# Patient Record
Sex: Female | Born: 1935 | Race: White | Hispanic: No | Smoking: Never smoker
Health system: Southern US, Community
[De-identification: ages and names within clinical notes are randomized; demographics above are authoritative.]

## PROBLEM LIST (undated history)

## (undated) DIAGNOSIS — D509 Iron deficiency anemia, unspecified: Secondary | ICD-10-CM

## (undated) DIAGNOSIS — M5090 Cervical disc disorder, unspecified, unspecified cervical region: Secondary | ICD-10-CM

## (undated) DIAGNOSIS — F411 Generalized anxiety disorder: Secondary | ICD-10-CM

## (undated) DIAGNOSIS — I10 Essential (primary) hypertension: Secondary | ICD-10-CM

## (undated) DIAGNOSIS — M25819 Other specified joint disorders, unspecified shoulder: Secondary | ICD-10-CM

## (undated) DIAGNOSIS — I872 Venous insufficiency (chronic) (peripheral): Secondary | ICD-10-CM

## (undated) DIAGNOSIS — L408 Other psoriasis: Secondary | ICD-10-CM

## (undated) DIAGNOSIS — R609 Edema, unspecified: Secondary | ICD-10-CM

## (undated) DIAGNOSIS — F329 Major depressive disorder, single episode, unspecified: Secondary | ICD-10-CM

## (undated) DIAGNOSIS — N390 Urinary tract infection, site not specified: Secondary | ICD-10-CM

## (undated) DIAGNOSIS — E039 Hypothyroidism, unspecified: Secondary | ICD-10-CM

## (undated) DIAGNOSIS — F3289 Other specified depressive episodes: Secondary | ICD-10-CM

## (undated) DIAGNOSIS — R6881 Early satiety: Secondary | ICD-10-CM

## (undated) DIAGNOSIS — N179 Acute kidney failure, unspecified: Secondary | ICD-10-CM

## (undated) DIAGNOSIS — F0280 Dementia in other diseases classified elsewhere without behavioral disturbance: Secondary | ICD-10-CM

## (undated) DIAGNOSIS — M810 Age-related osteoporosis without current pathological fracture: Secondary | ICD-10-CM

## (undated) DIAGNOSIS — G3109 Other frontotemporal dementia: Secondary | ICD-10-CM

## (undated) DIAGNOSIS — E860 Dehydration: Secondary | ICD-10-CM

## (undated) DIAGNOSIS — R64 Cachexia: Secondary | ICD-10-CM

## (undated) DIAGNOSIS — R9431 Abnormal electrocardiogram [ECG] [EKG]: Secondary | ICD-10-CM

## (undated) DIAGNOSIS — K591 Functional diarrhea: Secondary | ICD-10-CM

## (undated) DIAGNOSIS — L299 Pruritus, unspecified: Secondary | ICD-10-CM

## (undated) HISTORY — DX: Pruritus, unspecified: L29.9

## (undated) HISTORY — DX: Major depressive disorder, single episode, unspecified: F32.9

## (undated) HISTORY — DX: Functional diarrhea: K59.1

## (undated) HISTORY — DX: Iron deficiency anemia, unspecified: D50.9

## (undated) HISTORY — DX: Edema, unspecified: R60.9

## (undated) HISTORY — DX: Essential (primary) hypertension: I10

## (undated) HISTORY — DX: Urinary tract infection, site not specified: N39.0

## (undated) HISTORY — DX: Hypothyroidism, unspecified: E03.9

## (undated) HISTORY — DX: Acute kidney failure, unspecified: N17.9

## (undated) HISTORY — DX: Early satiety: R68.81

## (undated) HISTORY — DX: Cachexia: R64

## (undated) HISTORY — DX: Generalized anxiety disorder: F41.1

## (undated) HISTORY — DX: Dehydration: E86.0

## (undated) HISTORY — DX: Venous insufficiency (chronic) (peripheral): I87.2

## (undated) HISTORY — DX: Age-related osteoporosis without current pathological fracture: M81.0

## (undated) HISTORY — DX: Dementia in other diseases classified elsewhere, unspecified severity, without behavioral disturbance, psychotic disturbance, mood disturbance, and anxiety: F02.80

## (undated) HISTORY — PX: CATARACT EXTRACTION, BILATERAL: SHX1313

## (undated) HISTORY — DX: Cervical disc disorder, unspecified, unspecified cervical region: M50.90

## (undated) HISTORY — DX: Other frontotemporal dementia: G31.09

## (undated) HISTORY — DX: Other specified joint disorders, unspecified shoulder: M25.819

## (undated) HISTORY — DX: Other psoriasis: L40.8

## (undated) HISTORY — DX: Other specified depressive episodes: F32.89

## (undated) HISTORY — DX: Abnormal electrocardiogram (ECG) (EKG): R94.31

---

## 2006-07-11 DIAGNOSIS — M810 Age-related osteoporosis without current pathological fracture: Secondary | ICD-10-CM

## 2006-07-11 DIAGNOSIS — I10 Essential (primary) hypertension: Secondary | ICD-10-CM

## 2007-02-06 DIAGNOSIS — I872 Venous insufficiency (chronic) (peripheral): Secondary | ICD-10-CM

## 2008-07-03 ENCOUNTER — Emergency Department: Payer: Self-pay | Admitting: Emergency Medicine

## 2009-05-10 DIAGNOSIS — E039 Hypothyroidism, unspecified: Secondary | ICD-10-CM

## 2009-05-10 DIAGNOSIS — I8390 Asymptomatic varicose veins of unspecified lower extremity: Secondary | ICD-10-CM

## 2009-10-11 ENCOUNTER — Emergency Department: Payer: Self-pay | Admitting: Emergency Medicine

## 2009-10-19 ENCOUNTER — Emergency Department: Payer: Self-pay | Admitting: Unknown Physician Specialty

## 2010-12-02 ENCOUNTER — Inpatient Hospital Stay: Payer: Self-pay | Admitting: Specialist

## 2011-07-17 ENCOUNTER — Ambulatory Visit: Payer: Self-pay | Admitting: Family Medicine

## 2011-07-17 DIAGNOSIS — M25819 Other specified joint disorders, unspecified shoulder: Secondary | ICD-10-CM | POA: Diagnosis not present

## 2011-07-17 DIAGNOSIS — M810 Age-related osteoporosis without current pathological fracture: Secondary | ICD-10-CM | POA: Diagnosis not present

## 2011-07-17 DIAGNOSIS — M25519 Pain in unspecified shoulder: Secondary | ICD-10-CM | POA: Diagnosis not present

## 2011-07-17 DIAGNOSIS — N390 Urinary tract infection, site not specified: Secondary | ICD-10-CM | POA: Diagnosis not present

## 2011-07-17 DIAGNOSIS — E039 Hypothyroidism, unspecified: Secondary | ICD-10-CM | POA: Diagnosis not present

## 2011-08-09 ENCOUNTER — Ambulatory Visit: Payer: Self-pay | Admitting: Family Medicine

## 2011-08-09 DIAGNOSIS — M810 Age-related osteoporosis without current pathological fracture: Secondary | ICD-10-CM | POA: Diagnosis not present

## 2011-11-16 ENCOUNTER — Inpatient Hospital Stay: Payer: Self-pay | Admitting: Internal Medicine

## 2011-11-16 DIAGNOSIS — E860 Dehydration: Secondary | ICD-10-CM | POA: Diagnosis present

## 2011-11-16 DIAGNOSIS — E41 Nutritional marasmus: Secondary | ICD-10-CM | POA: Diagnosis present

## 2011-11-16 DIAGNOSIS — E876 Hypokalemia: Secondary | ICD-10-CM | POA: Diagnosis present

## 2011-11-16 DIAGNOSIS — R0602 Shortness of breath: Secondary | ICD-10-CM | POA: Diagnosis not present

## 2011-11-16 DIAGNOSIS — Z7983 Long term (current) use of bisphosphonates: Secondary | ICD-10-CM | POA: Diagnosis not present

## 2011-11-16 DIAGNOSIS — D509 Iron deficiency anemia, unspecified: Secondary | ICD-10-CM | POA: Diagnosis not present

## 2011-11-16 DIAGNOSIS — R0902 Hypoxemia: Secondary | ICD-10-CM | POA: Diagnosis not present

## 2011-11-16 DIAGNOSIS — I10 Essential (primary) hypertension: Secondary | ICD-10-CM | POA: Diagnosis present

## 2011-11-16 DIAGNOSIS — M810 Age-related osteoporosis without current pathological fracture: Secondary | ICD-10-CM | POA: Diagnosis present

## 2011-11-16 DIAGNOSIS — J189 Pneumonia, unspecified organism: Secondary | ICD-10-CM | POA: Diagnosis not present

## 2011-11-16 DIAGNOSIS — R64 Cachexia: Secondary | ICD-10-CM | POA: Diagnosis not present

## 2011-11-16 DIAGNOSIS — Z79899 Other long term (current) drug therapy: Secondary | ICD-10-CM | POA: Diagnosis not present

## 2011-11-16 DIAGNOSIS — Z7982 Long term (current) use of aspirin: Secondary | ICD-10-CM | POA: Diagnosis not present

## 2011-11-16 DIAGNOSIS — F329 Major depressive disorder, single episode, unspecified: Secondary | ICD-10-CM | POA: Diagnosis present

## 2011-11-16 DIAGNOSIS — Z8673 Personal history of transient ischemic attack (TIA), and cerebral infarction without residual deficits: Secondary | ICD-10-CM | POA: Diagnosis not present

## 2011-11-16 DIAGNOSIS — F0390 Unspecified dementia without behavioral disturbance: Secondary | ICD-10-CM | POA: Diagnosis present

## 2011-11-16 DIAGNOSIS — I509 Heart failure, unspecified: Secondary | ICD-10-CM | POA: Diagnosis not present

## 2011-11-16 DIAGNOSIS — J810 Acute pulmonary edema: Secondary | ICD-10-CM | POA: Diagnosis not present

## 2011-11-16 DIAGNOSIS — J9600 Acute respiratory failure, unspecified whether with hypoxia or hypercapnia: Secondary | ICD-10-CM | POA: Diagnosis not present

## 2011-11-16 DIAGNOSIS — Z853 Personal history of malignant neoplasm of breast: Secondary | ICD-10-CM | POA: Diagnosis not present

## 2011-11-16 DIAGNOSIS — J90 Pleural effusion, not elsewhere classified: Secondary | ICD-10-CM | POA: Diagnosis not present

## 2011-11-16 DIAGNOSIS — R091 Pleurisy: Secondary | ICD-10-CM | POA: Diagnosis present

## 2011-11-16 DIAGNOSIS — Z23 Encounter for immunization: Secondary | ICD-10-CM | POA: Diagnosis not present

## 2011-11-16 DIAGNOSIS — N6019 Diffuse cystic mastopathy of unspecified breast: Secondary | ICD-10-CM | POA: Diagnosis present

## 2011-11-16 DIAGNOSIS — E039 Hypothyroidism, unspecified: Secondary | ICD-10-CM | POA: Diagnosis present

## 2011-11-16 DIAGNOSIS — D649 Anemia, unspecified: Secondary | ICD-10-CM | POA: Diagnosis not present

## 2011-11-16 DIAGNOSIS — F411 Generalized anxiety disorder: Secondary | ICD-10-CM | POA: Diagnosis present

## 2011-11-16 DIAGNOSIS — J8410 Pulmonary fibrosis, unspecified: Secondary | ICD-10-CM | POA: Diagnosis not present

## 2011-11-16 DIAGNOSIS — R918 Other nonspecific abnormal finding of lung field: Secondary | ICD-10-CM | POA: Diagnosis not present

## 2011-11-16 DIAGNOSIS — Z820 Family history of epilepsy and other diseases of the nervous system: Secondary | ICD-10-CM | POA: Diagnosis not present

## 2011-11-16 DIAGNOSIS — R9389 Abnormal findings on diagnostic imaging of other specified body structures: Secondary | ICD-10-CM | POA: Diagnosis not present

## 2011-11-16 LAB — CK TOTAL AND CKMB (NOT AT ARMC): CK, Total: 46 U/L (ref 21–215)

## 2011-11-16 LAB — COMPREHENSIVE METABOLIC PANEL
Albumin: 2.6 g/dL — ABNORMAL LOW (ref 3.4–5.0)
Alkaline Phosphatase: 144 U/L — ABNORMAL HIGH (ref 50–136)
Anion Gap: 10 (ref 7–16)
BUN: 17 mg/dL (ref 7–18)
Bilirubin,Total: 0.5 mg/dL (ref 0.2–1.0)
Creatinine: 0.81 mg/dL (ref 0.60–1.30)
EGFR (African American): >60
EGFR (Non-African Amer.): >60
Glucose: 79 mg/dL (ref 65–99)
Osmolality: 278 (ref 275–301)
Potassium: 3.5 mmol/L (ref 3.5–5.1)
Sodium: 139 mmol/L (ref 136–145)
Total Protein: 6.4 g/dL (ref 6.4–8.2)

## 2011-11-16 LAB — CBC
HCT: 25 % — ABNORMAL LOW (ref 35.0–47.0)
HGB: 7.9 g/dL — ABNORMAL LOW (ref 12.0–16.0)
MCH: 21.1 pg — ABNORMAL LOW (ref 26.0–34.0)
RBC: 3.76 10*6/uL — ABNORMAL LOW (ref 3.80–5.20)

## 2011-11-16 LAB — URINALYSIS, COMPLETE
Bilirubin,UR: NEGATIVE
Glucose,UR: NEGATIVE mg/dL (ref 0–75)
Ketone: NEGATIVE
Nitrite: NEGATIVE
Protein: 30
RBC,UR: 1 /HPF (ref 0–5)
Specific Gravity: 1.019 (ref 1.003–1.030)
WBC UR: 1 /HPF (ref 0–5)

## 2011-11-16 LAB — TROPONIN I: Troponin-I: <0.02 ng/mL

## 2011-11-17 LAB — CBC WITH DIFFERENTIAL/PLATELET
Basophil %: 1 %
Eosinophil %: 3.8 %
HGB: 7.3 g/dL — ABNORMAL LOW (ref 12.0–16.0)
Lymphocyte #: 0.7 10*3/uL — ABNORMAL LOW (ref 1.0–3.6)
MCV: 67 fL — ABNORMAL LOW (ref 80–100)
Monocyte #: 0.6 x10 3/mm (ref 0.2–0.9)
Monocyte %: 9.4 %
Neutrophil #: 4.8 10*3/uL (ref 1.4–6.5)
Neutrophil %: 75.3 %
Platelet: 435 10*3/uL (ref 150–440)
RBC: 3.47 10*6/uL — ABNORMAL LOW (ref 3.80–5.20)
WBC: 6.3 10*3/uL (ref 3.6–11.0)

## 2011-11-17 LAB — FERRITIN: Ferritin (ARMC): 43 ng/mL (ref 8–388)

## 2011-11-17 LAB — IRON AND TIBC: Unbound Iron-Bind.Cap.: 264 ug/dL

## 2011-11-18 LAB — BASIC METABOLIC PANEL
BUN: 18 mg/dL (ref 7–18)
Calcium, Total: 7.9 mg/dL — ABNORMAL LOW (ref 8.5–10.1)
Chloride: 107 mmol/L (ref 98–107)
Co2: 25 mmol/L (ref 21–32)
Creatinine: 0.89 mg/dL (ref 0.60–1.30)
EGFR (African American): >60
EGFR (Non-African Amer.): >60
Potassium: 3.4 mmol/L — ABNORMAL LOW (ref 3.5–5.1)
Sodium: 140 mmol/L (ref 136–145)

## 2011-11-18 LAB — CBC WITH DIFFERENTIAL/PLATELET
Basophil %: 1 %
Eosinophil %: 2.4 %
HCT: 21.3 % — ABNORMAL LOW (ref 35.0–47.0)
HGB: 7 g/dL — ABNORMAL LOW (ref 12.0–16.0)
Lymphocyte %: 6.4 %
MCH: 21.6 pg — ABNORMAL LOW (ref 26.0–34.0)
Neutrophil #: 4.9 10*3/uL (ref 1.4–6.5)
Neutrophil %: 83.4 %
RBC: 3.22 10*6/uL — ABNORMAL LOW (ref 3.80–5.20)
WBC: 5.9 10*3/uL (ref 3.6–11.0)

## 2011-11-18 LAB — OCCULT BLOOD X 1 CARD TO LAB, STOOL: Occult Blood, Feces: NEGATIVE

## 2011-11-19 LAB — CBC WITH DIFFERENTIAL/PLATELET
Basophil #: 0.1 10*3/uL (ref 0.0–0.1)
Basophil %: 1.2 %
Eosinophil #: 0.3 10*3/uL (ref 0.0–0.7)
HCT: 26.9 % — ABNORMAL LOW (ref 35.0–47.0)
HGB: 8.8 g/dL — ABNORMAL LOW (ref 12.0–16.0)
Lymphocyte #: 0.5 10*3/uL — ABNORMAL LOW (ref 1.0–3.6)
Lymphocyte %: 7.5 %
MCH: 22.7 pg — ABNORMAL LOW (ref 26.0–34.0)
MCHC: 32.9 g/dL (ref 32.0–36.0)
MCV: 69 fL — ABNORMAL LOW (ref 80–100)
Monocyte %: 6.1 %
Neutrophil #: 5 10*3/uL (ref 1.4–6.5)
RDW: 19.7 % — ABNORMAL HIGH (ref 11.5–14.5)
WBC: 6.2 10*3/uL (ref 3.6–11.0)

## 2011-11-19 LAB — BASIC METABOLIC PANEL
Anion Gap: 9 (ref 7–16)
BUN: 15 mg/dL (ref 7–18)
Calcium, Total: 8.2 mg/dL — ABNORMAL LOW (ref 8.5–10.1)
Co2: 27 mmol/L (ref 21–32)
EGFR (Non-African Amer.): >60
Glucose: 86 mg/dL (ref 65–99)
Osmolality: 280 (ref 275–301)
Potassium: 3.2 mmol/L — ABNORMAL LOW (ref 3.5–5.1)

## 2011-11-20 LAB — CBC WITH DIFFERENTIAL/PLATELET
Eosinophil %: 8.5 %
HGB: 9.3 g/dL — ABNORMAL LOW (ref 12.0–16.0)
Lymphocyte #: 0.5 10*3/uL — ABNORMAL LOW (ref 1.0–3.6)
MCV: 69 fL — ABNORMAL LOW (ref 80–100)
Monocyte %: 8 %
Neutrophil #: 3.2 10*3/uL (ref 1.4–6.5)
WBC: 4.6 10*3/uL (ref 3.6–11.0)

## 2011-11-20 LAB — BASIC METABOLIC PANEL
Anion Gap: 6 — ABNORMAL LOW (ref 7–16)
BUN: 18 mg/dL (ref 7–18)
Chloride: 100 mmol/L (ref 98–107)
Creatinine: 0.93 mg/dL (ref 0.60–1.30)
Glucose: 72 mg/dL (ref 65–99)
Osmolality: 278 (ref 275–301)
Sodium: 139 mmol/L (ref 136–145)

## 2011-11-21 LAB — HEMOGLOBIN: HGB: 9.8 g/dL — ABNORMAL LOW (ref 12.0–16.0)

## 2011-11-21 LAB — BASIC METABOLIC PANEL
Anion Gap: 9 (ref 7–16)
BUN: 28 mg/dL — ABNORMAL HIGH (ref 7–18)
Creatinine: 1.02 mg/dL (ref 0.60–1.30)
EGFR (African American): >60
EGFR (Non-African Amer.): 52 — ABNORMAL LOW
Glucose: 83 mg/dL (ref 65–99)
Osmolality: 278 (ref 275–301)
Potassium: 3.4 mmol/L — ABNORMAL LOW (ref 3.5–5.1)

## 2011-11-22 LAB — CULTURE, BLOOD (SINGLE)

## 2011-11-23 DIAGNOSIS — R64 Cachexia: Secondary | ICD-10-CM | POA: Diagnosis not present

## 2011-11-23 DIAGNOSIS — M810 Age-related osteoporosis without current pathological fracture: Secondary | ICD-10-CM | POA: Diagnosis not present

## 2011-11-23 DIAGNOSIS — L8991 Pressure ulcer of unspecified site, stage 1: Secondary | ICD-10-CM | POA: Diagnosis not present

## 2011-11-23 DIAGNOSIS — L89109 Pressure ulcer of unspecified part of back, unspecified stage: Secondary | ICD-10-CM | POA: Diagnosis not present

## 2011-11-23 DIAGNOSIS — I10 Essential (primary) hypertension: Secondary | ICD-10-CM | POA: Diagnosis not present

## 2011-11-23 DIAGNOSIS — D509 Iron deficiency anemia, unspecified: Secondary | ICD-10-CM | POA: Diagnosis not present

## 2011-11-23 DIAGNOSIS — R634 Abnormal weight loss: Secondary | ICD-10-CM | POA: Diagnosis not present

## 2011-11-23 DIAGNOSIS — J189 Pneumonia, unspecified organism: Secondary | ICD-10-CM | POA: Diagnosis not present

## 2011-11-23 DIAGNOSIS — F341 Dysthymic disorder: Secondary | ICD-10-CM | POA: Diagnosis not present

## 2011-11-27 DIAGNOSIS — L8991 Pressure ulcer of unspecified site, stage 1: Secondary | ICD-10-CM | POA: Diagnosis not present

## 2011-11-27 DIAGNOSIS — I10 Essential (primary) hypertension: Secondary | ICD-10-CM | POA: Diagnosis not present

## 2011-11-27 DIAGNOSIS — L89109 Pressure ulcer of unspecified part of back, unspecified stage: Secondary | ICD-10-CM | POA: Diagnosis not present

## 2011-11-27 DIAGNOSIS — D509 Iron deficiency anemia, unspecified: Secondary | ICD-10-CM | POA: Diagnosis not present

## 2011-11-27 DIAGNOSIS — R634 Abnormal weight loss: Secondary | ICD-10-CM | POA: Diagnosis not present

## 2011-11-27 DIAGNOSIS — J189 Pneumonia, unspecified organism: Secondary | ICD-10-CM | POA: Diagnosis not present

## 2011-11-30 DIAGNOSIS — D509 Iron deficiency anemia, unspecified: Secondary | ICD-10-CM | POA: Diagnosis not present

## 2011-11-30 DIAGNOSIS — I10 Essential (primary) hypertension: Secondary | ICD-10-CM | POA: Diagnosis not present

## 2011-11-30 DIAGNOSIS — L8991 Pressure ulcer of unspecified site, stage 1: Secondary | ICD-10-CM | POA: Diagnosis not present

## 2011-11-30 DIAGNOSIS — R634 Abnormal weight loss: Secondary | ICD-10-CM | POA: Diagnosis not present

## 2011-11-30 DIAGNOSIS — J189 Pneumonia, unspecified organism: Secondary | ICD-10-CM | POA: Diagnosis not present

## 2011-11-30 DIAGNOSIS — L89109 Pressure ulcer of unspecified part of back, unspecified stage: Secondary | ICD-10-CM | POA: Diagnosis not present

## 2011-12-06 DIAGNOSIS — L89109 Pressure ulcer of unspecified part of back, unspecified stage: Secondary | ICD-10-CM | POA: Diagnosis not present

## 2011-12-06 DIAGNOSIS — L8991 Pressure ulcer of unspecified site, stage 1: Secondary | ICD-10-CM | POA: Diagnosis not present

## 2011-12-06 DIAGNOSIS — I10 Essential (primary) hypertension: Secondary | ICD-10-CM | POA: Diagnosis not present

## 2011-12-06 DIAGNOSIS — J189 Pneumonia, unspecified organism: Secondary | ICD-10-CM | POA: Diagnosis not present

## 2011-12-06 DIAGNOSIS — D509 Iron deficiency anemia, unspecified: Secondary | ICD-10-CM | POA: Diagnosis not present

## 2011-12-06 DIAGNOSIS — R634 Abnormal weight loss: Secondary | ICD-10-CM | POA: Diagnosis not present

## 2011-12-07 DIAGNOSIS — R197 Diarrhea, unspecified: Secondary | ICD-10-CM | POA: Diagnosis not present

## 2011-12-07 DIAGNOSIS — J189 Pneumonia, unspecified organism: Secondary | ICD-10-CM | POA: Diagnosis not present

## 2011-12-07 DIAGNOSIS — D509 Iron deficiency anemia, unspecified: Secondary | ICD-10-CM | POA: Diagnosis not present

## 2011-12-07 DIAGNOSIS — F329 Major depressive disorder, single episode, unspecified: Secondary | ICD-10-CM | POA: Diagnosis not present

## 2011-12-07 DIAGNOSIS — E039 Hypothyroidism, unspecified: Secondary | ICD-10-CM | POA: Diagnosis not present

## 2011-12-07 DIAGNOSIS — I10 Essential (primary) hypertension: Secondary | ICD-10-CM | POA: Diagnosis not present

## 2011-12-07 DIAGNOSIS — R509 Fever, unspecified: Secondary | ICD-10-CM | POA: Diagnosis not present

## 2011-12-08 DIAGNOSIS — L8991 Pressure ulcer of unspecified site, stage 1: Secondary | ICD-10-CM | POA: Diagnosis not present

## 2011-12-08 DIAGNOSIS — D509 Iron deficiency anemia, unspecified: Secondary | ICD-10-CM | POA: Diagnosis not present

## 2011-12-08 DIAGNOSIS — R634 Abnormal weight loss: Secondary | ICD-10-CM | POA: Diagnosis not present

## 2011-12-08 DIAGNOSIS — L89109 Pressure ulcer of unspecified part of back, unspecified stage: Secondary | ICD-10-CM | POA: Diagnosis not present

## 2011-12-08 DIAGNOSIS — J189 Pneumonia, unspecified organism: Secondary | ICD-10-CM | POA: Diagnosis not present

## 2011-12-08 DIAGNOSIS — I10 Essential (primary) hypertension: Secondary | ICD-10-CM | POA: Diagnosis not present

## 2011-12-15 DIAGNOSIS — D509 Iron deficiency anemia, unspecified: Secondary | ICD-10-CM | POA: Diagnosis not present

## 2011-12-15 DIAGNOSIS — J189 Pneumonia, unspecified organism: Secondary | ICD-10-CM | POA: Diagnosis not present

## 2011-12-15 DIAGNOSIS — I10 Essential (primary) hypertension: Secondary | ICD-10-CM | POA: Diagnosis not present

## 2011-12-15 DIAGNOSIS — L8991 Pressure ulcer of unspecified site, stage 1: Secondary | ICD-10-CM | POA: Diagnosis not present

## 2011-12-15 DIAGNOSIS — L89109 Pressure ulcer of unspecified part of back, unspecified stage: Secondary | ICD-10-CM | POA: Diagnosis not present

## 2011-12-15 DIAGNOSIS — R634 Abnormal weight loss: Secondary | ICD-10-CM | POA: Diagnosis not present

## 2011-12-16 DIAGNOSIS — L89109 Pressure ulcer of unspecified part of back, unspecified stage: Secondary | ICD-10-CM | POA: Diagnosis not present

## 2011-12-16 DIAGNOSIS — D509 Iron deficiency anemia, unspecified: Secondary | ICD-10-CM | POA: Diagnosis not present

## 2011-12-16 DIAGNOSIS — R634 Abnormal weight loss: Secondary | ICD-10-CM | POA: Diagnosis not present

## 2011-12-16 DIAGNOSIS — I10 Essential (primary) hypertension: Secondary | ICD-10-CM | POA: Diagnosis not present

## 2011-12-16 DIAGNOSIS — L8991 Pressure ulcer of unspecified site, stage 1: Secondary | ICD-10-CM | POA: Diagnosis not present

## 2011-12-16 DIAGNOSIS — J189 Pneumonia, unspecified organism: Secondary | ICD-10-CM | POA: Diagnosis not present

## 2011-12-19 DIAGNOSIS — J189 Pneumonia, unspecified organism: Secondary | ICD-10-CM | POA: Diagnosis not present

## 2011-12-19 DIAGNOSIS — L8991 Pressure ulcer of unspecified site, stage 1: Secondary | ICD-10-CM | POA: Diagnosis not present

## 2011-12-19 DIAGNOSIS — D509 Iron deficiency anemia, unspecified: Secondary | ICD-10-CM | POA: Diagnosis not present

## 2011-12-19 DIAGNOSIS — R634 Abnormal weight loss: Secondary | ICD-10-CM | POA: Diagnosis not present

## 2011-12-19 DIAGNOSIS — L89109 Pressure ulcer of unspecified part of back, unspecified stage: Secondary | ICD-10-CM | POA: Diagnosis not present

## 2011-12-19 DIAGNOSIS — I10 Essential (primary) hypertension: Secondary | ICD-10-CM | POA: Diagnosis not present

## 2011-12-25 DIAGNOSIS — L89109 Pressure ulcer of unspecified part of back, unspecified stage: Secondary | ICD-10-CM | POA: Diagnosis not present

## 2011-12-25 DIAGNOSIS — I10 Essential (primary) hypertension: Secondary | ICD-10-CM | POA: Diagnosis not present

## 2011-12-25 DIAGNOSIS — D509 Iron deficiency anemia, unspecified: Secondary | ICD-10-CM | POA: Diagnosis not present

## 2011-12-25 DIAGNOSIS — L8991 Pressure ulcer of unspecified site, stage 1: Secondary | ICD-10-CM | POA: Diagnosis not present

## 2011-12-25 DIAGNOSIS — J189 Pneumonia, unspecified organism: Secondary | ICD-10-CM | POA: Diagnosis not present

## 2011-12-25 DIAGNOSIS — R634 Abnormal weight loss: Secondary | ICD-10-CM | POA: Diagnosis not present

## 2012-01-09 ENCOUNTER — Encounter: Payer: Self-pay | Admitting: *Deleted

## 2012-01-19 ENCOUNTER — Ambulatory Visit: Payer: Self-pay | Admitting: Cardiovascular Disease

## 2013-02-28 DIAGNOSIS — D509 Iron deficiency anemia, unspecified: Secondary | ICD-10-CM | POA: Diagnosis not present

## 2013-02-28 DIAGNOSIS — K144 Atrophy of tongue papillae: Secondary | ICD-10-CM | POA: Diagnosis not present

## 2013-02-28 DIAGNOSIS — F329 Major depressive disorder, single episode, unspecified: Secondary | ICD-10-CM | POA: Diagnosis not present

## 2013-02-28 DIAGNOSIS — E039 Hypothyroidism, unspecified: Secondary | ICD-10-CM | POA: Diagnosis not present

## 2013-02-28 DIAGNOSIS — R609 Edema, unspecified: Secondary | ICD-10-CM | POA: Diagnosis not present

## 2013-02-28 DIAGNOSIS — M810 Age-related osteoporosis without current pathological fracture: Secondary | ICD-10-CM | POA: Diagnosis not present

## 2013-02-28 DIAGNOSIS — Z23 Encounter for immunization: Secondary | ICD-10-CM | POA: Diagnosis not present

## 2013-02-28 DIAGNOSIS — I10 Essential (primary) hypertension: Secondary | ICD-10-CM | POA: Diagnosis not present

## 2013-02-28 DIAGNOSIS — R21 Rash and other nonspecific skin eruption: Secondary | ICD-10-CM | POA: Diagnosis not present

## 2013-06-26 DIAGNOSIS — E038 Other specified hypothyroidism: Secondary | ICD-10-CM | POA: Diagnosis not present

## 2013-06-26 DIAGNOSIS — Z760 Encounter for issue of repeat prescription: Secondary | ICD-10-CM | POA: Diagnosis not present

## 2013-07-08 DIAGNOSIS — H349 Unspecified retinal vascular occlusion: Secondary | ICD-10-CM | POA: Diagnosis not present

## 2013-07-11 DIAGNOSIS — H43819 Vitreous degeneration, unspecified eye: Secondary | ICD-10-CM | POA: Diagnosis not present

## 2013-07-11 DIAGNOSIS — H348192 Central retinal vein occlusion, unspecified eye, stable: Secondary | ICD-10-CM | POA: Diagnosis not present

## 2013-07-11 DIAGNOSIS — H35359 Cystoid macular degeneration, unspecified eye: Secondary | ICD-10-CM | POA: Diagnosis not present

## 2013-08-08 DIAGNOSIS — L0100 Impetigo, unspecified: Secondary | ICD-10-CM | POA: Diagnosis not present

## 2013-08-08 DIAGNOSIS — L259 Unspecified contact dermatitis, unspecified cause: Secondary | ICD-10-CM | POA: Diagnosis not present

## 2013-08-18 DIAGNOSIS — H348192 Central retinal vein occlusion, unspecified eye, stable: Secondary | ICD-10-CM | POA: Diagnosis not present

## 2013-10-13 DIAGNOSIS — H3560 Retinal hemorrhage, unspecified eye: Secondary | ICD-10-CM | POA: Diagnosis not present

## 2013-12-16 DIAGNOSIS — E039 Hypothyroidism, unspecified: Secondary | ICD-10-CM | POA: Diagnosis not present

## 2013-12-16 DIAGNOSIS — D473 Essential (hemorrhagic) thrombocythemia: Secondary | ICD-10-CM | POA: Diagnosis not present

## 2013-12-16 DIAGNOSIS — G3109 Other frontotemporal dementia: Secondary | ICD-10-CM | POA: Diagnosis not present

## 2013-12-16 DIAGNOSIS — M810 Age-related osteoporosis without current pathological fracture: Secondary | ICD-10-CM | POA: Diagnosis not present

## 2013-12-16 DIAGNOSIS — I10 Essential (primary) hypertension: Secondary | ICD-10-CM | POA: Diagnosis not present

## 2013-12-16 DIAGNOSIS — L309 Dermatitis, unspecified: Secondary | ICD-10-CM | POA: Diagnosis not present

## 2013-12-16 DIAGNOSIS — D649 Anemia, unspecified: Secondary | ICD-10-CM | POA: Diagnosis not present

## 2013-12-16 DIAGNOSIS — K148 Other diseases of tongue: Secondary | ICD-10-CM | POA: Diagnosis not present

## 2013-12-16 DIAGNOSIS — F0280 Dementia in other diseases classified elsewhere without behavioral disturbance: Secondary | ICD-10-CM | POA: Diagnosis not present

## 2013-12-16 DIAGNOSIS — Z23 Encounter for immunization: Secondary | ICD-10-CM | POA: Diagnosis not present

## 2013-12-17 ENCOUNTER — Inpatient Hospital Stay: Payer: Self-pay | Admitting: Internal Medicine

## 2013-12-17 DIAGNOSIS — Z882 Allergy status to sulfonamides status: Secondary | ICD-10-CM | POA: Diagnosis not present

## 2013-12-17 DIAGNOSIS — R079 Chest pain, unspecified: Secondary | ICD-10-CM | POA: Diagnosis not present

## 2013-12-17 DIAGNOSIS — D649 Anemia, unspecified: Secondary | ICD-10-CM | POA: Diagnosis not present

## 2013-12-17 DIAGNOSIS — E039 Hypothyroidism, unspecified: Secondary | ICD-10-CM | POA: Diagnosis not present

## 2013-12-17 DIAGNOSIS — J702 Acute drug-induced interstitial lung disorders: Secondary | ICD-10-CM | POA: Diagnosis not present

## 2013-12-17 DIAGNOSIS — T458X5A Adverse effect of other primarily systemic and hematological agents, initial encounter: Secondary | ICD-10-CM | POA: Diagnosis not present

## 2013-12-17 DIAGNOSIS — K921 Melena: Secondary | ICD-10-CM | POA: Diagnosis present

## 2013-12-17 DIAGNOSIS — R5383 Other fatigue: Secondary | ICD-10-CM | POA: Diagnosis not present

## 2013-12-17 DIAGNOSIS — G309 Alzheimer's disease, unspecified: Secondary | ICD-10-CM | POA: Diagnosis not present

## 2013-12-17 DIAGNOSIS — R0602 Shortness of breath: Secondary | ICD-10-CM | POA: Diagnosis not present

## 2013-12-17 DIAGNOSIS — Z8673 Personal history of transient ischemic attack (TIA), and cerebral infarction without residual deficits: Secondary | ICD-10-CM | POA: Diagnosis not present

## 2013-12-17 DIAGNOSIS — J90 Pleural effusion, not elsewhere classified: Secondary | ICD-10-CM | POA: Diagnosis not present

## 2013-12-17 DIAGNOSIS — F0390 Unspecified dementia without behavioral disturbance: Secondary | ICD-10-CM | POA: Diagnosis not present

## 2013-12-17 DIAGNOSIS — K625 Hemorrhage of anus and rectum: Secondary | ICD-10-CM | POA: Diagnosis not present

## 2013-12-17 DIAGNOSIS — J189 Pneumonia, unspecified organism: Secondary | ICD-10-CM | POA: Diagnosis not present

## 2013-12-17 DIAGNOSIS — E8771 Transfusion associated circulatory overload: Secondary | ICD-10-CM | POA: Diagnosis not present

## 2013-12-17 DIAGNOSIS — J439 Emphysema, unspecified: Secondary | ICD-10-CM | POA: Diagnosis present

## 2013-12-17 DIAGNOSIS — D509 Iron deficiency anemia, unspecified: Secondary | ICD-10-CM | POA: Diagnosis present

## 2013-12-17 DIAGNOSIS — T50995A Adverse effect of other drugs, medicaments and biological substances, initial encounter: Secondary | ICD-10-CM | POA: Diagnosis not present

## 2013-12-17 DIAGNOSIS — K922 Gastrointestinal hemorrhage, unspecified: Secondary | ICD-10-CM | POA: Diagnosis not present

## 2013-12-17 DIAGNOSIS — I517 Cardiomegaly: Secondary | ICD-10-CM | POA: Diagnosis not present

## 2013-12-17 DIAGNOSIS — F0280 Dementia in other diseases classified elsewhere without behavioral disturbance: Secondary | ICD-10-CM | POA: Diagnosis present

## 2013-12-17 DIAGNOSIS — D62 Acute posthemorrhagic anemia: Secondary | ICD-10-CM | POA: Diagnosis present

## 2013-12-17 DIAGNOSIS — I10 Essential (primary) hypertension: Secondary | ICD-10-CM | POA: Diagnosis not present

## 2013-12-17 DIAGNOSIS — J810 Acute pulmonary edema: Secondary | ICD-10-CM | POA: Diagnosis not present

## 2013-12-17 LAB — COMPREHENSIVE METABOLIC PANEL
ALK PHOS: 92 U/L
ALT: 18 U/L
ANION GAP: 7 (ref 7–16)
AST: 23 U/L (ref 15–37)
Albumin: 3 g/dL — ABNORMAL LOW (ref 3.4–5.0)
BILIRUBIN TOTAL: 0.3 mg/dL (ref 0.2–1.0)
BUN: 18 mg/dL (ref 7–18)
CALCIUM: 7.9 mg/dL — AB (ref 8.5–10.1)
Chloride: 109 mmol/L — ABNORMAL HIGH (ref 98–107)
Co2: 26 mmol/L (ref 21–32)
Creatinine: 1.18 mg/dL (ref 0.60–1.30)
EGFR (African American): 57 — ABNORMAL LOW
GFR CALC NON AF AMER: 47 — AB
Glucose: 90 mg/dL (ref 65–99)
Osmolality: 285 (ref 275–301)
Potassium: 3.6 mmol/L (ref 3.5–5.1)
Sodium: 142 mmol/L (ref 136–145)
TOTAL PROTEIN: 6.5 g/dL (ref 6.4–8.2)

## 2013-12-17 LAB — IRON AND TIBC
IRON BIND. CAP.(TOTAL): 393 ug/dL (ref 250–450)
Iron Saturation: 2 %
Iron: 8 ug/dL — ABNORMAL LOW (ref 50–170)
UNBOUND IRON-BIND. CAP.: 385 ug/dL

## 2013-12-17 LAB — CBC
HCT: 19.4 % — ABNORMAL LOW (ref 35.0–47.0)
HGB: 5.5 g/dL — AB (ref 12.0–16.0)
MCH: 19.2 pg — ABNORMAL LOW (ref 26.0–34.0)
MCHC: 28.2 g/dL — ABNORMAL LOW (ref 32.0–36.0)
MCV: 68 fL — ABNORMAL LOW (ref 80–100)
PLATELETS: 459 10*3/uL — AB (ref 150–440)
RBC: 2.85 10*6/uL — ABNORMAL LOW (ref 3.80–5.20)
RDW: 16.7 % — AB (ref 11.5–14.5)
WBC: 7 10*3/uL (ref 3.6–11.0)

## 2013-12-17 LAB — PROTIME-INR
INR: 1.1
PROTHROMBIN TIME: 14.5 s (ref 11.5–14.7)

## 2013-12-17 LAB — FERRITIN: Ferritin (ARMC): 6 ng/mL — ABNORMAL LOW (ref 8–388)

## 2013-12-17 LAB — HEMOGLOBIN: HGB: 4.9 g/dL — AB (ref 12.0–16.0)

## 2013-12-17 LAB — TROPONIN I: Troponin-I: <0.02 ng/mL

## 2013-12-17 LAB — APTT: Activated PTT: 30.1 secs (ref 23.6–35.9)

## 2013-12-18 DIAGNOSIS — F0390 Unspecified dementia without behavioral disturbance: Secondary | ICD-10-CM | POA: Diagnosis not present

## 2013-12-18 DIAGNOSIS — R0602 Shortness of breath: Secondary | ICD-10-CM | POA: Diagnosis not present

## 2013-12-18 DIAGNOSIS — K625 Hemorrhage of anus and rectum: Secondary | ICD-10-CM | POA: Diagnosis not present

## 2013-12-18 DIAGNOSIS — R5383 Other fatigue: Secondary | ICD-10-CM | POA: Diagnosis not present

## 2013-12-18 DIAGNOSIS — E039 Hypothyroidism, unspecified: Secondary | ICD-10-CM | POA: Diagnosis not present

## 2013-12-18 DIAGNOSIS — D649 Anemia, unspecified: Secondary | ICD-10-CM | POA: Diagnosis not present

## 2013-12-18 DIAGNOSIS — G309 Alzheimer's disease, unspecified: Secondary | ICD-10-CM | POA: Diagnosis not present

## 2013-12-18 LAB — BASIC METABOLIC PANEL
Anion Gap: 7 (ref 7–16)
BUN: 19 mg/dL — AB (ref 7–18)
CHLORIDE: 112 mmol/L — AB (ref 98–107)
CREATININE: 1.08 mg/dL (ref 0.60–1.30)
Calcium, Total: 8.1 mg/dL — ABNORMAL LOW (ref 8.5–10.1)
Co2: 25 mmol/L (ref 21–32)
EGFR (African American): >60
EGFR (Non-African Amer.): 52 — ABNORMAL LOW
Glucose: 86 mg/dL (ref 65–99)
Osmolality: 288 (ref 275–301)
Potassium: 4 mmol/L (ref 3.5–5.1)
Sodium: 144 mmol/L (ref 136–145)

## 2013-12-18 LAB — CBC WITH DIFFERENTIAL/PLATELET
Basophil #: 0.1 10*3/uL (ref 0.0–0.1)
Basophil %: 1.4 %
Eosinophil #: 0.8 10*3/uL — ABNORMAL HIGH (ref 0.0–0.7)
Eosinophil %: 12.2 %
HCT: 22.3 % — AB (ref 35.0–47.0)
HGB: 6.9 g/dL — ABNORMAL LOW (ref 12.0–16.0)
LYMPHS ABS: 0.8 10*3/uL — AB (ref 1.0–3.6)
LYMPHS PCT: 11.2 %
MCH: 21.8 pg — ABNORMAL LOW (ref 26.0–34.0)
MCHC: 31 g/dL — ABNORMAL LOW (ref 32.0–36.0)
MCV: 70 fL — AB (ref 80–100)
Monocyte #: 0.5 x10 3/mm (ref 0.2–0.9)
Monocyte %: 7.9 %
Neutrophil #: 4.6 10*3/uL (ref 1.4–6.5)
Neutrophil %: 67.3 %
PLATELETS: 444 10*3/uL — AB (ref 150–440)
RBC: 3.17 10*6/uL — ABNORMAL LOW (ref 3.80–5.20)
RDW: 18.8 % — AB (ref 11.5–14.5)
WBC: 6.9 10*3/uL (ref 3.6–11.0)

## 2013-12-19 DIAGNOSIS — R5383 Other fatigue: Secondary | ICD-10-CM | POA: Diagnosis not present

## 2013-12-19 DIAGNOSIS — F0390 Unspecified dementia without behavioral disturbance: Secondary | ICD-10-CM | POA: Diagnosis not present

## 2013-12-19 DIAGNOSIS — I517 Cardiomegaly: Secondary | ICD-10-CM | POA: Diagnosis not present

## 2013-12-19 DIAGNOSIS — K625 Hemorrhage of anus and rectum: Secondary | ICD-10-CM | POA: Diagnosis not present

## 2013-12-19 DIAGNOSIS — G309 Alzheimer's disease, unspecified: Secondary | ICD-10-CM | POA: Diagnosis not present

## 2013-12-19 DIAGNOSIS — D649 Anemia, unspecified: Secondary | ICD-10-CM | POA: Diagnosis not present

## 2013-12-19 DIAGNOSIS — E039 Hypothyroidism, unspecified: Secondary | ICD-10-CM | POA: Diagnosis not present

## 2013-12-19 DIAGNOSIS — J189 Pneumonia, unspecified organism: Secondary | ICD-10-CM | POA: Diagnosis not present

## 2013-12-19 DIAGNOSIS — R0602 Shortness of breath: Secondary | ICD-10-CM | POA: Diagnosis not present

## 2013-12-19 DIAGNOSIS — J90 Pleural effusion, not elsewhere classified: Secondary | ICD-10-CM | POA: Diagnosis not present

## 2013-12-19 LAB — HEMOGLOBIN: HGB: 8.1 g/dL — ABNORMAL LOW (ref 12.0–16.0)

## 2013-12-20 DIAGNOSIS — R079 Chest pain, unspecified: Secondary | ICD-10-CM | POA: Diagnosis not present

## 2013-12-20 DIAGNOSIS — F0390 Unspecified dementia without behavioral disturbance: Secondary | ICD-10-CM | POA: Diagnosis not present

## 2013-12-20 DIAGNOSIS — G309 Alzheimer's disease, unspecified: Secondary | ICD-10-CM | POA: Diagnosis not present

## 2013-12-20 DIAGNOSIS — D649 Anemia, unspecified: Secondary | ICD-10-CM | POA: Diagnosis not present

## 2013-12-20 DIAGNOSIS — R5383 Other fatigue: Secondary | ICD-10-CM | POA: Diagnosis not present

## 2013-12-20 DIAGNOSIS — R0602 Shortness of breath: Secondary | ICD-10-CM | POA: Diagnosis not present

## 2013-12-20 DIAGNOSIS — E039 Hypothyroidism, unspecified: Secondary | ICD-10-CM | POA: Diagnosis not present

## 2013-12-20 DIAGNOSIS — K625 Hemorrhage of anus and rectum: Secondary | ICD-10-CM | POA: Diagnosis not present

## 2013-12-20 LAB — OCCULT BLOOD X 1 CARD TO LAB, STOOL: Occult Blood, Feces: POSITIVE

## 2013-12-21 DIAGNOSIS — R5383 Other fatigue: Secondary | ICD-10-CM | POA: Diagnosis not present

## 2013-12-21 DIAGNOSIS — R0602 Shortness of breath: Secondary | ICD-10-CM | POA: Diagnosis not present

## 2013-12-21 DIAGNOSIS — K625 Hemorrhage of anus and rectum: Secondary | ICD-10-CM | POA: Diagnosis not present

## 2013-12-21 DIAGNOSIS — F0390 Unspecified dementia without behavioral disturbance: Secondary | ICD-10-CM | POA: Diagnosis not present

## 2013-12-21 DIAGNOSIS — G309 Alzheimer's disease, unspecified: Secondary | ICD-10-CM | POA: Diagnosis not present

## 2013-12-21 DIAGNOSIS — D649 Anemia, unspecified: Secondary | ICD-10-CM | POA: Diagnosis not present

## 2013-12-21 DIAGNOSIS — E039 Hypothyroidism, unspecified: Secondary | ICD-10-CM | POA: Diagnosis not present

## 2013-12-21 LAB — CBC WITH DIFFERENTIAL/PLATELET
BASOS ABS: 0 10*3/uL (ref 0.0–0.1)
BASOS PCT: 0.5 %
EOS PCT: 3.2 %
Eosinophil #: 0.3 10*3/uL (ref 0.0–0.7)
HCT: 25.2 % — ABNORMAL LOW (ref 35.0–47.0)
HGB: 7.7 g/dL — AB (ref 12.0–16.0)
Lymphocyte #: 0.6 10*3/uL — ABNORMAL LOW (ref 1.0–3.6)
Lymphocyte %: 6.3 %
MCH: 21.4 pg — ABNORMAL LOW (ref 26.0–34.0)
MCHC: 30.3 g/dL — AB (ref 32.0–36.0)
MCV: 71 fL — ABNORMAL LOW (ref 80–100)
MONO ABS: 0.9 x10 3/mm (ref 0.2–0.9)
MONOS PCT: 9.6 %
NEUTROS ABS: 7.6 10*3/uL — AB (ref 1.4–6.5)
NEUTROS PCT: 80.4 %
Platelet: 381 10*3/uL (ref 150–440)
RBC: 3.57 10*6/uL — ABNORMAL LOW (ref 3.80–5.20)
RDW: 21.6 % — ABNORMAL HIGH (ref 11.5–14.5)
WBC: 9.4 10*3/uL (ref 3.6–11.0)

## 2013-12-21 LAB — TROPONIN I

## 2013-12-24 DIAGNOSIS — Z8673 Personal history of transient ischemic attack (TIA), and cerebral infarction without residual deficits: Secondary | ICD-10-CM | POA: Diagnosis not present

## 2013-12-24 DIAGNOSIS — F0391 Unspecified dementia with behavioral disturbance: Secondary | ICD-10-CM | POA: Diagnosis not present

## 2013-12-24 DIAGNOSIS — R279 Unspecified lack of coordination: Secondary | ICD-10-CM | POA: Diagnosis not present

## 2013-12-24 DIAGNOSIS — K922 Gastrointestinal hemorrhage, unspecified: Secondary | ICD-10-CM | POA: Diagnosis not present

## 2013-12-24 DIAGNOSIS — R531 Weakness: Secondary | ICD-10-CM | POA: Diagnosis not present

## 2013-12-24 DIAGNOSIS — R2689 Other abnormalities of gait and mobility: Secondary | ICD-10-CM | POA: Diagnosis not present

## 2013-12-24 DIAGNOSIS — D500 Iron deficiency anemia secondary to blood loss (chronic): Secondary | ICD-10-CM | POA: Diagnosis not present

## 2013-12-25 DIAGNOSIS — R2689 Other abnormalities of gait and mobility: Secondary | ICD-10-CM | POA: Diagnosis not present

## 2013-12-25 DIAGNOSIS — K922 Gastrointestinal hemorrhage, unspecified: Secondary | ICD-10-CM | POA: Diagnosis not present

## 2013-12-25 DIAGNOSIS — D500 Iron deficiency anemia secondary to blood loss (chronic): Secondary | ICD-10-CM | POA: Diagnosis not present

## 2013-12-25 DIAGNOSIS — F0391 Unspecified dementia with behavioral disturbance: Secondary | ICD-10-CM | POA: Diagnosis not present

## 2013-12-25 DIAGNOSIS — R279 Unspecified lack of coordination: Secondary | ICD-10-CM | POA: Diagnosis not present

## 2013-12-25 DIAGNOSIS — R531 Weakness: Secondary | ICD-10-CM | POA: Diagnosis not present

## 2013-12-29 DIAGNOSIS — Z09 Encounter for follow-up examination after completed treatment for conditions other than malignant neoplasm: Secondary | ICD-10-CM | POA: Diagnosis not present

## 2013-12-29 DIAGNOSIS — J811 Chronic pulmonary edema: Secondary | ICD-10-CM | POA: Diagnosis not present

## 2013-12-29 DIAGNOSIS — E875 Hyperkalemia: Secondary | ICD-10-CM | POA: Diagnosis not present

## 2013-12-29 DIAGNOSIS — N183 Chronic kidney disease, stage 3 (moderate): Secondary | ICD-10-CM | POA: Diagnosis not present

## 2013-12-29 DIAGNOSIS — D500 Iron deficiency anemia secondary to blood loss (chronic): Secondary | ICD-10-CM | POA: Diagnosis not present

## 2013-12-30 DIAGNOSIS — R531 Weakness: Secondary | ICD-10-CM | POA: Diagnosis not present

## 2013-12-30 DIAGNOSIS — F0391 Unspecified dementia with behavioral disturbance: Secondary | ICD-10-CM | POA: Diagnosis not present

## 2013-12-30 DIAGNOSIS — D500 Iron deficiency anemia secondary to blood loss (chronic): Secondary | ICD-10-CM | POA: Diagnosis not present

## 2013-12-30 DIAGNOSIS — R279 Unspecified lack of coordination: Secondary | ICD-10-CM | POA: Diagnosis not present

## 2013-12-30 DIAGNOSIS — R2689 Other abnormalities of gait and mobility: Secondary | ICD-10-CM | POA: Diagnosis not present

## 2013-12-30 DIAGNOSIS — K922 Gastrointestinal hemorrhage, unspecified: Secondary | ICD-10-CM | POA: Diagnosis not present

## 2014-01-01 DIAGNOSIS — R279 Unspecified lack of coordination: Secondary | ICD-10-CM | POA: Diagnosis not present

## 2014-01-01 DIAGNOSIS — F0391 Unspecified dementia with behavioral disturbance: Secondary | ICD-10-CM | POA: Diagnosis not present

## 2014-01-01 DIAGNOSIS — K922 Gastrointestinal hemorrhage, unspecified: Secondary | ICD-10-CM | POA: Diagnosis not present

## 2014-01-01 DIAGNOSIS — D500 Iron deficiency anemia secondary to blood loss (chronic): Secondary | ICD-10-CM | POA: Diagnosis not present

## 2014-01-01 DIAGNOSIS — R2689 Other abnormalities of gait and mobility: Secondary | ICD-10-CM | POA: Diagnosis not present

## 2014-01-01 DIAGNOSIS — R531 Weakness: Secondary | ICD-10-CM | POA: Diagnosis not present

## 2014-01-05 DIAGNOSIS — R279 Unspecified lack of coordination: Secondary | ICD-10-CM | POA: Diagnosis not present

## 2014-01-05 DIAGNOSIS — F0391 Unspecified dementia with behavioral disturbance: Secondary | ICD-10-CM | POA: Diagnosis not present

## 2014-01-05 DIAGNOSIS — R2689 Other abnormalities of gait and mobility: Secondary | ICD-10-CM | POA: Diagnosis not present

## 2014-01-05 DIAGNOSIS — K922 Gastrointestinal hemorrhage, unspecified: Secondary | ICD-10-CM | POA: Diagnosis not present

## 2014-01-05 DIAGNOSIS — R531 Weakness: Secondary | ICD-10-CM | POA: Diagnosis not present

## 2014-01-05 DIAGNOSIS — D500 Iron deficiency anemia secondary to blood loss (chronic): Secondary | ICD-10-CM | POA: Diagnosis not present

## 2014-01-06 DIAGNOSIS — R279 Unspecified lack of coordination: Secondary | ICD-10-CM | POA: Diagnosis not present

## 2014-01-06 DIAGNOSIS — R2689 Other abnormalities of gait and mobility: Secondary | ICD-10-CM | POA: Diagnosis not present

## 2014-01-06 DIAGNOSIS — F0391 Unspecified dementia with behavioral disturbance: Secondary | ICD-10-CM | POA: Diagnosis not present

## 2014-01-06 DIAGNOSIS — R531 Weakness: Secondary | ICD-10-CM | POA: Diagnosis not present

## 2014-01-06 DIAGNOSIS — D500 Iron deficiency anemia secondary to blood loss (chronic): Secondary | ICD-10-CM | POA: Diagnosis not present

## 2014-01-06 DIAGNOSIS — K922 Gastrointestinal hemorrhage, unspecified: Secondary | ICD-10-CM | POA: Diagnosis not present

## 2014-01-09 DIAGNOSIS — K922 Gastrointestinal hemorrhage, unspecified: Secondary | ICD-10-CM | POA: Diagnosis not present

## 2014-01-09 DIAGNOSIS — R279 Unspecified lack of coordination: Secondary | ICD-10-CM | POA: Diagnosis not present

## 2014-01-09 DIAGNOSIS — R2689 Other abnormalities of gait and mobility: Secondary | ICD-10-CM | POA: Diagnosis not present

## 2014-01-09 DIAGNOSIS — R531 Weakness: Secondary | ICD-10-CM | POA: Diagnosis not present

## 2014-01-09 DIAGNOSIS — D500 Iron deficiency anemia secondary to blood loss (chronic): Secondary | ICD-10-CM | POA: Diagnosis not present

## 2014-01-09 DIAGNOSIS — F0391 Unspecified dementia with behavioral disturbance: Secondary | ICD-10-CM | POA: Diagnosis not present

## 2014-01-12 DIAGNOSIS — H349 Unspecified retinal vascular occlusion: Secondary | ICD-10-CM | POA: Diagnosis not present

## 2014-01-13 DIAGNOSIS — R2689 Other abnormalities of gait and mobility: Secondary | ICD-10-CM | POA: Diagnosis not present

## 2014-01-13 DIAGNOSIS — R531 Weakness: Secondary | ICD-10-CM | POA: Diagnosis not present

## 2014-01-13 DIAGNOSIS — R279 Unspecified lack of coordination: Secondary | ICD-10-CM | POA: Diagnosis not present

## 2014-01-13 DIAGNOSIS — K922 Gastrointestinal hemorrhage, unspecified: Secondary | ICD-10-CM | POA: Diagnosis not present

## 2014-01-13 DIAGNOSIS — D500 Iron deficiency anemia secondary to blood loss (chronic): Secondary | ICD-10-CM | POA: Diagnosis not present

## 2014-01-13 DIAGNOSIS — F0391 Unspecified dementia with behavioral disturbance: Secondary | ICD-10-CM | POA: Diagnosis not present

## 2014-01-15 DIAGNOSIS — R531 Weakness: Secondary | ICD-10-CM | POA: Diagnosis not present

## 2014-01-15 DIAGNOSIS — R279 Unspecified lack of coordination: Secondary | ICD-10-CM | POA: Diagnosis not present

## 2014-01-15 DIAGNOSIS — K922 Gastrointestinal hemorrhage, unspecified: Secondary | ICD-10-CM | POA: Diagnosis not present

## 2014-01-15 DIAGNOSIS — R2689 Other abnormalities of gait and mobility: Secondary | ICD-10-CM | POA: Diagnosis not present

## 2014-01-15 DIAGNOSIS — F0391 Unspecified dementia with behavioral disturbance: Secondary | ICD-10-CM | POA: Diagnosis not present

## 2014-01-15 DIAGNOSIS — D500 Iron deficiency anemia secondary to blood loss (chronic): Secondary | ICD-10-CM | POA: Diagnosis not present

## 2014-01-23 DIAGNOSIS — L309 Dermatitis, unspecified: Secondary | ICD-10-CM | POA: Diagnosis not present

## 2014-01-27 DIAGNOSIS — R531 Weakness: Secondary | ICD-10-CM | POA: Diagnosis not present

## 2014-01-27 DIAGNOSIS — K922 Gastrointestinal hemorrhage, unspecified: Secondary | ICD-10-CM | POA: Diagnosis not present

## 2014-01-27 DIAGNOSIS — R279 Unspecified lack of coordination: Secondary | ICD-10-CM | POA: Diagnosis not present

## 2014-01-27 DIAGNOSIS — D500 Iron deficiency anemia secondary to blood loss (chronic): Secondary | ICD-10-CM | POA: Diagnosis not present

## 2014-01-27 DIAGNOSIS — R2689 Other abnormalities of gait and mobility: Secondary | ICD-10-CM | POA: Diagnosis not present

## 2014-01-27 DIAGNOSIS — F0391 Unspecified dementia with behavioral disturbance: Secondary | ICD-10-CM | POA: Diagnosis not present

## 2014-02-03 DIAGNOSIS — E039 Hypothyroidism, unspecified: Secondary | ICD-10-CM | POA: Diagnosis not present

## 2014-02-03 DIAGNOSIS — I10 Essential (primary) hypertension: Secondary | ICD-10-CM | POA: Diagnosis not present

## 2014-02-03 DIAGNOSIS — D500 Iron deficiency anemia secondary to blood loss (chronic): Secondary | ICD-10-CM | POA: Diagnosis not present

## 2014-02-03 DIAGNOSIS — L309 Dermatitis, unspecified: Secondary | ICD-10-CM | POA: Diagnosis not present

## 2014-05-25 DIAGNOSIS — H34812 Central retinal vein occlusion, left eye: Secondary | ICD-10-CM | POA: Diagnosis not present

## 2014-05-27 DIAGNOSIS — H34812 Central retinal vein occlusion, left eye: Secondary | ICD-10-CM | POA: Diagnosis not present

## 2014-06-03 DIAGNOSIS — H349 Unspecified retinal vascular occlusion: Secondary | ICD-10-CM | POA: Diagnosis not present

## 2014-06-22 ENCOUNTER — Ambulatory Visit
Admit: 2014-06-22 | Disposition: A | Discharge status: Home / Self Care | Payer: Self-pay | Attending: Ophthalmology | Admitting: Ophthalmology

## 2014-06-22 DIAGNOSIS — F329 Major depressive disorder, single episode, unspecified: Secondary | ICD-10-CM | POA: Diagnosis not present

## 2014-06-22 DIAGNOSIS — Z8673 Personal history of transient ischemic attack (TIA), and cerebral infarction without residual deficits: Secondary | ICD-10-CM | POA: Diagnosis not present

## 2014-06-22 DIAGNOSIS — R011 Cardiac murmur, unspecified: Secondary | ICD-10-CM | POA: Diagnosis not present

## 2014-06-22 DIAGNOSIS — K219 Gastro-esophageal reflux disease without esophagitis: Secondary | ICD-10-CM | POA: Diagnosis not present

## 2014-06-22 DIAGNOSIS — I10 Essential (primary) hypertension: Secondary | ICD-10-CM | POA: Diagnosis not present

## 2014-06-22 DIAGNOSIS — E039 Hypothyroidism, unspecified: Secondary | ICD-10-CM | POA: Diagnosis not present

## 2014-06-22 DIAGNOSIS — G309 Alzheimer's disease, unspecified: Secondary | ICD-10-CM | POA: Diagnosis not present

## 2014-06-22 DIAGNOSIS — H4052X4 Glaucoma secondary to other eye disorders, left eye, indeterminate stage: Secondary | ICD-10-CM | POA: Diagnosis not present

## 2014-06-22 DIAGNOSIS — Z79899 Other long term (current) drug therapy: Secondary | ICD-10-CM | POA: Diagnosis not present

## 2014-06-22 DIAGNOSIS — H409 Unspecified glaucoma: Secondary | ICD-10-CM | POA: Diagnosis not present

## 2014-06-22 DIAGNOSIS — D649 Anemia, unspecified: Secondary | ICD-10-CM | POA: Diagnosis not present

## 2014-06-22 DIAGNOSIS — F0280 Dementia in other diseases classified elsewhere without behavioral disturbance: Secondary | ICD-10-CM | POA: Diagnosis not present

## 2014-06-22 DIAGNOSIS — J439 Emphysema, unspecified: Secondary | ICD-10-CM | POA: Diagnosis not present

## 2014-06-22 DIAGNOSIS — Z91040 Latex allergy status: Secondary | ICD-10-CM | POA: Diagnosis not present

## 2014-06-22 DIAGNOSIS — M810 Age-related osteoporosis without current pathological fracture: Secondary | ICD-10-CM | POA: Diagnosis not present

## 2014-06-22 DIAGNOSIS — Z882 Allergy status to sulfonamides status: Secondary | ICD-10-CM | POA: Diagnosis not present

## 2014-06-22 DIAGNOSIS — H4089 Other specified glaucoma: Secondary | ICD-10-CM | POA: Diagnosis not present

## 2014-06-22 DIAGNOSIS — M1990 Unspecified osteoarthritis, unspecified site: Secondary | ICD-10-CM | POA: Diagnosis not present

## 2014-06-22 DIAGNOSIS — I252 Old myocardial infarction: Secondary | ICD-10-CM | POA: Diagnosis not present

## 2014-06-23 NOTE — Consult Note (Signed)
PATIENT NAME:  Tammy Henry, Tammy Henry MR#:  [id number] DATE OF BIRTH:  1929-06-30  DATE OF CONSULTATION:  11/17/2011  CONSULTING PHYSICIAN:  Jill Side, MD  REASON FOR CONSULTATION: Severe anemia.   HISTORY OF PRESENT ILLNESS: The patient is a 81 year old Caucasian female with history of hypertension, hypothyroidism. She was admitted to the Emergency Room where she presented with cough productive of sputum as well as shortness of breath. The patient has been seen by Dr. Raul Del and is currently being treated for bilateral pneumonia. The patient was also noted to be severely anemic with a hemoglobin of around 7.5 and significant microcytosis and iron deficiency. The patient has never had an EGD or a colonoscopy. According to the daughter, the patient was diagnosed with leukopenia several years ago and has seen Dr. Inez Pilgrim, but no significant abnormalities were found. The patient denies any melena, hematemesis. She has seen a small amount of bright red blood per rectum with bowel movements which appears to be hemorrhoidal. She denies any other significant GI symptoms.   PAST MEDICAL HISTORY:  1. Hypertension.  2. Hypothyroidism.  3. Fibrocystic breast disease. 4. Osteoporosis.  5. Dermatitis.   SOCIAL HISTORY: She does not smoke or drink.   FAMILY HISTORY: Unremarkable.   REVIEW OF SYSTEMS: Review of systems is grossly negative except for what is mentioned in the History of Present Illness.   HOME MEDICATIONS: Acetaminophen,  aspirin 81 mg a day, levothyroxine, Robitussin and hydroxyzine.   PHYSICAL EXAMINATION:  GENERAL: Very cachectic, frail-appearing female who does not appear to be in any acute distress, quite awake and alert. She appears pale.  VITAL SIGNS: Temperature 98.5, pulse 72, respirations 20, blood pressure 144/70.   HEENT: Examination is unremarkable except for pale conjunctivae.   LUNGS: Grossly clear although there are areas of bilateral crackles.   CARDIOVASCULAR: Regular  rate and rhythm. A 1/6 systolic murmur.   ABDOMEN: Slightly distended but soft, nontender. No rebound or guarding.   NEUROLOGIC: Examination appears to be fairly nonfocal.   LABORATORY, DIAGNOSTIC AND RADIOLOGICAL DATA: Hemoglobin on admission was 7.9 and is 7.3 today after hydration.  MCV is low at 67. White cell count is normal at 6.3, and platelet count is normal at 435. Electrolytes are fine. Iron saturation is low at 6%. Alkaline phosphatase is slightly elevated at 144. The rest of the liver enzymes are fairly unremarkable.   ASSESSMENT AND PLAN: The patient is with microcytic iron deficiency anemia. She has never had a colonoscopy, raising concerns about possible colonic polyps. Other possibilities include upper GI lesions as well as conditions causing poor absorption such as celiac disease, etc. The patient is currently being treated for bilateral pneumonia. There are no signs of active gastrointestinal blood loss. I would recommend following hemoglobin and hematocrit and transfusing if needed. Once the patient's acute pulmonary problem is over, I will proceed preferably as outpatient with EGD as well as a colonoscopy for further evaluation. This has been discussed with the patient and her family, and they are in full agreement. We will follow while she is in the hospital and make appropriate arrangements for her to have the GI work-up done as an outpatient  ____________________________ Jill Side, MD si:cbb D: 11/17/2011 17:22:11 ET T: 11/17/2011 18:24:03 ET JOB#: [id number]  cc: Jill Side, MD, <Dictator> Jill Side MD ELECTRONICALLY SIGNED 11/27/2011 12:35

## 2014-06-23 NOTE — Consult Note (Signed)
Chief Complaint:   Subjective/Chief Complaint No new complaints. Hemoglobin slightly lower. Agree with transfusion. Further GI workup after resolution of pneumonia.   Electronic Signatures: Jill Side (MD)  (Signed 14-Sep-13 14:33)  Authored: Chief Complaint   Last Updated: 14-Sep-13 14:33 by Jill Side (MD)

## 2014-06-23 NOTE — Consult Note (Signed)
Brief Consult Note: Diagnosis: Microcytic, iron deficiency anemia.   Patient was seen by consultant.   Consult note dictated.   Comments: Microcytic, iron deficiency anemia without signs of active GI blood loss.  Recommendations: Follow H and H and transfuse if hemoglobin drops any further. Patient will need colonoscopy and EGD but it will be done after improvement in her pneumonia and respiratory symptoms. Will follow. Thanks.  Electronic Signatures: Jill Side (MD)  (Signed 13-Sep-13 16:24)  Authored: Brief Consult Note   Last Updated: 13-Sep-13 16:24 by Jill Side (MD)

## 2014-06-23 NOTE — Discharge Summary (Signed)
PATIENT NAME:  Tammy Henry, Tammy Henry MR#:  [id number] DATE OF BIRTH:  1929/06/03  DATE OF ADMISSION:  11/16/2011 DATE OF DISCHARGE:  11/21/2011  ADMITTING DIAGNOSIS: Cough, shortness of breath.   DISCHARGE DIAGNOSES:  1. Cough, shortness of breath felt to be multifactorial, possibly due to acute right-sided heart failure as well as possible pneumonia. The patient's symptoms improved on IV antibiotics and IV Lasix.  2. Acute respiratory failure, now resolved.  3. Pleural effusions possibly due to right-sided heart failure with elevated pulmonary pressures on the right. The patient's symptoms improved with IV Lasix.  4. Anemia, likely multifactorial, status post evaluation by GI. Will need outpatient followup. Also received transfusion.  5. Cachexia. The patient was started on Megace, seems to be responding to that.  6. Hypothyroidism.  7. Hypertension.  8. Anxiety/depression.  9. History of fibrocystic breast disease.  10. Osteoporosis.  11. Dementia.  12. Calcified pleural plaques, possibly due to asbestos exposure.   CONSULTANTS:  1. Dr. Dionne Milo 2. Dr. Raul Del  LABORATORY DATA:  Basic metabolic panel: Glucose 79 on admission, BUN 17, creatinine 0.81, sodium 139, potassium 3.5, chloride 106, CO2 23, calcium 8.3. LFTs: Total protein of 6.4, albumin of 2.6, bilirubin total 0.5, alkaline phosphatase 144, AST 14, ALT 9. Troponin less than 0.02. WBC 8.3, hemoglobin 7.9, platelet count 511. Subsequently her hemoglobin did drop to 7 on 09/14. Most recent hemoglobin on 09/17 was 9.8. Blood cultures times two: No growth. Urinalysis was nitrite negative, leukocytes negative. ACE converting enzyme was normal. Chlamydia psittaci IgG was less than 1 to 16,  antibodies were undetectable. ANA was negative. ABG on presentation: pH of 7.47, pCO2 34, pO2 49. Echocardiogram showed mild mitral regurgitation, mild tricuspid regurgitation, right ventricular systolic pressure was elevated at 50 to 60 mmHg. Left atrium was  mildly dilated. Left ventricular systolic function was normal, ejection fraction was greater than 55%.   CT scan of the chest without contrast showed nonspecific bilateral pulmonary opacities composed mainly of ground-glass densities and intralobular septal thickening. This Mcgourty represent pulmonary edema, infectious or inflammatory process, moderate size pleural effusions, consolidation volume loss in the lower lobes is likely secondary to atelectasis. Underlying parenchymal abnormality is not excluded. Calcified pleural plaques can be seen with asbestosis exposure.   HOSPITAL COURSE: Please refer to History and Physical done by the admitting physician. The patient is a 81 year old white female with history of hypertension and hypothyroidism who presented to the ED with complaint of cough, sputum, and shortness of breath for three days. The patient in the ED was noted to have sats 87%. She was thought to have a possible pneumonia. She was also thought to have possible dehydration. She was admitted, given IV fluids, placed on IV antibiotics. By day two her respiratory status did worsen. The patient was seen by pulmonary as well. She had a CT scan of the chest, which showed possible congestive heart failure or atypical infection. She was treated with antibiotics. She also had an echocardiogram which showed significantly elevated right ventricular pressures. She was started on diuresis for possible right-sided heart failure. With treatment of the right-sided heart failure her shortness of breath improved significantly. The patient was able to be weaned off to room air. She is doing much better. She is continued on treatment for possible pneumonia. The patient also was noted to have significant anemia on presentation, which was felt to likely be due to anemia of chronic disease. Her stool occult was negative. Her iron levels were low.  Her  ferritin was normal, however. She was seen in consultation by GI. They  recommended outpatient EGD and a colonoscopy. The patient did receive 1 unit of transfusion in light of the congestive heart failure and worsening respiratory symptoms. With the transfusion her shortness of breath did improve. The patient is doing much better and is stable for discharge. She is instructed to be weighed every day at the same time, preferably first thing in the morning. Call physician if gain of more than 2 pounds in one day or 5 pounds in one week. For increased weight gain, tiredness, swelling of the legs, shortness of breath, urinating less frequently, feeling faint, to call 911.   DISCHARGE MEDICATIONS:  1. Aspirin 81 mg, 1 tab p.o. daily.  2. Levothyroxine 75 mcg daily.  3. Tylenol 500 mg, 1 to 2 tabs every six hours p.r.n.  4. Robitussin 5 mL q. 4 p.r.n.  5. Hydroxyzine 25, 1 tab p.o. q.12 p.r.n. itching. 6. Boniva 150 mg once a month.  7. Lexapro 10 mg, 1 tab p.o. daily.  8. Megace 20 mL  daily.  9. Iron sulfate 325 mg 1 tab p.o. b.i.d.  10. Lasix 20 daily.  11. Klor-Con 10 milliequivalents 1 tab p.o. daily.  12. Levaquin 250 mg, 1 tab p.o. every 24 hours.  HOME HEALTH:  Yes for nurse.  DIET: Low sodium.   ACTIVITY: As tolerated.   FOLLOWUP:  1. Follow up with Dr. Rutherford Nail in 1 to 2 weeks.  2. Follow up with Dr. Raul Del in 2 to 4 weeks.  3. Follow up with Dr. Dionne Milo in 4 to 6 weeks.    TIME SPENT: 35 minutes.   ____________________________ Lafonda Mosses Posey Pronto, MD shp:bjt D: 11/22/2011 14:00:50 ET T: 11/22/2011 14:47:52 ET JOB#: [id number]  cc: Kemani Heidel H. Posey Pronto, MD, <Dictator> Ashok Norris, MD [place] MD ELECTRONICALLY SIGNED 11/23/2011 21:50

## 2014-06-23 NOTE — Consult Note (Signed)
Chief Complaint:   Subjective/Chief Complaint No new complaints. Still with some SOB. H and H better after transfusion.   VITAL SIGNS/ANCILLARY NOTES: **Vital Signs.:   16-Sep-13 08:58   Vital Signs Type Pre Medication   Temperature Temperature (F) 97.9   Celsius 36.6   Temperature Source oral   Pulse Pulse 64   Respirations Respirations 18   Systolic BP Systolic BP 333   Diastolic BP (mmHg) Diastolic BP (mmHg) 57   Mean BP 89   Pulse Ox % Pulse Ox % 100   Pulse Ox Activity Level  At rest   Oxygen Delivery 1L   Lab Results: Routine Chem:  16-Sep-13 05:52    Glucose, Serum 72   BUN 18   Creatinine (comp) 0.93   Sodium, Serum 139   Potassium, Serum  3.4   Chloride, Serum 100   CO2, Serum  33   Calcium (Total), Serum 8.5   Anion Gap  6   Osmolality (calc) 278   eGFR (African American) >60   eGFR (Non-African American)  58 (eGFR values <83m/min/1.73 m2 [place] be an indication of chronic kidney disease (CKD). Calculated eGFR is useful in patients with stable renal function. The eGFR calculation will not be reliable in acutely ill patients when serum creatinine is changing rapidly. It is not useful in  patients on dialysis. The eGFR calculation Mantia not be applicable to patients at the low and high extremes of body sizes, pregnant women, and vegetarians.)  Routine Hem:  16-Sep-13 05:52    WBC (CBC) 4.6   RBC (CBC) 4.14   Hemoglobin (CBC)  9.3   Hematocrit (CBC)  28.6   Platelet Count (CBC)  514   MCV  69   MCH  22.5   MCHC 32.5   RDW  20.0   Neutrophil % 69.8   Lymphocyte % 11.7   Monocyte % 8.0   Eosinophil % 8.5   Basophil % 2.0   Neutrophil # 3.2   Lymphocyte #  0.5   Monocyte # 0.4   Eosinophil # 0.4   Basophil # 0.1 (Result(s) reported on 20 Nov 2011 at 07:04AM.)   Assessment/Plan:  Assessment/Plan:   Assessment Microcytic anemia without signs of active GI bleeding. H and H better after PRBC transfusion. Pneumonia.    Plan OP folow up next week for  further workup of suspected chronic GI blood loss after resolution of pneumonia. Will sign off. Appointment will be made for her to see me next week. Benadryl PRN for itching. Please call me if needed. Thanks.   Electronic Signatures: IJill Side(MD)  (Signed 16-Sep-13 12:00)  Authored: Chief Complaint, VITAL SIGNS/ANCILLARY NOTES, Lab Results, Assessment/Plan   Last Updated: 16-Sep-13 12:00 by IJill Side(MD)

## 2014-06-23 NOTE — H&P (Signed)
PATIENT NAME:  Tammy Henry, Tammy Henry MR#:  [id number] DATE OF BIRTH:  May 10, 1929  DATE OF ADMISSION:  11/16/2011  PRIMARY CARE PHYSICIAN:  Dr. Ashok Norris  REFERRING PHYSICIAN: Dr. Michel Santee    CHIEF COMPLAINT: Cough, sputum, and shortness of breath for three days.   HISTORY OF PRESENT ILLNESS: 81 year old Caucasian female with a history of hypertension and hypothyroidism who presented to the Emergency Department with cough, sputum, and shortness of breath for three days. The patient is alert, awake, oriented and in no acute distress. According to the patient and her daughter, the patient has had cough with sputum and shortness of breath for the past three days, worsening today. The patient also complains of weakness but denies any headache or dizziness. No chest pain, palpitations, orthopnea, or nocturnal dyspnea. The patient's oxygen saturation was 87% in the Emergency Department. Chest x-ray showed pneumonia. The patient was treated with oxygen and Levaquin in the ED and admitted for pneumonia.   PAST MEDICAL HISTORY:   1. Hypertension.  2. Hypothyroidism.  3. Fibrocystic breast disease. 4. Osteoporosis.  5. Dermatitis.   SOCIAL HISTORY: No smoking, alcohol drinking, or illicit drugs.   PAST SURGICAL HISTORY: None.   FAMILY HISTORY: Alzheimer's dementia in both parents. Sister has Alzheimer's dementia. Sister with breast cancer.     REVIEW OF SYSTEMS: CONSTITUTIONAL: The patient denies any fever or chills. No headache or dizziness.  Has generalized weakness. EYES:  No double vision or blurred vision.  ENT: No dysphagia, slurred speech, postnasal drip, or epistaxis. CARDIOVASCULAR: No chest pain, palpitations, orthopnea, or nocturnal dyspnea. No leg edema. PULMONARY: Positive for cough, sputum, and shortness of breath, but no wheezing or hemoptysis.  GASTROINTESTINAL: No abdominal pain, nausea, vomiting, or diarrhea. No melena or bloody stool. GENITOURINARY: No dysuria, hematuria, or incontinence.  SKIN: The patient has a chronic rash but so no diagnosis. The patient had a biopsy by a dermatologist. No jaundice or bruises. NEUROLOGIC: No syncope, loss of consciousness, or seizure.   ALLERGIES: Sulfa drugs.   MEDICATIONS:  1. Acetaminophen 500 mg, 1 to 2 tablets every six hours p.r.n.  2. Aspirin 81 mg p.o. daily.  3. Levothyroxine 75 mcg p.o. daily.  4. Robitussin DM to go? 20 mg/200 mg per 10 mL oral liquid, 5 mL every four hours p.r.n. for cough.  5. Hydroxyzine hydrochloride 25 mg p.o. every 12 hours p.r.n. for itching.   PHYSICAL EXAMINATION:  VITAL SIGNS: Temperature 100, blood pressure 166/67, pulse 85, respirations 20, oxygen saturation 95% on room air.   GENERAL: The patient is alert, awake, and oriented, in no acute distress. Very thin, in malnutrition status.   HEENT: Pupils are round, equal, reactive to light, accommodation. Very dry oral mucosa. Clear oropharynx.   NECK: Supple. No JVD or carotid bruit. No lymphadenopathy. No thyromegaly.   CARDIOVASCULAR: S1, S2 regular rate and rhythm. No murmurs or gallops.   PULMONARY: Bilateral air entry. No wheezing or rales, but has mild crackles and breath sounds are weak. No use of accessory muscles to breathe.   ABDOMEN: Soft. No distention or tenderness. No organomegaly. Bowel sounds present.   EXTREMITIES: No edema, clubbing, or cyanosis. No calf tenderness. Strong bilateral pedal pulses. Some rheumatoid arthritic deformities bilateral hands.    SKIN: There is  rash all over the skin.  No jaundice or bruises.   NEUROLOGY: Alert and oriented times three. No focal deficit. Power 4/5. Sensation intact. Deep tendon reflexes mute.   LABORATORY DATA: ABG showed pH 7.47, pCO2 34, pO2  49. Glucose 79, BUN 17, creatinine 0.81. Electrolytes normal. WBC 8.3, hemoglobin 7.9, platelets 511. Troponin 0.02. Urinalysis negative. EKG shows sinus rhythm with premature atrial contractions at 83 beats per minute. Chest x-ray shows bilateral  infiltrates and some chronic changes. Please follow radiology report.   IMPRESSION:  1. Acute respiratory failure, hypoxia.  2. Pneumonia.  3. Dehydration.  4. Anemia.  5. Malnutrition. 6. Hypertension, uncontrolled.  7. Hypothyroidism.   PLAN OF TREATMENT:  1. The patient will be admitted to the medical floor. We will continue Levaquin, follow up CBC, sputum culture, and blood culture,  2. We will continue oxygen by nasal cannula and give nebulizer p.r.n. Continue Robitussin.  We will get a pulmonary consult to rule out pulmonary fibrosis.  3. For anemia, we will get stool occult and anemia work-up. In addition, we will get a GI consult. The patient's hemoglobin last year was 9.2 and is now decreased to 7.9.  4. For malnutrition we will get a dietitian consult.  5. GI and deep vein thrombosis prophylaxis.   I discussed the patient's situation and plan of treatment with the patient and the patient's daughter.          TIME SPENT: About 65 minutes.    ____________________________ Demetrios Loll, MD qc:bjt D: 11/16/2011 20:52:25 ET T: 11/17/2011 07:29:11 ET JOB#: [id number]  cc: Demetrios Loll, MD, <Dictator> Ashok Norris, MD Demetrios Loll MD ELECTRONICALLY SIGNED 11/17/2011 18:50

## 2014-06-26 DIAGNOSIS — I10 Essential (primary) hypertension: Secondary | ICD-10-CM | POA: Diagnosis not present

## 2014-06-26 DIAGNOSIS — E039 Hypothyroidism, unspecified: Secondary | ICD-10-CM | POA: Diagnosis not present

## 2014-06-26 DIAGNOSIS — D500 Iron deficiency anemia secondary to blood loss (chronic): Secondary | ICD-10-CM | POA: Diagnosis not present

## 2014-06-26 DIAGNOSIS — R0602 Shortness of breath: Secondary | ICD-10-CM | POA: Diagnosis not present

## 2014-06-27 NOTE — Consult Note (Signed)
Chief Complaint:  Subjective/Chief Complaint Still SOB. CXR suggests pulmonary edema. On higher O2. Daughter states patient not interested in getting colonoscopy/EGD now. No CBC today.   VITAL SIGNS/ANCILLARY NOTES: **Vital Signs.:   17-Oct-15 07:46  Vital Signs Type Q 8hr  Temperature Temperature (F) 98.3  Celsius 36.8  Temperature Source oral  Pulse Pulse 94  Respirations Respirations 18  Systolic BP Systolic BP 662  Diastolic BP (mmHg) Diastolic BP (mmHg) 66  Mean BP 101  Pulse Ox % Pulse Ox % 90  Pulse Ox Activity Level  At rest  Oxygen Delivery 3.5L   Brief Assessment:  GEN no acute distress   Cardiac Regular   Respiratory rhonchi   Gastrointestinal Normal   Radiology Results: XRay:    16-Oct-15 17:36, Chest Portable Single View  Chest Portable Single View   REASON FOR EXAM:    pneumonia/chf  COMMENTS:       PROCEDURE: DXR - DXR PORTABLE CHEST SINGLE VIEW  - Dec 19 2013  5:36PM     CLINICAL DATA:  81 year old female with pneumonia, congestive heart  pre and anemia. Initial encounter.    EXAM:  PORTABLE CHEST - 1 VIEW    COMPARISON:  None.    FINDINGS:  Biapical pleural thickening with calcification.  Diffuse airspace disease with bilateral pleural effusions greater on  the right. Appearance is suggestive of pulmonary edema. Underlying  infectious infiltrate not excluded in the proper clinical setting.  This limits evaluation for detection of underlying mass. Recommend  followup until clearance.    Cardiomegaly.    Calcified aorta.     IMPRESSION:  Diffuse airspace disease with bilateral pleural effusions greater on  the right. Appearance is suggestive of pulmonary edema. Underlying  infectious infiltrate not excluded in the proper clinical setting.  This limits evaluation for detection of underlying mass. Recommend  followup until clearance.    Cardiomegaly.      Electronically Signed    By: Chauncey Cruel M.D.    On: 12/19/2013 17:42          Verified By: Doug Sou, M.D.,   Assessment/Plan:  Assessment/Plan:  Assessment Anemia. Heme positive stool. SOB. Pt interested in endoscopies right now.   Plan Recheck CBC. Continue to moniter breathing. Since patient refusing procedures, will gradually advance diet. thanks   Electronic Signatures: Verdie Shire (MD)  (Signed 17-Oct-15 11:09)  Authored: Chief Complaint, VITAL SIGNS/ANCILLARY NOTES, Brief Assessment, Radiology Results, Assessment/Plan   Last Updated: 17-Oct-15 11:09 by Verdie Shire (MD)

## 2014-06-27 NOTE — H&P (Signed)
PATIENT NAME:  Tammy Henry, Tammy Henry MR#:  [id number] DATE OF BIRTH:  24-Feb-1930  DATE OF ADMISSION:  12/17/2013  ADMITTING PHYSICIAN:  Gladstone Lighter, MD.    PRIMARY CARE PHYSICIAN:  Steele Sizer, MD.    CHIEF COMPLAINT: Sent in for low hemoglobin.   HISTORY OF PRESENT ILLNESS: Tammy Henry is an 81 year old elderly Caucasian female with past medical history significant for history of CVA, mild dementia, and hypothyroidism, sent in from PCP's office for low hemoglobin of 5.6 based on laboratories from yesterday. The patient went to see her physician yesterday as a part of routine visit and has complained of dyspnea on exertion and also weakness for 1-2 days. She appeared very pale, so her PCP ordered laboratories and this morning called that her hemoglobin is 5.6 and asked her to come in.  Most of the history is obtained from patient's daughter who is at bedside. According to her the patient was never labeled to be anemic other than several years ago when she had a stroke, she received 1 unit of transfusion at that time. Her symptoms have started only 2 days ago that she has been getting easily out of breath and also fatigued. She at baseline walks with assistance. No recent requirements for any transfusion. They do not know her baseline hemoglobin at this time. Daughter said that no obvious bleeding was noted. The patient has not complained of nausea, vomiting, or hematemesis. A couple of episodes when patient had some blood in the toilet when she used the bathroom, however they thought it was from hemorrhoidal bleeding and it did not repeat itself.    PAST MEDICAL HISTORY:  1.  History of CVA with no residual neurological deficits.  2.  Mild dementia.  3.  Lung nodule.   4.  Hypothyroidism.    PAST SURGICAL HISTORY:   Cataract surgery.   ALLERGIES TO MEDICATIONS:  SULFA.    CURRENT HOME MEDICATIONS: Synthroid 75 micrograms p.o. daily.   SOCIAL HISTORY: Lives at home with daughter. Needs assistance  for walking. No smoking or alcohol abuse.   FAMILY HISTORY: Significant for Alzheimer dementia in the family.   REVIEW OF SYSTEMS:  CONSTITUTIONAL: No fever.  Positive for fatigue and weakness.  EYES: No blurred vision, double vision, inflammation, or glaucoma.  ENT: No tinnitus, ear pain, hearing loss, epistaxis, or discharge.  RESPIRATORY: No cough, wheeze, hemoptysis, or COPD.   CARDIOVASCULAR: No chest pain, orthopnea, edema, arrhythmia. Positive for dyspnea on exertion. No palpitations or syncope.  GASTROINTESTINAL: No nausea, vomiting, diarrhea, hematemesis. Positive for a couple of episodes of bloody stools.   GENITOURINARY:  No dysuria, hematuria, renal calculus, frequency, or incontinence.  ENDOCRINE: No polyuria, nocturia, thyroid problems, heat or cold intolerance.  HEMATOLOGY: Positive for anemia.  No easy bruising or bleeding.  SKIN: No acne, rash, or lesions.  MUSCULOSKELETAL: No neck, back, or rib pain, arthritis, or gout.  NEUROLOGIC: History of CVA. No numbness, weakness, seizures present.  PSYCHOLOGICAL: No anxiety, insomnia, or depression.   PHYSICAL EXAMINATION:  VITAL SIGNS: Temperature 98.4 degrees Fahrenheit, pulse 74, respirations 17, blood pressure 158/58, pulse oximetry 98% on room air.  GENERAL: Pale-appearing, well-built, well-nourished female sitting in bed, not in any acute distress.  HEENT: Normocephalic, atraumatic. Very pale-appearing face.  Anicteric sclerae. Extraocular movements intact. Oropharynx is clear without erythema, mass, or exudates.  NECK: Supple, nontender. No thyromegaly, JVD, or carotid bruits. No lymphadenopathy.  LUNGS: Moving air bilaterally. No wheeze or crackles. No use of accessory muscles for breathing.  CARDIOVASCULAR: S1, S2, regular rate and rhythm. A 2/6 systolic murmur present. No rubs or gallops.  ABDOMEN: Soft, nontender, nondistended. No hepatosplenomegaly. Normal bowel sounds.  EXTREMITIES: No pedal edema. No clubbing or  cyanosis.  2 + dorsalis pedis pulses palpable bilaterally.  SKIN: No acne, rash, or lesions.  LYMPHATIC:  No cervical or inguinal lymphadenopathy. NEUROLOGIC: Cranial nerves intact. No focal motor or sensory deficit.   PSYCHIATRIC:  The patient is awake, alert, oriented x 2.   LABORATORY DATA:  1.  WBC 7.0, hemoglobin 5.3, hematocrit 19.4, platelet count 459,000, MCV is 68.  2.  Sodium 142, potassium 3.6, chloride 109, bicarbonate 26, BUN 18, creatinine 1.1, glucose 90, and calcium of 7.9.  3.  ALT 18, AST 23, alkaline phosphatase 292, total bilirubin 0.3, and albumin of 3.0.  4.  Troponin is negative.  5.  EKG showing normal sinus rhythm, heart rate of 74, no acute ST-T wave abnormalities noted.   ASSESSMENT AND PLAN: An 81 year old female with history of dementia, history of cerebrovascular accident, hypothyroidism, comes in from primary care 84 office for low hemoglobin of 5.5.   1.  Anemia likely acute on chronic, few episodes of bloody stools per daughter, no previous colonoscopy or esophagogastroduodenoscopy done. Check iron studies, get GI consult and also occult blood has been ordered. 1 unit of packed RBCs ordered already in the ER for symptomatic anemia and check hemoglobin q. 8 hours.  Protonix b.i.d. for now.  2.  Hypothyroidism, on Synthroid.  3.  History of cerebrovascular accident, no residual neurological deficits, takes aspirin only occasionally, anemic now, will hold with anemia.   4.  Deep vein thrombosis prophylaxis. TEDs and SCDs.   CODE STATUS: Full code.   TIME SPENT ON ADMISSION: 50 minutes.     ____________________________ Gladstone Lighter, MD rk:bu D: 12/17/2013 17:18:33 ET T: 12/17/2013 18:15:25 ET JOB#: [id number]  cc: Gladstone Lighter, MD, <Dictator> Bethena Roys. Ancil Boozer, MD Gladstone Lighter MD ELECTRONICALLY SIGNED 12/18/2013 17:42

## 2014-06-27 NOTE — Consult Note (Signed)
Pt seen and examined. Full consult to follow. Pt presented with significant anemia. Poor historian due to dementia. Denies GI symptoms, but daughter stated patient having intermittent rectal bleeding. Some SOB on presentation. Hgb down to 4.9 before 1 unit of PRBC overnight. This AM, hgb still low at 6.9. Still looks quite pale. No prior EGD or colonoscopy. Discussed EGD and colonoscopy with daughter. Would like patient to receive 1 more unit of PRBC today before we prep her tonight for EGD and colonoscopy tomorrow. Thanks.   Electronic Signatures: Verdie Shire (MD) (Signed on 15-Oct-15 08:15)  Authored   Last Updated: 15-Oct-15 08:15 by Verdie Shire (MD)

## 2014-06-27 NOTE — Discharge Summary (Signed)
PATIENT NAME:  Tammy Henry, Tammy Henry MR#:  [id number] DATE OF BIRTH:  11/04/1929  DATE OF ADMISSION:  12/17/2013 DATE OF DISCHARGE:  12/22/2013  DISCHARGE DIAGNOSES: 1.  Acute on chronic anemia secondary to slow gastrointestinal losses.  2.  History of rectal bleed at home. 3.  Iron deficiency anemia.  4.  Hypothyroidism.  5.  Dementia with some agitation while she was in the hospital. 6.  Pulmonary edema secondary to fluid overload.   DISCHARGE MEDICATIONS: 1.  Levothyroxine 75 mcg p.o. daily.  2.  Amlodipine 10 mg p.o. daily.  3.  Furosemide 40 mg daily for 4 days.  4.  Pantoprazole 40 mg p.o. b.i.d.  5.  Ferrous sulfate 325 mg p.o. t.i.d.  DISPOSITION: Discharged home with home physical therapy.  CONSULTATION: Gastroenterology with Dr. Candace Cruise, physical therapy.  HOSPITAL COURSE: 1.  Anemia. The patient is an 81 year old female with history of CVA, mild dementia sent in from Dr. Steele Sizer' office secondary to low hemoglobin. Hemoglobin was 5.6 at the physician's office. The patient admitted for acute on chronic anemia. On the day of admission, she was very pale and the patient had symptoms of tiredness, weakness at home 2 days before. The did not have any nausea or vomiting, but family noticed some blood in the stool when she was using the bathroom. The patient admitted to medical service. On admission day, hemoglobin was 5.3 and crit was 19.4. The patient was admitted to medical service and started on Protonix twice a day. The patient received a total of 2 units of transfusion. The patient's hemoglobin improved to 8.1 then 7.7 on October 18th. Did not have any further blood in the stools. No hematemesis. Tolerated the diet and she is on full liquid diet. The patient had an anemia work-up done, which includes iron and ferritin levels. The patient's iron levels were 8 and then iron binding capacity 393. The patient's ferritin levels were 6. The patient's hemoglobin did drop to 4.9 on second blood work  on October 14th. The patient was seen by Dr. Candace Cruise. She is supposed to have an EGD and colonoscopy, and the patient was kept n.p.o., but the patient could not finish the [facility] and so the procedure could not be done. The patient thought to have repeat colonoscopy and EGD today, but because her hemoglobin was normal she was not able to tolerate the [employer], the procedure was not done today, and the family chose not to have the EGD or colonoscopy and the patient also did not want it. The patient needs to follow up with the gastroenterologist in 1 to 2 weeks and also follow up with Dr. Steele Sizer in a week to see about followup of hemoglobin and see if she needs further transfusion. So for now the patient is on iron sulfate tablets for iron deficiency anemia.  2.  Pulmonary edema. The patient's received blood transfusions and fluids when she was here, had some trouble breathing and also hypoxia requiring oxygen. The patient's O2 sats dropped to 88% on 3.5 liters and it happened on October 17th. The patient's chest x-ray was done which showed bilateral pleural effusions and also pulmonary edema. The patient was given Lasix, a few doses, and today she feels much better, no more rales on physical examination and oxygenation is 93% on 1 liter.  3.  The patient does have hypothyroidism. She is on Synthroid.  4.  Hypertension. The patient's blood pressure is stable. She is on Norvasc and BP is 140/60. On discharge,  temperature is 98.1, heart rate 77, blood pressure 146/62, sats 93% on 1 liter. 5.  Dementia with behavioral disturbances. The patient was agitated requiring Haldol in this hospitalization. The patient feels much better now and is back to baseline. Her daughter is there at bedside. The patient was seen by physical therapy. Physical therapy recommended short-term rehab versus SNF, but the family decided to take her home so we are discharging home with home physical therapy.   TIME SPENT: More than 30  minutes.  ____________________________ Epifanio Lesches, MD sk:sb D: 12/22/2013 09:40:18 ET T: 12/22/2013 10:00:08 ET JOB#: [id number]  cc: Epifanio Lesches, MD, <Dictator> Bethena Roys. Ancil Boozer, MD Lupita Dawn. Candace Cruise, MD  Epifanio Lesches MD ELECTRONICALLY SIGNED 12/27/2013 18:57

## 2014-06-27 NOTE — Consult Note (Signed)
Chief Complaint:  Subjective/Chief Complaint EGD and colonoscopy had to be cancelled. Pt only drank part of the bowel prep and had only one BM early this AM. No complaints but appears dyspneic.   VITAL SIGNS/ANCILLARY NOTES: **Vital Signs.:   16-Oct-15 07:58  Vital Signs Type Q 8hr  Temperature Temperature (F) 98.7  Celsius 37  Temperature Source oral  Pulse Pulse 91  Respirations Respirations 18  Systolic BP Systolic BP 124  Diastolic BP (mmHg) Diastolic BP (mmHg) 83  Mean BP 118  Pulse Ox % Pulse Ox % 96  Pulse Ox Activity Level  At rest  Oxygen Delivery 3L   Brief Assessment:  GEN no acute distress   Cardiac Regular   Respiratory clear BS   Gastrointestinal Normal   Lab Results: Routine Hem:  16-Oct-15 04:15   Hemoglobin (CBC)  8.1 (Result(s) reported on 19 Dec 2013 at 05:51AM.)   Assessment/Plan:  Assessment/Plan:  Assessment Anemia. S/P 2 units of blood transfusions so far. Tachypneic today.   Plan I don't know whether patient will be able or willing to drink the whole prep to proceed with colonoscopy or not. Anyway, patient is SOB. Breathing has to stabilize 1st. For now, keep on clears. If breathing improves and patient willing to be cleaned out, then perhaps reschedule colonsocopy/EGD on Monday. Will see how she does over the weekend. Thanks   Electronic Signatures: Verdie Shire (MD)  (Signed 16-Oct-15 13:28)  Authored: Chief Complaint, VITAL SIGNS/ANCILLARY NOTES, Brief Assessment, Lab Results, Assessment/Plan   Last Updated: 16-Oct-15 13:28 by Verdie Shire (MD)

## 2014-06-27 NOTE — Consult Note (Signed)
PATIENT NAME:  Tammy Henry, Tammy Henry MR#:  [id number] DATE OF BIRTH:  [birth year]  DATE OF CONSULTATION:  12/18/2013  CONSULTING PHYSICIAN:  Lupita Dawn. Jonnelle Lawniczak, MD  REASON FOR REFERRAL: Severe anemia.   HISTORY OF PRESENT ILLNESS: The patient is an 81 year old white female with known history of dementia and stroke who was sent from the primary doctor's office with hemoglobin of only 5.6. The patient had been complaining of some mild dyspnea on exertion and feeling weak for a day or 2. She appeared very pale. When her hemoglobin came back at 5.6 she was asked to be admitted to the hospital. I was asked to see the patient because of persistent anemia.   Because of dementia she essentially denied all GI symptoms of nausea, vomiting, heartburn or indigestion. She denied having any abdominal pain or cramping or diarrhea or constipation. The daughter was present at bedside. She did notice some rectal bleeding on and off recently. It was mostly in the toilet whenever she used the bathroom. Daughter was not aware that she was getting more pale by the day.   PAST MEDICAL HISTORY: Notable for stroke, dementia, hypothyroid, some lung nodules.   PAST SURGICAL HISTORY:  Notable for cataract surgery.   ALLERGIES: SULFA.   MEDICATIONS: The only medication she was on was Synthroid daily.   SOCIAL HISTORY: She lives at home with her daughter. There is no tobacco or alcohol use.   FAMILY HISTORY: Notable for dementia.   REVIEW OF SYSTEMS: The same as was dictated by Dr. Tressia Miners on October 14.  Please refer to this for information.   According to the daughter the patient never had any prior history of ulcers or polyps or colitis. She never had any prior upper endoscopy or colonoscopy.   LABORATORY DATA:  From October 14: BUN was 18, creatinine 1.18, sodium 142, potassium 3.6, calcium was 7.9. Liver enzymes are normal. Hemoglobin was 5.5, then fell to 4.9. She was given 1 unit of blood and it went up to 6.9.   EKG: Negative.    ASSESSMENT AND PLAN: This is a patient with symptomatic anemia with hemoglobin down to 4.9 before she was transfused. She still looks quite pale. She no longer feels short of breath after she was admitted. She does need to have a colonoscopy and perhaps upper endoscopy done to figure out where she was bleeding from. I talked to the daughter about proceeding with that on Friday, which is October 16.  She will need to be prepped first before we can proceed. I will also recommend that she receive another unit of blood today prior to having endoscopic procedures.   Thank you for the referral.     ____________________________ Lupita Dawn. Candace Cruise, MD pyo:lt D: 12/19/2013 12:11:47 ET T: 12/19/2013 13:36:34 ET JOB#: [id number]  cc: Lupita Dawn. Candace Cruise, MD, <Dictator> Lupita Dawn Allin Frix MD ELECTRONICALLY SIGNED 12/21/2013 9:12

## 2014-06-27 NOTE — Consult Note (Signed)
Chief Complaint:  Subjective/Chief Complaint No active bleeding. Breathing appears better. Pt still does not want any procedures.   VITAL SIGNS/ANCILLARY NOTES: **Vital Signs.:   18-Oct-15 08:09  Vital Signs Type Q 8hr  Temperature Temperature (F) 97.2  Celsius 36.2  Temperature Source oral  Pulse Pulse 97  Respirations Respirations 18  Systolic BP Systolic BP 979  Diastolic BP (mmHg) Diastolic BP (mmHg) 63  Mean BP 89  Pulse Ox % Pulse Ox % 90  Pulse Ox Activity Level  At rest  Oxygen Delivery 3L   Brief Assessment:  GEN no acute distress   Cardiac Regular   Respiratory crackles  crackles on lung base   Lab Results: Cardiac:  18-Oct-15 04:19   Troponin I < 0.02 (0.00-0.05 0.05 ng/mL or less: NEGATIVE  Repeat testing in 3-6 hrs  if clinically indicated. >0.05 ng/mL: POTENTIAL  MYOCARDIAL INJURY. Repeat  testing in 3-6 hrs if  clinically indicated. NOTE: An increase or decrease  of 30% or more on serial  testing suggests a  clinically important change)  Routine Hem:  18-Oct-15 04:19   WBC (CBC) 9.4  RBC (CBC)  3.57  Hemoglobin (CBC)  7.7  Hematocrit (CBC)  25.2  Platelet Count (CBC) 381  MCV  71  MCH  21.4  MCHC  30.3  RDW  21.6  Neutrophil % 80.4  Lymphocyte % 6.3  Monocyte % 9.6  Eosinophil % 3.2  Basophil % 0.5  Neutrophil #  7.6  Lymphocyte #  0.6  Monocyte # 0.9  Eosinophil # 0.3  Basophil # 0.0 (Result(s) reported on 21 Dec 2013 at 04:57AM.)   Assessment/Plan:  Assessment/Plan:  Assessment Anemia. SOB. Pulmonary edema.   Plan Since patient does not want to undergo any procedures, I will sign off. Can advance diet as tolerated. Transfuse as needed as long as breathing is stable. Thanks.   Electronic Signatures: Verdie Shire (MD)  (Signed 18-Oct-15 10:51)  Authored: Chief Complaint, VITAL SIGNS/ANCILLARY NOTES, Brief Assessment, Lab Results, Assessment/Plan   Last Updated: 18-Oct-15 10:51 by Verdie Shire (MD)

## 2014-06-29 ENCOUNTER — Ambulatory Visit: Admit: 2014-06-29 | Payer: Self-pay | Admitting: Internal Medicine

## 2014-07-15 ENCOUNTER — Encounter: Payer: Self-pay | Admitting: Internal Medicine

## 2014-07-15 ENCOUNTER — Inpatient Hospital Stay: Payer: Medicare Other | Attending: Internal Medicine | Admitting: Internal Medicine

## 2014-07-15 ENCOUNTER — Other Ambulatory Visit: Payer: Self-pay | Admitting: *Deleted

## 2014-07-15 DIAGNOSIS — D649 Anemia, unspecified: Secondary | ICD-10-CM | POA: Diagnosis not present

## 2014-07-15 LAB — SAMPLE TO BLOOD BANK

## 2014-07-15 LAB — HEMOGLOBIN: Hemoglobin: 7.1 g/dL — ABNORMAL LOW (ref 12.0–16.0)

## 2014-07-15 LAB — ABO/RH: ABO/RH(D): O POS

## 2014-07-15 NOTE — Progress Notes (Unsigned)
Patient here for initial evaluation for low hgb and ferritin. Patient denies any pain. Daughter states patient has dementia.

## 2014-07-16 ENCOUNTER — Other Ambulatory Visit: Payer: Self-pay | Admitting: Internal Medicine

## 2014-07-16 DIAGNOSIS — D509 Iron deficiency anemia, unspecified: Secondary | ICD-10-CM

## 2014-07-17 DIAGNOSIS — H34812 Central retinal vein occlusion, left eye: Secondary | ICD-10-CM | POA: Diagnosis not present

## 2014-07-24 ENCOUNTER — Inpatient Hospital Stay: Payer: Medicare Other | Admitting: Internal Medicine

## 2014-07-24 ENCOUNTER — Other Ambulatory Visit: Payer: Self-pay

## 2014-07-24 ENCOUNTER — Other Ambulatory Visit: Payer: Medicare Other

## 2014-07-24 ENCOUNTER — Ambulatory Visit: Payer: Self-pay | Admitting: Internal Medicine

## 2014-07-27 DIAGNOSIS — L309 Dermatitis, unspecified: Secondary | ICD-10-CM | POA: Diagnosis not present

## 2014-07-27 DIAGNOSIS — G3109 Other frontotemporal dementia: Secondary | ICD-10-CM | POA: Diagnosis not present

## 2014-07-27 DIAGNOSIS — G8929 Other chronic pain: Secondary | ICD-10-CM | POA: Diagnosis not present

## 2014-07-27 DIAGNOSIS — D500 Iron deficiency anemia secondary to blood loss (chronic): Secondary | ICD-10-CM | POA: Diagnosis not present

## 2014-07-27 DIAGNOSIS — F0280 Dementia in other diseases classified elsewhere without behavioral disturbance: Secondary | ICD-10-CM | POA: Diagnosis not present

## 2014-07-27 DIAGNOSIS — E039 Hypothyroidism, unspecified: Secondary | ICD-10-CM | POA: Diagnosis not present

## 2014-07-27 DIAGNOSIS — M25512 Pain in left shoulder: Secondary | ICD-10-CM | POA: Diagnosis not present

## 2014-08-13 ENCOUNTER — Telehealth: Payer: Self-pay | Admitting: Family Medicine

## 2014-08-13 NOTE — Telephone Encounter (Signed)
Left vm for return call

## 2014-08-13 NOTE — Telephone Encounter (Signed)
Pt daughter called states her mom is still itching very badly and wants to see about getting stronger medication?

## 2014-08-14 ENCOUNTER — Other Ambulatory Visit: Payer: Self-pay

## 2014-08-14 DIAGNOSIS — L299 Pruritus, unspecified: Secondary | ICD-10-CM

## 2014-08-14 NOTE — Telephone Encounter (Signed)
Let's refer her to a dermatolgoist.

## 2014-08-20 ENCOUNTER — Ambulatory Visit: Payer: Medicare Other | Admitting: Primary Care

## 2014-08-24 ENCOUNTER — Other Ambulatory Visit: Payer: Medicare Other

## 2014-08-24 ENCOUNTER — Ambulatory Visit: Payer: Medicare Other | Admitting: Hematology and Oncology

## 2014-09-17 ENCOUNTER — Other Ambulatory Visit: Payer: Self-pay

## 2014-09-17 DIAGNOSIS — D649 Anemia, unspecified: Secondary | ICD-10-CM

## 2014-09-23 ENCOUNTER — Telehealth: Payer: Self-pay | Admitting: Family Medicine

## 2014-09-23 DIAGNOSIS — R21 Rash and other nonspecific skin eruption: Secondary | ICD-10-CM

## 2014-09-23 NOTE — Telephone Encounter (Signed)
Patient is very itchy and states it is getting worst on bilateral legs. States the cream and Hydroxyzine are not helping, please advise.

## 2014-09-23 NOTE — Telephone Encounter (Signed)
Daughter would like a call back about her mother.

## 2014-09-23 NOTE — Telephone Encounter (Signed)
I will send referral to Dermatologist

## 2014-09-24 ENCOUNTER — Inpatient Hospital Stay (HOSPITAL_BASED_OUTPATIENT_CLINIC_OR_DEPARTMENT_OTHER): Payer: Medicare Other | Admitting: Hematology and Oncology

## 2014-09-24 ENCOUNTER — Inpatient Hospital Stay: Payer: Medicare Other | Attending: Hematology and Oncology

## 2014-09-24 ENCOUNTER — Other Ambulatory Visit: Payer: Self-pay

## 2014-09-24 ENCOUNTER — Inpatient Hospital Stay: Payer: Medicare Other

## 2014-09-24 VITALS — BP 170/68 | HR 58 | Temp 95.6°F | Resp 19 | Ht 64.0 in | Wt 94.7 lb

## 2014-09-24 DIAGNOSIS — R634 Abnormal weight loss: Secondary | ICD-10-CM | POA: Insufficient documentation

## 2014-09-24 DIAGNOSIS — D649 Anemia, unspecified: Secondary | ICD-10-CM

## 2014-09-24 DIAGNOSIS — R5383 Other fatigue: Secondary | ICD-10-CM

## 2014-09-24 DIAGNOSIS — I10 Essential (primary) hypertension: Secondary | ICD-10-CM | POA: Diagnosis not present

## 2014-09-24 DIAGNOSIS — G3109 Other frontotemporal dementia: Secondary | ICD-10-CM | POA: Insufficient documentation

## 2014-09-24 DIAGNOSIS — Z79899 Other long term (current) drug therapy: Secondary | ICD-10-CM

## 2014-09-24 DIAGNOSIS — D509 Iron deficiency anemia, unspecified: Secondary | ICD-10-CM

## 2014-09-24 DIAGNOSIS — F0280 Dementia in other diseases classified elsewhere without behavioral disturbance: Secondary | ICD-10-CM | POA: Diagnosis not present

## 2014-09-24 DIAGNOSIS — M810 Age-related osteoporosis without current pathological fracture: Secondary | ICD-10-CM | POA: Diagnosis not present

## 2014-09-24 LAB — IRON AND TIBC
Iron: 19 ug/dL — ABNORMAL LOW (ref 28–170)
Saturation Ratios: 6 % — ABNORMAL LOW (ref 10.4–31.8)
TIBC: 322 ug/dL (ref 250–450)
UIBC: 303 ug/dL

## 2014-09-24 LAB — CBC WITH DIFFERENTIAL/PLATELET
Basophils Absolute: 0.1 10*3/uL (ref 0–0.1)
Basophils Relative: 1 %
Eosinophils Absolute: 0.8 10*3/uL — ABNORMAL HIGH (ref 0–0.7)
Eosinophils Relative: 13 %
HCT: 30.5 % — ABNORMAL LOW (ref 35.0–47.0)
Hemoglobin: 9.1 g/dL — ABNORMAL LOW (ref 12.0–16.0)
Lymphocytes Relative: 11 %
Lymphs Abs: 0.7 10*3/uL — ABNORMAL LOW (ref 1.0–3.6)
MCH: 22.2 pg — ABNORMAL LOW (ref 26.0–34.0)
MCHC: 30 g/dL — ABNORMAL LOW (ref 32.0–36.0)
MCV: 74 fL — ABNORMAL LOW (ref 80.0–100.0)
Monocytes Absolute: 0.4 10*3/uL (ref 0.2–0.9)
Monocytes Relative: 7 %
Neutro Abs: 4.2 10*3/uL (ref 1.4–6.5)
Neutrophils Relative %: 68 %
Platelets: 442 10*3/uL — ABNORMAL HIGH (ref 150–440)
RBC: 4.12 MIL/uL (ref 3.80–5.20)
RDW: 19 % — ABNORMAL HIGH (ref 11.5–14.5)
WBC: 6.1 10*3/uL (ref 3.6–11.0)

## 2014-09-24 LAB — COMPREHENSIVE METABOLIC PANEL
ALT: 11 U/L — ABNORMAL LOW (ref 14–54)
AST: 16 U/L (ref 15–41)
Albumin: 3.3 g/dL — ABNORMAL LOW (ref 3.5–5.0)
Alkaline Phosphatase: 72 U/L (ref 38–126)
Anion gap: 4 — ABNORMAL LOW (ref 5–15)
BUN: 18 mg/dL (ref 6–20)
CO2: 26 mmol/L (ref 22–32)
Calcium: 7.9 mg/dL — ABNORMAL LOW (ref 8.9–10.3)
Chloride: 106 mmol/L (ref 101–111)
Creatinine, Ser: 1 mg/dL (ref 0.44–1.00)
GFR calc Af Amer: 59 mL/min — ABNORMAL LOW (ref >60)
GFR calc non Af Amer: 51 mL/min — ABNORMAL LOW (ref >60)
Glucose, Bld: 89 mg/dL (ref 65–99)
Potassium: 4.3 mmol/L (ref 3.5–5.1)
Sodium: 136 mmol/L (ref 135–145)
Total Bilirubin: 0.4 mg/dL (ref 0.3–1.2)
Total Protein: 6.3 g/dL — ABNORMAL LOW (ref 6.5–8.1)

## 2014-09-24 LAB — SEDIMENTATION RATE: Sed Rate: 27 mm/hr (ref 0–30)

## 2014-09-24 LAB — RETICULOCYTES
RBC.: 4.1 MIL/uL (ref 3.80–5.20)
Retic Count, Absolute: 82 10*3/uL (ref 19.0–183.0)
Retic Ct Pct: 2 % (ref 0.4–3.1)

## 2014-09-24 LAB — TSH: TSH: 3.054 u[IU]/mL (ref 0.350–4.500)

## 2014-09-24 LAB — SAMPLE TO BLOOD BANK

## 2014-09-24 LAB — FERRITIN: Ferritin: 13 ng/mL (ref 11–307)

## 2014-09-24 NOTE — Telephone Encounter (Signed)
Patient notified

## 2014-09-24 NOTE — Progress Notes (Signed)
Tammy Henry day:  09/24/2014  Chief Complaint: Tammy Henry is a 81 y.o. female with anemia who is seen for reassessment.  HPI: The patient notes a history of anemia for 1 year.  Dr Etter Sjogren notes indicate she received 2 units of PRBCs in 12/2013.  She has refused colonoscopy or EGD.  She cancelled an appointment with GI.  She states that she was taking 1 iron pill a day and then for the past few months has been taking 2 pills a day.  She denies any hematochezia, hematuria, or vaginal bleeding.  She does note black stools since initiation of oral iron.  Diet consists of vegetables and grilled chicken 3 times a week.  Labs from 12/21/2013 revealed a hematocrit of 25.2, hemoglobin 7.7, MCV 71, platelets 381,000, WBC 9400 with an [place] of 7600.  Differential was unremarkable.  Hemoglobin was 5.6 on 12/17/2013.  Occult blood was positive on 12/20/2013.  Symptomatically, she has lost 13 pounds in the past 2 months.  She denies any family history of blood disorder or malignancy.  Past Medical History  Diagnosis Date  . Other frontotemporal dementia   . Acute kidney failure, unspecified   . Osteoporosis, unspecified   . Essential hypertension, benign   . Depressive disorder, not elsewhere classified   . Edema   . Other psoriasis   . Unspecified venous (peripheral) insufficiency   . Anxiety state, unspecified   . Unspecified hypothyroidism   . Functional diarrhea   . Unspecified pruritic disorder   . Other specified disorders of shoulder joint   . Cachexia   . Dehydration   . Urinary tract infection, site not specified   . Other and unspecified disc disorder of cervical region   . Early satiety   . Iron deficiency anemia, unspecified   . Nonspecific abnormal electrocardiogram (ECG) (EKG)     No past surgical history on file.  Family History  Problem Relation Age of Onset  . Lymphoma Mother   . Alzheimer's disease Mother   .  Alzheimer's disease Father   . Breast cancer Sister     Social History:  reports that she has never smoked. She has never used smokeless tobacco. She reports that she does not drink alcohol or use illicit drugs.  She lives in [place] with her daughter.  The patient is accompanied by her daughter,  Tammy Henry today.  Allergies:  Allergies  Allergen Reactions  . Lamisil [Terbinafine Hcl]   . Sulfur     Current Medications: Current Outpatient Prescriptions  Medication Sig Dispense Refill  . ammonium lactate (AMLACTIN) 12 % cream APPLY TO RASH TWICE A DAY as directed by prescriber  0  . ferrous sulfate 325 (65 FE) MG tablet Take 325 mg by mouth daily with breakfast.    . hydrOXYzine (ATARAX/VISTARIL) 25 MG tablet Take 25 mg by mouth as needed.    Marland Kitchen levothyroxine (SYNTHROID, LEVOTHROID) 75 MCG tablet Take 75 mcg by mouth daily.    Marland Kitchen triamcinolone cream (KENALOG) 0.1 % MIX WITH EUCERIN AT HOME AND APPLY TO RASH TWICE A DAY AS DIRECTED by prescriber  0   No current facility-administered medications for this visit.    Review of Systems:  GENERAL:  Fatigue.  No fevers or sweats.  Weight loss of 13 pounds in 2 months. PERFORMANCE STATUS (ECOG):  2 HEENT:  No visual changes, runny nose, sore throat, mouth sores or tenderness. Lungs: No shortness of breath or cough.  No hemoptysis. Cardiac:  No chest pain, palpitations, orthopnea, or PND. GI:  No nausea, vomiting, diarrhea, constipation, or hematochezia.  Black stools on oral iron. GU:  No urgency, frequency, dysuria, or hematuria. Musculoskeletal:  No back pain.  No joint pain.  No muscle tenderness. Extremities:  No pain or swelling. Skin:  Itchy skin.  No rashes or skin changes. Neuro:  No headache, numbness or weakness, balance or coordination issues. Endocrine:  Hypothyroid on Synthroid.  No diabetes, hot flashes or night sweats. Psych:  No mood changes, depression or anxiety. Pain:  No focal pain. Review of systems:  All other  systems reviewed and found to be negative.  Physical Exam: Blood pressure 170/68, pulse 58, temperature 95.6 F (35.3 C), temperature source Tympanic, resp. rate 19, height _0  (1.626 m), weight 94 lb 11 oz (42.95 kg). GENERAL:  Thin elderly woman sitting comfortably in the exam room in no acute distress. MENTAL STATUS:  Alert and oriented to person, place and time. HEAD:  Long gray hair.  Temporal wasting.  Normocephalic, atraumatic, face symmetric, no Cushingoid features. EYES:  Blue eyes.  Left eye blind.  Right pupils round and reactive to light.  No conjunctivitis or scleral icterus. ENT:  Oropharynx clear without lesion.  Dentures.  Tongue normal. Mucous membranes moist.  RESPIRATORY:  Clear to auscultation without rales, wheezes or rhonchi. CARDIOVASCULAR:  Regular rate and rhythm without murmur, rub or gallop. ABDOMEN:  Soft, non-tender, with active bowel sounds, and no hepatosplenomegaly.  No masses. SKIN:  No rashes, ulcers or lesions. EXTREMITIES: No edema, no skin discoloration or tenderness.  No palpable cords. LYMPH NODES: No palpable cervical, supraclavicular, axillary or inguinal adenopathy  NEUROLOGICAL: Unremarkable. PSYCH:  Appropriate.  Appointment on 09/24/2014  Component Date Value Ref Range Status  . Retic Ct Pct 09/24/2014 2.0  0.4 - 3.1 % Final  . RBC. 09/24/2014 4.10  3.80 - 5.20 MIL/uL Final  . Retic Count, Manual 09/24/2014 82.0  19.0 - 183.0 K/uL Final  . TSH 09/24/2014 3.054  0.350 - 4.500 uIU/mL Final  Appointment on 09/24/2014  Component Date Value Ref Range Status  . Sodium 09/24/2014 136  135 - 145 mmol/L Final  . Potassium 09/24/2014 4.3  3.5 - 5.1 mmol/L Final  . Chloride 09/24/2014 106  101 - 111 mmol/L Final  . CO2 09/24/2014 26  22 - 32 mmol/L Final  . Glucose, Bld 09/24/2014 89  65 - 99 mg/dL Final  . BUN 09/24/2014 18  6 - 20 mg/dL Final  . Creatinine, Ser 09/24/2014 1.00  0.44 - 1.00 mg/dL Final  . Calcium 09/24/2014 7.9* 8.9 - 10.3 mg/dL  Final  . Total Protein 09/24/2014 6.3* 6.5 - 8.1 g/dL Final  . Albumin 09/24/2014 3.3* 3.5 - 5.0 g/dL Final  . AST 09/24/2014 16  15 - 41 U/L Final  . ALT 09/24/2014 11* 14 - 54 U/L Final  . Alkaline Phosphatase 09/24/2014 72  38 - 126 U/L Final  . Total Bilirubin 09/24/2014 0.4  0.3 - 1.2 mg/dL Final  . GFR calc non Af Amer 09/24/2014 51* >60 mL/min Final  . GFR calc Af Amer 09/24/2014 59* >60 mL/min Final   Comment: (NOTE) The eGFR has been calculated using the CKD EPI equation. This calculation has not been validated in all clinical situations. eGFR's persistently <60 mL/min signify possible Chronic Kidney Disease.   . Anion gap 09/24/2014 4* 5 - 15 Final  . WBC 09/24/2014 6.1  3.6 - 11.0 K/uL Final  .  RBC 09/24/2014 4.12  3.80 - 5.20 MIL/uL Final  . Hemoglobin 09/24/2014 9.1* 12.0 - 16.0 g/dL Final   RESULT REPEATED AND VERIFIED  . HCT 09/24/2014 30.5* 35.0 - 47.0 % Final   RESULT REPEATED AND VERIFIED  . MCV 09/24/2014 74.0* 80.0 - 100.0 fL Final  . MCH 09/24/2014 22.2* 26.0 - 34.0 pg Final  . MCHC 09/24/2014 30.0* 32.0 - 36.0 g/dL Final  . RDW 09/24/2014 19.0* 11.5 - 14.5 % Final  . Platelets 09/24/2014 442* 150 - 440 K/uL Final  . Neutrophils Relative % 09/24/2014 68%   Final  . Neutro Abs 09/24/2014 4.2  1.4 - 6.5 K/uL Final  . Lymphocytes Relative 09/24/2014 11%   Final  . Lymphs Abs 09/24/2014 0.7* 1.0 - 3.6 K/uL Final  . Monocytes Relative 09/24/2014 7%   Final  . Monocytes Absolute 09/24/2014 0.4  0.2 - 0.9 K/uL Final  . Eosinophils Relative 09/24/2014 13%   Final  . Eosinophils Absolute 09/24/2014 0.8* 0 - 0.7 K/uL Final  . Basophils Relative 09/24/2014 1%   Final  . Basophils Absolute 09/24/2014 0.1  0 - 0.1 K/uL Final  . Ferritin 09/24/2014 13  11 - 307 ng/mL Final  . Iron 09/24/2014 19* 28 - 170 ug/dL Final  . TIBC 09/24/2014 322  250 - 450 ug/dL Final  . Saturation Ratios 09/24/2014 6* 10.4 - 31.8 % Final  . UIBC 09/24/2014 303   Final  . Blood Bank Specimen  09/24/2014 SAMPLE AVAILABLE FOR TESTING   Final  . Sample Expiration 09/24/2014 09/27/2014   Final    Assessment:  Kelci Petrella Kunert is a 81 y.o. female with anemia over the past year requiring transfusion in 12/2013.  Labs at that time noted a microcytic anemia consistent with iron deficiency.  Diet is modest.  She denies any hematochezia, hematuria, or vaginal bleeding.  Stool is dark on oral iron.  She has refused EGD and colonoscopy.   Symptomatically, she is fatigued.  She has lost 13 pounds in the past 2 months.  Exam reveals temporal wasting.  Plan: 1. Review entire medical history, diagnosis and management of anemia.  Discuss general lab work-up. Discuss guaiac cards.  Encourage GI evaluation "dont't want one, never had one".    2. Labs today:  CBC with diff, CMP, ferritin, iron studies, retic, TSH, ESR, hold tube. 3. Guaiac cards. 4. Encourage patient to take iron pills with orange juice or vitamin C. 5. RTC in 3 weeks for MD assessment and labs (CBC with diff +/- others).   Lequita Asal, MD  09/24/2014, 6:28 PM

## 2014-10-01 DIAGNOSIS — H34812 Central retinal vein occlusion, left eye: Secondary | ICD-10-CM | POA: Diagnosis not present

## 2014-10-15 ENCOUNTER — Other Ambulatory Visit: Payer: Self-pay

## 2014-10-15 ENCOUNTER — Ambulatory Visit: Payer: Self-pay | Admitting: Hematology and Oncology

## 2014-11-06 ENCOUNTER — Other Ambulatory Visit: Payer: Self-pay | Admitting: Family Medicine

## 2014-11-06 NOTE — Telephone Encounter (Signed)
Patient requesting refill. 

## 2014-12-07 ENCOUNTER — Other Ambulatory Visit: Payer: Self-pay

## 2014-12-07 DIAGNOSIS — D509 Iron deficiency anemia, unspecified: Secondary | ICD-10-CM

## 2014-12-08 ENCOUNTER — Inpatient Hospital Stay: Payer: Medicare Other | Admitting: Hematology and Oncology

## 2014-12-08 ENCOUNTER — Inpatient Hospital Stay: Payer: Medicare Other | Attending: Hematology and Oncology

## 2014-12-28 ENCOUNTER — Encounter: Payer: Self-pay | Admitting: Family Medicine

## 2014-12-28 DIAGNOSIS — Z9289 Personal history of other medical treatment: Secondary | ICD-10-CM | POA: Insufficient documentation

## 2014-12-28 DIAGNOSIS — G3109 Other frontotemporal dementia: Secondary | ICD-10-CM

## 2014-12-28 DIAGNOSIS — R64 Cachexia: Secondary | ICD-10-CM | POA: Insufficient documentation

## 2014-12-28 DIAGNOSIS — N183 Chronic kidney disease, stage 3 unspecified: Secondary | ICD-10-CM | POA: Insufficient documentation

## 2014-12-28 DIAGNOSIS — K144 Atrophy of tongue papillae: Secondary | ICD-10-CM | POA: Insufficient documentation

## 2014-12-28 DIAGNOSIS — F0280 Dementia in other diseases classified elsewhere without behavioral disturbance: Secondary | ICD-10-CM | POA: Insufficient documentation

## 2014-12-28 DIAGNOSIS — L309 Dermatitis, unspecified: Secondary | ICD-10-CM | POA: Insufficient documentation

## 2014-12-28 DIAGNOSIS — G4700 Insomnia, unspecified: Secondary | ICD-10-CM | POA: Insufficient documentation

## 2014-12-28 DIAGNOSIS — M25519 Pain in unspecified shoulder: Secondary | ICD-10-CM | POA: Insufficient documentation

## 2014-12-28 DIAGNOSIS — D509 Iron deficiency anemia, unspecified: Secondary | ICD-10-CM | POA: Insufficient documentation

## 2014-12-28 DIAGNOSIS — F329 Major depressive disorder, single episode, unspecified: Secondary | ICD-10-CM | POA: Insufficient documentation

## 2014-12-28 DIAGNOSIS — L299 Pruritus, unspecified: Secondary | ICD-10-CM | POA: Insufficient documentation

## 2014-12-29 ENCOUNTER — Ambulatory Visit: Payer: Self-pay | Admitting: Family Medicine

## 2015-01-27 ENCOUNTER — Other Ambulatory Visit: Payer: Self-pay | Admitting: Family Medicine

## 2015-01-27 NOTE — Telephone Encounter (Signed)
Patient requesting refill. 

## 2015-02-24 ENCOUNTER — Ambulatory Visit (INDEPENDENT_AMBULATORY_CARE_PROVIDER_SITE_OTHER): Payer: Medicare Other | Admitting: Family Medicine

## 2015-02-24 ENCOUNTER — Encounter: Payer: Self-pay | Admitting: Family Medicine

## 2015-02-24 VITALS — BP 178/62 | HR 78 | Temp 98.3°F | Resp 16 | Ht 64.0 in | Wt 87.9 lb

## 2015-02-24 DIAGNOSIS — N183 Chronic kidney disease, stage 3 unspecified: Secondary | ICD-10-CM

## 2015-02-24 DIAGNOSIS — R110 Nausea: Secondary | ICD-10-CM | POA: Diagnosis not present

## 2015-02-24 DIAGNOSIS — D509 Iron deficiency anemia, unspecified: Secondary | ICD-10-CM | POA: Diagnosis not present

## 2015-02-24 DIAGNOSIS — E034 Atrophy of thyroid (acquired): Secondary | ICD-10-CM | POA: Diagnosis not present

## 2015-02-24 DIAGNOSIS — E038 Other specified hypothyroidism: Secondary | ICD-10-CM | POA: Diagnosis not present

## 2015-02-24 DIAGNOSIS — I10 Essential (primary) hypertension: Secondary | ICD-10-CM | POA: Diagnosis not present

## 2015-02-24 DIAGNOSIS — E46 Unspecified protein-calorie malnutrition: Secondary | ICD-10-CM

## 2015-02-24 MED ORDER — ONDANSETRON 8 MG PO TBDP: 8.0000 mg | ORAL_TABLET | Freq: Three times a day (TID) | ORAL | Status: DC | PRN

## 2015-02-24 MED ORDER — AMLODIPINE BESYLATE 5 MG PO TABS: 5.0000 mg | ORAL_TABLET | Freq: Every day | ORAL | Status: DC

## 2015-02-24 NOTE — Progress Notes (Signed)
Name: Tammy Henry   MRN: [id number]    DOB: 1929-11-19   Date:02/24/2015       Progress Note  Subjective  Chief Complaint  Chief Complaint  Patient presents with  . Abdominal Pain    onset 3 days and had some nausea and vomting  . Cyst    2 weeks increasing in size groin area on right side    HPI  Tammy Henry is a 81 year old female accompanied by her daughter and caretaker today to discuss one time episode of nausea with spitting up food, not necessarily vomiting. Some vague epigastric abdominal pain but not right this minute. No other symptoms such as diarrhea, worsening fatigue, vaginal or rectal bleeding, throat pain, fevers, chills. Associated with mass in the right pelvic area.   On further questioning regarding her high blood pressure today, daughter states she was previously on amlodipine 5 mg a day which was discontinued several months ago. Currently on none.  Using levothyroxine 75 mcg one a day and iron 325 mg one a day consistently. Would like recheck of lab work.   In terms of weight loss patient and daughter deny any change in appetite.   Past Medical History  Diagnosis Date  . Other frontotemporal dementia   . Acute kidney failure, unspecified ([place])   . Osteoporosis, unspecified   . Essential hypertension, benign   . Depressive disorder, not elsewhere classified   . Edema   . Other psoriasis   . Unspecified venous (peripheral) insufficiency   . Anxiety state, unspecified   . Unspecified hypothyroidism   . Functional diarrhea   . Unspecified pruritic disorder   . Other specified disorders of shoulder joint   . Cachexia ([place])   . Dehydration   . Urinary tract infection, site not specified   . Other and unspecified disc disorder of cervical region   . Early satiety   . Iron deficiency anemia, unspecified   . Nonspecific abnormal electrocardiogram (ECG) (EKG)     Patient Active Problem List   Diagnosis Date Noted  . Nausea 02/24/2015  . Cachexia ([place])  12/28/2014  . Chronic eczema 12/28/2014  . Pain in shoulder 12/28/2014  . Chronic kidney disease (CKD), stage III (moderate) 12/28/2014  . Major depressive disorder ([place]) 12/28/2014  . Dementia of frontal lobe type 12/28/2014  . History of blood transfusion 12/28/2014  . Anemia, iron deficiency 12/28/2014  . Insomnia 12/28/2014  . Itch of skin 12/28/2014  . Iron deficiency anemia 12/28/2014  . 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 Adult hypothyroidism 05/10/2009  . Varicose vein 05/10/2009  . Chronic venous insufficiency 02/06/2007  . Hypertension goal BP (blood pressure) < 140/90 07/11/2006  . OP (osteoporosis) 07/11/2006    Social History  Substance Use Topics  . Smoking status: Never Smoker   . Smokeless tobacco: Never Used  . Alcohol Use: No     Current outpatient prescriptions:  .  ferrous sulfate 325 (65 FE) MG tablet, Take 325 mg by mouth daily with breakfast., Disp: , Rfl:  .  levothyroxine (SYNTHROID, LEVOTHROID) 75 MCG tablet, take 1 tablet by mouth daily, Disp: 30 tablet, Rfl: 2 .  triamcinolone cream (KENALOG) 0.1 %, MIX WITH EUCERIN AT HOME AND APPLY TO RASH TWICE A DAY AS DIRECTED by prescriber, Disp: , Rfl: 0  Past Surgical History  Procedure Laterality Date  . Cataract extraction, bilateral      Family History  Problem Relation Age of Onset  . Lymphoma Mother   . Alzheimer's  disease Mother   . Alzheimer's disease Father   . Breast cancer Sister   . Depression Daughter   . Asthma Daughter   . Schizophrenia Daughter   . Thyroid disease Daughter     Allergies  Allergen Reactions  . Lamisil [Terbinafine Hcl]   . Sulfur      Review of Systems  CONSTITUTIONAL: Yes significant weight changes. No fever, chills, weakness or fatigue.  HEENT:  - Eyes: No visual changes.  - Ears: No auditory changes. No pain.  - Nose: No sneezing, congestion, runny nose. - Throat: No sore throat. No changes in swallowing. SKIN: No rash or itching.  CARDIOVASCULAR: No  chest pain, chest pressure or chest discomfort. No palpitations or edema.  RESPIRATORY: No shortness of breath, cough or sputum.  GASTROINTESTINAL: No anorexia. One time episode of nausea, vomiting. No changes in bowel habits. Occasional epigastric abdominal pain.  GENITOURINARY: No dysuria. No frequency. No discharge.  NEUROLOGICAL: No headache, dizziness, syncope, paralysis, ataxia, numbness or tingling in the extremities. No memory changes. No change in bowel or bladder control.  HEMATOLOGIC: Yes anemia. No bleeding or bruising.   PSYCHIATRIC: No change in mood. No change in sleep pattern.  ENDOCRINOLOGIC: No reports of sweating, cold or heat intolerance. No polyuria or polydipsia.     Objective  BP 182/60 mmHg  Pulse 78  Temp(Src) 98.3 F (36.8 C) (Oral)  Resp 16  Ht 5\' 4"  (1.626 m)  Wt 87 lb 14.4 oz (39.871 kg)  BMI 15.08 kg/m2  SpO2 98% Body mass index is 15.08 kg/(m^2).   Repeat BP 173/62  Physical Exam  Constitutional: Patient appears cachectic, thin, well dressed, make up on, walks with shuffling gait. In no distress.  HEENT:  - Head: Normocephalic and atraumatic.  - Ears: Bilateral TMs gray, no erythema or effusion - Nose: Nasal mucosa moist - Mouth/Throat: Oropharynx is clear and moist. No tonsillar hypertrophy or erythema. No post nasal drainage.  - Eyes: Conjunctivae clear, EOM movements normal. PERRLA. No scleral icterus.  Neck: Normal range of motion. Neck supple. No JVD present. No thyromegaly present.  Cardiovascular: Normal rate, regular rhythm and normal heart sounds.  No murmur heard.  Pulmonary/Chest: Effort normal and breath sounds normal. No respiratory distress. Abdomen: Soft, flat, no reproducible tenderness, no guarding, no rebound, no HSM. Mildly prominent freely mobile non tender lymph node in right crus of leg and left crus of leg.  Neurological: CN II-XII grossly intact with no focal deficits. Alert and oriented to person, place, and time.  Skin:  Skin is warm and dry. No rash noted. No erythema.  Psychiatric: Patient has a normal mood and affect. Behavior is normal in office today. Judgment and thought content normal in office today.     Assessment & Plan  1. Hypertension goal BP (blood pressure) < 140/90 Has not followed up in some time. BP elevated, restarted on amlodipine 5 mg a day. Needs close follow up with PCP in 2 weeks.  - CBC with Differential/Platelet - Comprehensive metabolic panel - amLODipine (NORVASC) 5 MG tablet; Take 1 tablet (5 mg total) by mouth daily.  Dispense: 90 tablet; Refill: 1  2. Hypothyroidism due to acquired atrophy of thyroid Due to recheck lab work.  - TSH - T3, free - T4, free  3. Chronic kidney disease (CKD), stage III (moderate) Will reassess CKD status.  - CBC with Differential/Platelet - Comprehensive metabolic panel  4. Anemia, iron deficiency Will reassess anemia status.  - CBC with Differential/Platelet - Comprehensive  metabolic panel - Ferritin - Iron - Iron and TIBC  5. Nausea Vague non specific symptoms and clinically her exam is not impressive other than her thin frame which concerns me. I have prescribed zofran to use as needed and ordered and abdominal US to start with.   - ondansetron (ZOFRAN-ODT) 8 MG disintegrating tablet; Take 1 tablet (8 mg total) by mouth every 8 (eight) hours as needed for nausea or vomiting.  Dispense: 30 tablet; Refill: 1 - Amylase - Lipase - US Abdomen Complete; Future  6. Protein-calorie malnutrition ([place]) I can not exclude poor oral intake of sufficient calories or considering oncological etiology. However I am not familiar with her cancer screening history or her baseline so I have asked her daughter to have her mother follow up with Dr. Ancil Boozer in 2 weeks time.   - CBC with Differential/Platelet - Comprehensive metabolic panel - US Abdomen Complete; Future

## 2015-02-24 NOTE — Patient Instructions (Signed)
Increase consumption of weight gainer Ensure or other protein shake, daily weights keep record.

## 2015-02-25 LAB — IRON AND TIBC
IRON SATURATION: 4 % — AB (ref 15–55)
IRON: 12 ug/dL — AB (ref 27–139)
Total Iron Binding Capacity: 326 ug/dL (ref 250–450)
UIBC: 314 ug/dL (ref 118–369)

## 2015-02-25 LAB — COMPREHENSIVE METABOLIC PANEL
A/G RATIO: 1.4 (ref 1.1–2.5)
ALBUMIN: 3.6 g/dL (ref 3.5–4.7)
ALT: 14 IU/L (ref 0–32)
AST: 18 IU/L (ref 0–40)
Alkaline Phosphatase: 97 IU/L (ref 39–117)
BUN / CREAT RATIO: 24 (ref 11–26)
BUN: 23 mg/dL (ref 8–27)
CALCIUM: 9 mg/dL (ref 8.7–10.3)
CO2: 24 mmol/L (ref 18–29)
Chloride: 102 mmol/L (ref 96–106)
Creatinine, Ser: 0.97 mg/dL (ref 0.57–1.00)
GFR, EST AFRICAN AMERICAN: 62 mL/min/{1.73_m2} (ref >59)
GFR, EST NON AFRICAN AMERICAN: 54 mL/min/{1.73_m2} — AB (ref >59)
GLOBULIN, TOTAL: 2.6 g/dL (ref 1.5–4.5)
Glucose: 60 mg/dL — ABNORMAL LOW (ref 65–99)
POTASSIUM: 4.7 mmol/L (ref 3.5–5.2)
SODIUM: 141 mmol/L (ref 134–144)
TOTAL PROTEIN: 6.2 g/dL (ref 6.0–8.5)

## 2015-02-25 LAB — CBC WITH DIFFERENTIAL/PLATELET
BASOS ABS: 0.1 10*3/uL (ref 0.0–0.2)
Basos: 1 %
EOS (ABSOLUTE): 0.7 10*3/uL — AB (ref 0.0–0.4)
Eos: 9 %
Hematocrit: 30.3 % — ABNORMAL LOW (ref 34.0–46.6)
Hemoglobin: 8.8 g/dL — ABNORMAL LOW (ref 11.1–15.9)
Immature Grans (Abs): 0 10*3/uL (ref 0.0–0.1)
Immature Granulocytes: 1 %
LYMPHS ABS: 0.7 10*3/uL (ref 0.7–3.1)
Lymphs: 10 %
MCH: 19.6 pg — AB (ref 26.6–33.0)
MCHC: 29 g/dL — ABNORMAL LOW (ref 31.5–35.7)
MCV: 68 fL — ABNORMAL LOW (ref 79–97)
Monocytes Absolute: 0.7 10*3/uL (ref 0.1–0.9)
Monocytes: 9 %
NEUTROS PCT: 70 %
Neutrophils Absolute: 5.5 10*3/uL (ref 1.4–7.0)
PLATELETS: 541 10*3/uL — AB (ref 150–379)
RBC: 4.49 x10E6/uL (ref 3.77–5.28)
RDW: 18.9 % — ABNORMAL HIGH (ref 12.3–15.4)
WBC: 7.8 10*3/uL (ref 3.4–10.8)

## 2015-02-25 LAB — T3, FREE: T3, Free: 1.9 pg/mL — ABNORMAL LOW (ref 2.0–4.4)

## 2015-02-25 LAB — TSH: TSH: 1.07 u[IU]/mL (ref 0.450–4.500)

## 2015-02-25 LAB — T4, FREE: FREE T4: 1.17 ng/dL (ref 0.82–1.77)

## 2015-02-25 LAB — FERRITIN: Ferritin: 12 ng/mL — ABNORMAL LOW (ref 15–150)

## 2015-02-25 LAB — LIPASE: LIPASE: 40 U/L (ref 0–59)

## 2015-02-25 LAB — AMYLASE: Amylase: 64 U/L (ref 31–124)

## 2015-03-02 ENCOUNTER — Telehealth: Payer: Self-pay | Admitting: Family Medicine

## 2015-03-02 NOTE — Telephone Encounter (Signed)
Daughter, Tammy Henry, stated that mother is in a lot of pain and is scheduled to have a ultrasound on tomorrow and wanted to know if she could have an RX for some pain medication until then.

## 2015-03-02 NOTE — Telephone Encounter (Signed)
Pts daughter called and would like a call back as soon as you can. Mom is experiencing some pain and pt needs some relief. No availabilities with any providers today. Please return her call.

## 2015-03-02 NOTE — Telephone Encounter (Signed)
Called patients daughter states she is in pain.  Was seen last wed. By Dr. Nadine Counts.  Was not in any pain that day and Dr. Nadine Counts thinks the knot is a lymph node sticking out due to patient being so underweight.  They have an u/s setup for tomorrow am, but wants to see about getting pain meds until then?

## 2015-03-02 NOTE — Telephone Encounter (Signed)
It is already after hours, she needs to go to [facility] if not better

## 2015-03-03 ENCOUNTER — Ambulatory Visit
Admission: RE | Admit: 2015-03-03 | Discharge: 2015-03-03 | Disposition: A | Discharge status: Home / Self Care | Payer: Medicare Other | Source: Ambulatory Visit | Attending: Family Medicine | Admitting: Family Medicine

## 2015-03-03 ENCOUNTER — Other Ambulatory Visit: Payer: Self-pay

## 2015-03-03 DIAGNOSIS — Z23 Encounter for immunization: Secondary | ICD-10-CM | POA: Diagnosis not present

## 2015-03-03 DIAGNOSIS — N183 Chronic kidney disease, stage 3 (moderate): Secondary | ICD-10-CM | POA: Diagnosis present

## 2015-03-03 DIAGNOSIS — R1030 Lower abdominal pain, unspecified: Secondary | ICD-10-CM | POA: Diagnosis not present

## 2015-03-03 DIAGNOSIS — I872 Venous insufficiency (chronic) (peripheral): Secondary | ICD-10-CM | POA: Diagnosis present

## 2015-03-03 DIAGNOSIS — E46 Unspecified protein-calorie malnutrition: Secondary | ICD-10-CM

## 2015-03-03 DIAGNOSIS — I2510 Atherosclerotic heart disease of native coronary artery without angina pectoris: Secondary | ICD-10-CM | POA: Diagnosis present

## 2015-03-03 DIAGNOSIS — K641 Second degree hemorrhoids: Secondary | ICD-10-CM | POA: Diagnosis not present

## 2015-03-03 DIAGNOSIS — R1031 Right lower quadrant pain: Secondary | ICD-10-CM | POA: Diagnosis not present

## 2015-03-03 DIAGNOSIS — Z79899 Other long term (current) drug therapy: Secondary | ICD-10-CM | POA: Diagnosis not present

## 2015-03-03 DIAGNOSIS — F0280 Dementia in other diseases classified elsewhere without behavioral disturbance: Secondary | ICD-10-CM | POA: Diagnosis present

## 2015-03-03 DIAGNOSIS — Z882 Allergy status to sulfonamides status: Secondary | ICD-10-CM | POA: Diagnosis not present

## 2015-03-03 DIAGNOSIS — K561 Intussusception: Secondary | ICD-10-CM | POA: Diagnosis not present

## 2015-03-03 DIAGNOSIS — Z888 Allergy status to other drugs, medicaments and biological substances status: Secondary | ICD-10-CM | POA: Diagnosis not present

## 2015-03-03 DIAGNOSIS — N390 Urinary tract infection, site not specified: Secondary | ICD-10-CM | POA: Diagnosis present

## 2015-03-03 DIAGNOSIS — K5730 Diverticulosis of large intestine without perforation or abscess without bleeding: Secondary | ICD-10-CM | POA: Diagnosis present

## 2015-03-03 DIAGNOSIS — I10 Essential (primary) hypertension: Secondary | ICD-10-CM | POA: Diagnosis not present

## 2015-03-03 DIAGNOSIS — F329 Major depressive disorder, single episode, unspecified: Secondary | ICD-10-CM | POA: Diagnosis present

## 2015-03-03 DIAGNOSIS — B9620 Unspecified Escherichia coli [E. coli] as the cause of diseases classified elsewhere: Secondary | ICD-10-CM | POA: Diagnosis present

## 2015-03-03 DIAGNOSIS — M810 Age-related osteoporosis without current pathological fracture: Secondary | ICD-10-CM | POA: Diagnosis present

## 2015-03-03 DIAGNOSIS — K649 Unspecified hemorrhoids: Secondary | ICD-10-CM | POA: Diagnosis not present

## 2015-03-03 DIAGNOSIS — I129 Hypertensive chronic kidney disease with stage 1 through stage 4 chronic kidney disease, or unspecified chronic kidney disease: Secondary | ICD-10-CM | POA: Diagnosis present

## 2015-03-03 DIAGNOSIS — E039 Hypothyroidism, unspecified: Secondary | ICD-10-CM | POA: Diagnosis present

## 2015-03-03 DIAGNOSIS — N200 Calculus of kidney: Secondary | ICD-10-CM | POA: Diagnosis not present

## 2015-03-03 DIAGNOSIS — Z515 Encounter for palliative care: Secondary | ICD-10-CM | POA: Diagnosis not present

## 2015-03-03 DIAGNOSIS — R110 Nausea: Secondary | ICD-10-CM

## 2015-03-03 DIAGNOSIS — N1330 Unspecified hydronephrosis: Secondary | ICD-10-CM

## 2015-03-03 DIAGNOSIS — F0150 Vascular dementia without behavioral disturbance: Secondary | ICD-10-CM | POA: Diagnosis not present

## 2015-03-03 DIAGNOSIS — D649 Anemia, unspecified: Secondary | ICD-10-CM

## 2015-03-05 ENCOUNTER — Telehealth: Payer: Self-pay | Admitting: Family Medicine

## 2015-03-05 NOTE — Telephone Encounter (Signed)
I am sorry, needs to try Tylenol or go to [facility]

## 2015-03-05 NOTE — Telephone Encounter (Signed)
Patient understood and would like to know if you could give her a prescription for pain. Her mom keeps saying that she hurts.

## 2015-03-05 NOTE — Telephone Encounter (Signed)
She was seen by Dr. Nadine Counts and referral has been made to Oncologist We can discuss other referrals on her next visit

## 2015-03-05 NOTE — Telephone Encounter (Signed)
Mel Almond (daughter) states that her finger nails are turning dark and think it is coming from her kidneys. A referral was suppose to have been placed to the nephrology.

## 2015-03-06 ENCOUNTER — Emergency Department: Payer: Medicare Other

## 2015-03-06 ENCOUNTER — Inpatient Hospital Stay
Admission: EM | Admit: 2015-03-06 | Discharge: 2015-03-09 | DRG: 389 | Disposition: A | Discharge status: Home / Self Care | Payer: Medicare Other | Attending: Surgery | Admitting: Surgery

## 2015-03-06 DIAGNOSIS — I872 Venous insufficiency (chronic) (peripheral): Secondary | ICD-10-CM | POA: Diagnosis present

## 2015-03-06 DIAGNOSIS — K561 Intussusception: Principal | ICD-10-CM

## 2015-03-06 DIAGNOSIS — I2510 Atherosclerotic heart disease of native coronary artery without angina pectoris: Secondary | ICD-10-CM | POA: Diagnosis present

## 2015-03-06 DIAGNOSIS — I10 Essential (primary) hypertension: Secondary | ICD-10-CM | POA: Diagnosis not present

## 2015-03-06 DIAGNOSIS — K649 Unspecified hemorrhoids: Secondary | ICD-10-CM | POA: Diagnosis not present

## 2015-03-06 DIAGNOSIS — Z888 Allergy status to other drugs, medicaments and biological substances status: Secondary | ICD-10-CM | POA: Diagnosis not present

## 2015-03-06 DIAGNOSIS — Z79899 Other long term (current) drug therapy: Secondary | ICD-10-CM

## 2015-03-06 DIAGNOSIS — B9620 Unspecified Escherichia coli [E. coli] as the cause of diseases classified elsewhere: Secondary | ICD-10-CM | POA: Diagnosis present

## 2015-03-06 DIAGNOSIS — Z882 Allergy status to sulfonamides status: Secondary | ICD-10-CM

## 2015-03-06 DIAGNOSIS — N200 Calculus of kidney: Secondary | ICD-10-CM | POA: Diagnosis not present

## 2015-03-06 DIAGNOSIS — M810 Age-related osteoporosis without current pathological fracture: Secondary | ICD-10-CM | POA: Diagnosis present

## 2015-03-06 DIAGNOSIS — K5730 Diverticulosis of large intestine without perforation or abscess without bleeding: Secondary | ICD-10-CM | POA: Diagnosis present

## 2015-03-06 DIAGNOSIS — N1330 Unspecified hydronephrosis: Secondary | ICD-10-CM | POA: Diagnosis not present

## 2015-03-06 DIAGNOSIS — F329 Major depressive disorder, single episode, unspecified: Secondary | ICD-10-CM | POA: Diagnosis present

## 2015-03-06 DIAGNOSIS — F0150 Vascular dementia without behavioral disturbance: Secondary | ICD-10-CM | POA: Diagnosis not present

## 2015-03-06 DIAGNOSIS — R1030 Lower abdominal pain, unspecified: Secondary | ICD-10-CM | POA: Insufficient documentation

## 2015-03-06 DIAGNOSIS — K641 Second degree hemorrhoids: Secondary | ICD-10-CM | POA: Diagnosis not present

## 2015-03-06 DIAGNOSIS — F0280 Dementia in other diseases classified elsewhere without behavioral disturbance: Secondary | ICD-10-CM | POA: Diagnosis present

## 2015-03-06 DIAGNOSIS — N183 Chronic kidney disease, stage 3 (moderate): Secondary | ICD-10-CM | POA: Diagnosis present

## 2015-03-06 DIAGNOSIS — I129 Hypertensive chronic kidney disease with stage 1 through stage 4 chronic kidney disease, or unspecified chronic kidney disease: Secondary | ICD-10-CM | POA: Diagnosis present

## 2015-03-06 DIAGNOSIS — Z23 Encounter for immunization: Secondary | ICD-10-CM

## 2015-03-06 DIAGNOSIS — Z515 Encounter for palliative care: Secondary | ICD-10-CM | POA: Diagnosis not present

## 2015-03-06 DIAGNOSIS — R1031 Right lower quadrant pain: Secondary | ICD-10-CM | POA: Diagnosis not present

## 2015-03-06 DIAGNOSIS — N390 Urinary tract infection, site not specified: Secondary | ICD-10-CM | POA: Diagnosis present

## 2015-03-06 DIAGNOSIS — E039 Hypothyroidism, unspecified: Secondary | ICD-10-CM | POA: Diagnosis present

## 2015-03-06 DIAGNOSIS — E46 Unspecified protein-calorie malnutrition: Secondary | ICD-10-CM | POA: Diagnosis not present

## 2015-03-06 LAB — URINALYSIS COMPLETE WITH MICROSCOPIC (ARMC ONLY)
Bilirubin Urine: NEGATIVE
Glucose, UA: NEGATIVE mg/dL
HGB URINE DIPSTICK: NEGATIVE
KETONES UR: NEGATIVE mg/dL
NITRITE: NEGATIVE
PH: 5 (ref 5.0–8.0)
PROTEIN: NEGATIVE mg/dL
SPECIFIC GRAVITY, URINE: 1.019 (ref 1.005–1.030)

## 2015-03-06 LAB — COMPREHENSIVE METABOLIC PANEL
ALBUMIN: 3.4 g/dL — AB (ref 3.5–5.0)
ALT: 14 U/L (ref 14–54)
ANION GAP: 7 (ref 5–15)
AST: 15 U/L (ref 15–41)
Alkaline Phosphatase: 93 U/L (ref 38–126)
BILIRUBIN TOTAL: 1 mg/dL (ref 0.3–1.2)
BUN: 28 mg/dL — ABNORMAL HIGH (ref 6–20)
CHLORIDE: 99 mmol/L — AB (ref 101–111)
CO2: 31 mmol/L (ref 22–32)
Calcium: 9.4 mg/dL (ref 8.9–10.3)
Creatinine, Ser: 1.02 mg/dL — ABNORMAL HIGH (ref 0.44–1.00)
GFR calc Af Amer: 57 mL/min — ABNORMAL LOW (ref >60)
GFR, EST NON AFRICAN AMERICAN: 49 mL/min — AB (ref >60)
Glucose, Bld: 103 mg/dL — ABNORMAL HIGH (ref 65–99)
POTASSIUM: 4.5 mmol/L (ref 3.5–5.1)
Sodium: 137 mmol/L (ref 135–145)
TOTAL PROTEIN: 6.9 g/dL (ref 6.5–8.1)

## 2015-03-06 LAB — LIPASE, BLOOD: LIPASE: 24 U/L (ref 11–51)

## 2015-03-06 LAB — CBC
HEMATOCRIT: 32.2 % — AB (ref 35.0–47.0)
HEMOGLOBIN: 9.8 g/dL — AB (ref 12.0–16.0)
MCH: 19.5 pg — ABNORMAL LOW (ref 26.0–34.0)
MCHC: 30.5 g/dL — ABNORMAL LOW (ref 32.0–36.0)
MCV: 64.2 fL — ABNORMAL LOW (ref 80.0–100.0)
Platelets: 605 10*3/uL — ABNORMAL HIGH (ref 150–440)
RBC: 5.01 MIL/uL (ref 3.80–5.20)
RDW: 19.9 % — ABNORMAL HIGH (ref 11.5–14.5)
WBC: 10.4 10*3/uL (ref 3.6–11.0)

## 2015-03-06 MED ORDER — MORPHINE SULFATE (PF) 2 MG/ML IV SOLN
2.0000 mg | Freq: Once | INTRAVENOUS | Status: AC
  Administered 2015-03-06: 2 mg via INTRAVENOUS
  Filled 2015-03-06: qty 1

## 2015-03-06 MED ORDER — IOHEXOL 240 MG/ML SOLN
25.0000 mL | INTRAMUSCULAR | Status: AC
  Administered 2015-03-06: 25 mL via ORAL

## 2015-03-06 MED ORDER — HYDRALAZINE HCL 20 MG/ML IJ SOLN: 10.0000 mg | INTRAMUSCULAR | Status: DC | PRN

## 2015-03-06 MED ORDER — ONDANSETRON HCL 4 MG/2ML IJ SOLN: 4.0000 mg | Freq: Once | INTRAMUSCULAR | Status: DC

## 2015-03-06 MED ORDER — HEPARIN SODIUM (PORCINE) 5000 UNIT/ML IJ SOLN
5000.0000 [IU] | Freq: Three times a day (TID) | INTRAMUSCULAR | Status: DC
  Administered 2015-03-07 – 2015-03-09 (×9): 5000 [IU] via SUBCUTANEOUS
  Filled 2015-03-06 (×10): qty 1

## 2015-03-06 MED ORDER — IOHEXOL 300 MG/ML  SOLN
50.0000 mL | Freq: Once | INTRAMUSCULAR | Status: AC | PRN
  Administered 2015-03-06: 50 mL via INTRAVENOUS

## 2015-03-06 MED ORDER — ONDANSETRON HCL 4 MG/2ML IJ SOLN
4.0000 mg | Freq: Once | INTRAMUSCULAR | Status: AC
Start: 2015-03-06 — End: 2015-03-06
  Administered 2015-03-06: 4 mg via INTRAVENOUS
  Filled 2015-03-06: qty 2

## 2015-03-06 MED ORDER — ONDANSETRON HCL 4 MG PO TABS: 4.0000 mg | ORAL_TABLET | Freq: Four times a day (QID) | ORAL | Status: DC | PRN

## 2015-03-06 MED ORDER — HALOPERIDOL LACTATE 5 MG/ML IJ SOLN
5.0000 mg | Freq: Once | INTRAMUSCULAR | Status: AC
  Administered 2015-03-06: 5 mg via INTRAVENOUS

## 2015-03-06 MED ORDER — ONDANSETRON HCL 4 MG/2ML IJ SOLN
INTRAMUSCULAR | Status: AC
  Filled 2015-03-06: qty 2

## 2015-03-06 MED ORDER — PIPERACILLIN-TAZOBACTAM 3.375 G IVPB
3.3750 g | Freq: Once | INTRAVENOUS | Status: AC
  Administered 2015-03-06: 3.375 g via INTRAVENOUS
  Filled 2015-03-06: qty 50

## 2015-03-06 MED ORDER — SODIUM CHLORIDE 0.9 % IV BOLUS (SEPSIS)
500.0000 mL | Freq: Once | INTRAVENOUS | Status: AC
  Administered 2015-03-06: 500 mL via INTRAVENOUS

## 2015-03-06 MED ORDER — AMLODIPINE BESYLATE 5 MG PO TABS
5.0000 mg | ORAL_TABLET | Freq: Every day | ORAL | Status: DC
  Administered 2015-03-07 – 2015-03-08 (×3): 5 mg via ORAL
  Filled 2015-03-06 (×3): qty 1

## 2015-03-06 MED ORDER — MORPHINE SULFATE (PF) 2 MG/ML IV SOLN: 1.0000 mg | INTRAVENOUS | Status: DC | PRN

## 2015-03-06 MED ORDER — ONDANSETRON HCL 4 MG/2ML IJ SOLN: 4.0000 mg | Freq: Four times a day (QID) | INTRAMUSCULAR | Status: DC | PRN

## 2015-03-06 MED ORDER — SODIUM CHLORIDE 0.9 % IV BOLUS (SEPSIS)
50.0000 mL | Freq: Once | INTRAVENOUS | Status: AC
  Administered 2015-03-06: 50 mL via INTRAVENOUS

## 2015-03-06 MED ORDER — LEVOTHYROXINE SODIUM 75 MCG PO TABS
75.0000 ug | ORAL_TABLET | Freq: Every day | ORAL | Status: DC
  Administered 2015-03-07 – 2015-03-08 (×2): 75 ug via ORAL
  Filled 2015-03-06 (×2): qty 1

## 2015-03-06 MED ORDER — HALOPERIDOL LACTATE 5 MG/ML IJ SOLN
INTRAMUSCULAR | Status: AC
  Administered 2015-03-06: 5 mg via INTRAVENOUS
  Filled 2015-03-06: qty 1

## 2015-03-06 MED ORDER — KCL IN DEXTROSE-NACL 20-5-0.45 MEQ/L-%-% IV SOLN
INTRAVENOUS | Status: DC
  Administered 2015-03-07 – 2015-03-09 (×7): via INTRAVENOUS
  Filled 2015-03-06 (×12): qty 1000

## 2015-03-06 MED ORDER — DEXTROSE 5 % IV SOLN
1.0000 g | INTRAVENOUS | Status: DC
  Administered 2015-03-07 (×2): 1 g via INTRAVENOUS
  Filled 2015-03-06 (×3): qty 10

## 2015-03-06 NOTE — ED Notes (Signed)
CT called, 3 ppl ahead, pt nauseous, Dr Edd Fabian

## 2015-03-06 NOTE — Consult Note (Signed)
[facility] [place] NAME: Tammy Henry    MR#:  [id number]  DATE OF BIRTH:  [birth year]   DATE OF ADMISSION:  03/06/2015  PRIMARY CARE PHYSICIAN: Loistine Chance, MD   REQUESTING/REFERRING PHYSICIAN: Burt Knack  CHIEF COMPLAINT:   Chief Complaint  Patient presents with  . Abdominal Pain  . Flank Pain    HISTORY OF PRESENT ILLNESS:  Tammy Henry  is a 81 y.o. female with a known history of dementia, essential hypertension who is presenting with abdominal pain. The patient herself is unable to provide meaningful information given mental status/medical condition. Apparently she has been experiencing abdominal pain right lower quadrant which is been intermittent but progressively worsening. Patient currently denies that symptoms and is unable to further qualify/quantify. She had an abdominal ultrasound performed which revealed hydronephrosis and his was subsequently sent to the hospital for further workup and evaluation. Throughout her emergency department stay workup revealed intussusception she was then evaluated and admitted by general surgery requested medical consult for her multitude of medical issues.  PAST MEDICAL HISTORY:   Past Medical History  Diagnosis Date  . Other frontotemporal dementia   . Acute kidney failure, unspecified ([place])   . Osteoporosis, unspecified   . Essential hypertension, benign   . Depressive disorder, not elsewhere classified   . Edema   . Other psoriasis   . Unspecified venous (peripheral) insufficiency   . Anxiety state, unspecified   . Unspecified hypothyroidism   . Functional diarrhea   . Unspecified pruritic disorder   . Other specified disorders of shoulder joint   . Cachexia ([place])   . Dehydration   . Urinary tract infection, site not specified   . Other and unspecified disc disorder of cervical region   . Early satiety   . Iron deficiency anemia, unspecified   . Nonspecific abnormal  electrocardiogram (ECG) (EKG)     PAST SURGICAL HISTORY:   Past Surgical History  Procedure Laterality Date  . Cataract extraction, bilateral      SOCIAL HISTORY:   Social History  Substance Use Topics  . Smoking status: Never Smoker   . Smokeless tobacco: Never Used  . Alcohol Use: No    FAMILY HISTORY:   Family History  Problem Relation Age of Onset  . Lymphoma Mother   . Alzheimer's disease Mother   . Alzheimer's disease Father   . Breast cancer Sister   . Depression Daughter   . Asthma Daughter   . Schizophrenia Daughter   . Thyroid disease Daughter     DRUG ALLERGIES:   Allergies  Allergen Reactions  . Lamisil [Terbinafine Hcl]   . Sulfur     REVIEW OF SYSTEMS:  Unable to obtain given patient's mental status/medical condition  MEDICATIONS AT HOME:   Prior to Admission medications   Medication Sig Start Date End Date Taking? Authorizing Provider  amLODipine (NORVASC) 5 MG tablet Take 1 tablet (5 mg total) by mouth daily. 02/24/15  Yes Bobetta Lime, MD  ferrous sulfate 325 (65 FE) MG tablet Take 325 mg by mouth daily with breakfast.   Yes Historical Provider, MD  levothyroxine (SYNTHROID, LEVOTHROID) 75 MCG tablet take 1 tablet by mouth daily 02/01/15  Yes Ashok Norris, MD  Multiple Vitamin (MULTIVITAMIN WITH MINERALS) TABS tablet Take 1 tablet by mouth daily.   Yes Historical Provider, MD  ondansetron (ZOFRAN-ODT) 8 MG disintegrating tablet Take 1 tablet (8 mg total) by mouth every 8 (eight) hours as needed  for nausea or vomiting. Patient not taking: Reported on 03/06/2015 02/24/15   Bobetta Lime, MD      VITAL SIGNS:  Blood pressure 171/56, pulse 59, temperature 97.7 F (36.5 C), temperature source Oral, resp. rate 17, height 5\' 4"  (1.626 m), weight 79 lb (35.834 kg), SpO2 98 %.  PHYSICAL EXAMINATION:  VITAL SIGNS: Filed Vitals:   03/06/15 2030 03/06/15 2204  BP: 162/64 171/56  Pulse: 58 59  Temp:    Resp: 13 20   GENERAL:81  y.o.female currently in no acute distress. Chronically ill-appearing HEAD: Normocephalic, atraumatic.  EYES: Pupils equal, round, reactive to light. Extraocular muscles intact. No scleral icterus.  MOUTH: Dry mucosal membrane. Dentition intact. No abscess noted.  EAR, NOSE, THROAT: Clear without exudates. No external lesions.  NECK: Supple. No thyromegaly. No nodules. No JVD.  PULMONARY: Clear to ascultation, without wheeze rails or rhonci. No use of accessory muscles, Good respiratory effort. good air entry bilaterally CHEST: Nontender to palpation.  CARDIOVASCULAR: S1 and S2. Regular rate and rhythm. No murmurs, rubs, or gallops. No edema. Pedal pulses 2+ bilaterally.  GASTROINTESTINAL: Soft, nontender, nondistended. No masses. Positive bowel sounds. No hepatosplenomegaly.  MUSCULOSKELETAL: No swelling, clubbing, or edema. Range of motion full in all extremities.  NEUROLOGIC: Cranial nerves II through XII are intact. No gross focal neurological deficits. Sensation intact. Reflexes intact.  SKIN: No ulceration, lesions, rashes, or cyanosis. Skin warm and dry. Turgor intact.  PSYCHIATRIC: Mood, affect blunted The patient is awake, alert and oriented x self only, judgment poor.    LABORATORY PANEL:   CBC  Recent Labs Lab 03/06/15 1547  WBC 10.4  HGB 9.8*  HCT 32.2*  PLT 605*   ------------------------------------------------------------------------------------------------------------------  Chemistries   Recent Labs Lab 03/06/15 1547  NA 137  K 4.5  CL 99*  CO2 31  GLUCOSE 103*  BUN 28*  CREATININE 1.02*  CALCIUM 9.4  AST 15  ALT 14  ALKPHOS 93  BILITOT 1.0   ------------------------------------------------------------------------------------------------------------------  Cardiac Enzymes No results for input(s): TROPONINI in the last 168  hours. ------------------------------------------------------------------------------------------------------------------  RADIOLOGY:  Ct Abdomen Pelvis W Contrast  03/06/2015  CLINICAL DATA:  81 year old female this history of abdominal pain for the past 2 days. Right-sided hydronephrosis noted on recent ultrasound examination. EXAM: CT ABDOMEN AND PELVIS WITH CONTRAST TECHNIQUE: Multidetector CT imaging of the abdomen and pelvis was performed using the standard protocol following bolus administration of intravenous contrast. CONTRAST:  14mL OMNIPAQUE IOHEXOL 300 MG/ML  SOLN COMPARISON:  No priors.  Abdominal ultrasound 03/03/2015. FINDINGS: Lower chest: Mild scarring in the visualize lung bases. Mild cardiomegaly. Atherosclerotic calcifications in the left main, left anterior descending and right coronary arteries. Hepatobiliary: No suspicious cystic or solid hepatic lesions. No intra or extrahepatic biliary ductal dilatation. Gallbladder is normal in appearance. Pancreas: No pancreatic mass. No pancreatic ductal dilatation. No pancreatic or peripancreatic fluid or inflammatory changes. Spleen: Unremarkable. Adrenals/Urinary Tract: Moderate to severe right hydronephrosis and mild left hydronephrosis. Ureters do not appear to be dilated bilaterally. Urinary bladder is normal in appearance. No suspicious renal lesions are identified. 4 mm nonobstructive calculus in the lower pole collecting system of the right kidney. Sub cm low-attenuation lesion in the upper pole the right kidney is too small to definitively characterize, but is statistically likely a tiny cyst. Bilateral adrenal glands are normal in appearance. Stomach/Bowel: Stomach is normal in appearance. No pathologic dilatation of small bowel or colon. There is a long segment of intussusception involving the cecum, ascending colon and transverse colon  to the level of the mid transverse colon. Some gas and stool is noted throughout the distal colon and  rectum. Several scattered colonic diverticulae are noted, without surrounding inflammatory changes to suggest an acute diverticulitis at this time. Vascular/Lymphatic: Atherosclerosis throughout the abdominal and pelvic vasculature, without evidence of aneurysm or dissection. No lymphadenopathy noted in the abdomen or pelvis. Reproductive: Uterus and ovaries are atrophic. Other: No significant volume of ascites.  No pneumoperitoneum. Musculoskeletal: There are no aggressive appearing lytic or blastic lesions noted in the visualized portions of the skeleton. Chronic appearing compression fracture of L3 with approximately 50% loss of anterior vertebral body height. IMPRESSION: 1. Long segment intussusception involving the cecum, ascending colon and transverse colon to the level of the mid transverse colon. Despite this finding, there does not appear to be overt bowel obstruction at this time. Additionally, no signs of frank perforation are noted at this time. Surgical consultation is strongly recommended. 2. Moderate to severe right and mild left hydronephrosis. No ureteral dilatation. These findings Parmer suggest bilaterally UPJ obstruction. 3. 4 mm nonobstructive calculus in the lower pole collecting system of the right kidney. No ureteral stones. 4. Colonic diverticulosis 5. Extensive atherosclerosis, including left main and 2 vessel coronary artery disease. 6. Additional incidental findings, as above. These results were called by telephone at the time of interpretation on 03/06/2015 at 9:44 pm to Dr. Loura Pardon, who verbally acknowledged these results. Electronically Signed   By: Vinnie Langton M.D.   On: 03/06/2015 21:43    EKG:   Orders placed or performed in visit on 12/17/13  . EKG 12-Lead  . EKG 12-Lead    IMPRESSION AND PLAN:   81 year old Caucasian female with history of dementia as well as essential hypertension who originally presented to the hospital 03/06/2015 for abdominal pain found to  have intussusception being admitted and treated by general surgery.  1. Urinary tract infection: Ceftriaxone for antibiotic coverage follow culture data 2. Essential hypertension: Restart oral medications when able to tolerate by mouth in the interim will add as needed hydralazine 3. Hydronephrosis: No evidence of acute kidney injury continue with IV fluid hydration and monitor urine output/renal function 4. Intussusception: Care per primary team     All the records are reviewed and case discussed with ED provider. Management plans discussed with the patient, family and they are in agreement.  CODE STATUS: Full  TOTAL TIME TAKING CARE OF THIS PATIENT: 35 minutes.    Ramie Erman,  Karenann Cai.D on 03/06/2015 at 10:43 PM  Between 7am to 6pm - Pager - [phone]  After 6pm: House Pager: - [phone]  [facility]  Office  [phone]  CC: Primary care physician; Loistine Chance, MD

## 2015-03-06 NOTE — H&P (Signed)
Tammy Henry is an 81 y.o. female.    Chief Complaint: Abdominal pain  HPI: This patient with 2 days of abdominal pain is unclear she's had this happen before she has not had a bowel movement today and has trouble with bowel movements. Much of her history comes from her daughter who she lives with and the patient does not answer questions herself very easily. Review of systems is not obtainable for that reason. She has not vomited today. Denies fevers or chills.  Past Medical History  Diagnosis Date  . Other frontotemporal dementia   . Acute kidney failure, unspecified ([place])   . Osteoporosis, unspecified   . Essential hypertension, benign   . Depressive disorder, not elsewhere classified   . Edema   . Other psoriasis   . Unspecified venous (peripheral) insufficiency   . Anxiety state, unspecified   . Unspecified hypothyroidism   . Functional diarrhea   . Unspecified pruritic disorder   . Other specified disorders of shoulder joint   . Cachexia ([place])   . Dehydration   . Urinary tract infection, site not specified   . Other and unspecified disc disorder of cervical region   . Early satiety   . Iron deficiency anemia, unspecified   . Nonspecific abnormal electrocardiogram (ECG) (EKG)     Past Surgical History  Procedure Laterality Date  . Cataract extraction, bilateral      Family History  Problem Relation Age of Onset  . Lymphoma Mother   . Alzheimer's disease Mother   . Alzheimer's disease Father   . Breast cancer Sister   . Depression Daughter   . Asthma Daughter   . Schizophrenia Daughter   . Thyroid disease Daughter    Social History:  reports that she has never smoked. She has never used smokeless tobacco. She reports that she does not drink alcohol or use illicit drugs.  Allergies:  Allergies  Allergen Reactions  . Lamisil [Terbinafine Hcl]   . Sulfur      (Not in a hospital admission)   Review of Systems  Unable to perform ROS: mental acuity      Physical Exam:  BP 171/56 mmHg  Pulse 59  Temp(Src) 97.7 F (36.5 C) (Oral)  Resp 17  Ht 5' 4"  (1.626 m)  Wt 79 lb (35.834 kg)  BMI 13.55 kg/m2  SpO2 98%  Physical Exam  Constitutional:  Cachectic intracted patient who speaks very little when she does it is coherent  HENT:  Head: Normocephalic and atraumatic.  Eyes: Pupils are equal, round, and reactive to light. Right eye exhibits no discharge. Left eye exhibits no discharge. No scleral icterus.  Neck: Normal range of motion.  Cardiovascular: Normal rate, regular rhythm and normal heart sounds.   Pulmonary/Chest: Effort normal and breath sounds normal. No respiratory distress. She has no wheezes.  Abdominal: Soft. Bowel sounds are normal. She exhibits distension. There is no tenderness. There is no rebound and no guarding.  Musculoskeletal: She exhibits no edema or tenderness.  Cachectic muscle wasted and contracted  Lymphadenopathy:    She has no cervical adenopathy.  Neurological: She is alert.  Skin: Skin is warm and dry. Rash noted.  Chronic appearing rash  Psychiatric: Affect normal.  Vitals reviewed.       Results for orders placed or performed during the hospital encounter of 03/06/15 (from the past 48 hour(s))  Lipase, blood     Status: None   Collection Time: 03/06/15  3:47 PM  Result Value Ref Range  Lipase 24 11 - 51 U/L  Comprehensive metabolic panel     Status: Abnormal   Collection Time: 03/06/15  3:47 PM  Result Value Ref Range   Sodium 137 135 - 145 mmol/L   Potassium 4.5 3.5 - 5.1 mmol/L   Chloride 99 (L) 101 - 111 mmol/L   CO2 31 22 - 32 mmol/L   Glucose, Bld 103 (H) 65 - 99 mg/dL   BUN 28 (H) 6 - 20 mg/dL   Creatinine, Ser 1.02 (H) 0.44 - 1.00 mg/dL   Calcium 9.4 8.9 - 10.3 mg/dL   Total Protein 6.9 6.5 - 8.1 g/dL   Albumin 3.4 (L) 3.5 - 5.0 g/dL   AST 15 15 - 41 U/L   ALT 14 14 - 54 U/L   Alkaline Phosphatase 93 38 - 126 U/L   Total Bilirubin 1.0 0.3 - 1.2 mg/dL    Comment:  RESULT CONFIRMED BY MANUAL DILUTION   GFR calc non Af Amer 49 (L) >60 mL/min   GFR calc Af Amer 57 (L) >60 mL/min    Comment: (NOTE) The eGFR has been calculated using the CKD EPI equation. This calculation has not been validated in all clinical situations. eGFR's persistently <60 mL/min signify possible Chronic Kidney Disease.    Anion gap 7 5 - 15  CBC     Status: Abnormal   Collection Time: 03/06/15  3:47 PM  Result Value Ref Range   WBC 10.4 3.6 - 11.0 K/uL   RBC 5.01 3.80 - 5.20 MIL/uL   Hemoglobin 9.8 (L) 12.0 - 16.0 g/dL   HCT 32.2 (L) 35.0 - 47.0 %   MCV 64.2 (L) 80.0 - 100.0 fL   MCH 19.5 (L) 26.0 - 34.0 pg   MCHC 30.5 (L) 32.0 - 36.0 g/dL   RDW 19.9 (H) 11.5 - 14.5 %   Platelets 605 (H) 150 - 440 K/uL  Urinalysis complete, with microscopic ([facility] only)     Status: Abnormal   Collection Time: 03/06/15  3:47 PM  Result Value Ref Range   Color, Urine YELLOW (A) YELLOW   APPearance HAZY (A) CLEAR   Glucose, UA NEGATIVE NEGATIVE mg/dL   Bilirubin Urine NEGATIVE NEGATIVE   Ketones, ur NEGATIVE NEGATIVE mg/dL   Specific Gravity, Urine 1.019 1.005 - 1.030   Hgb urine dipstick NEGATIVE NEGATIVE   pH 5.0 5.0 - 8.0   Protein, ur NEGATIVE NEGATIVE mg/dL   Nitrite NEGATIVE NEGATIVE   Leukocytes, UA 2+ (A) NEGATIVE   RBC / HPF 0-5 0 - 5 RBC/hpf   WBC, UA TOO NUMEROUS TO COUNT 0 - 5 WBC/hpf   Bacteria, UA MANY (A) NONE SEEN   Squamous Epithelial / LPF 0-5 (A) NONE SEEN   Mucous PRESENT    Hyaline Casts, UA PRESENT    Ct Abdomen Pelvis W Contrast  03/06/2015  CLINICAL DATA:  81 year old female this history of abdominal pain for the past 2 days. Right-sided hydronephrosis noted on recent ultrasound examination. EXAM: CT ABDOMEN AND PELVIS WITH CONTRAST TECHNIQUE: Multidetector CT imaging of the abdomen and pelvis was performed using the standard protocol following bolus administration of intravenous contrast. CONTRAST:  69m OMNIPAQUE IOHEXOL 300 MG/ML  SOLN COMPARISON:  No  priors.  Abdominal ultrasound 03/03/2015. FINDINGS: Lower chest: Mild scarring in the visualize lung bases. Mild cardiomegaly. Atherosclerotic calcifications in the left main, left anterior descending and right coronary arteries. Hepatobiliary: No suspicious cystic or solid hepatic lesions. No intra or extrahepatic biliary ductal dilatation. Gallbladder is normal in  appearance. Pancreas: No pancreatic mass. No pancreatic ductal dilatation. No pancreatic or peripancreatic fluid or inflammatory changes. Spleen: Unremarkable. Adrenals/Urinary Tract: Moderate to severe right hydronephrosis and mild left hydronephrosis. Ureters do not appear to be dilated bilaterally. Urinary bladder is normal in appearance. No suspicious renal lesions are identified. 4 mm nonobstructive calculus in the lower pole collecting system of the right kidney. Sub cm low-attenuation lesion in the upper pole the right kidney is too small to definitively characterize, but is statistically likely a tiny cyst. Bilateral adrenal glands are normal in appearance. Stomach/Bowel: Stomach is normal in appearance. No pathologic dilatation of small bowel or colon. There is a long segment of intussusception involving the cecum, ascending colon and transverse colon to the level of the mid transverse colon. Some gas and stool is noted throughout the distal colon and rectum. Several scattered colonic diverticulae are noted, without surrounding inflammatory changes to suggest an acute diverticulitis at this time. Vascular/Lymphatic: Atherosclerosis throughout the abdominal and pelvic vasculature, without evidence of aneurysm or dissection. No lymphadenopathy noted in the abdomen or pelvis. Reproductive: Uterus and ovaries are atrophic. Other: No significant volume of ascites.  No pneumoperitoneum. Musculoskeletal: There are no aggressive appearing lytic or blastic lesions noted in the visualized portions of the skeleton. Chronic appearing compression fracture of  L3 with approximately 50% loss of anterior vertebral body height. IMPRESSION: 1. Long segment intussusception involving the cecum, ascending colon and transverse colon to the level of the mid transverse colon. Despite this finding, there does not appear to be overt bowel obstruction at this time. Additionally, no signs of frank perforation are noted at this time. Surgical consultation is strongly recommended. 2. Moderate to severe right and mild left hydronephrosis. No ureteral dilatation. These findings [place] suggest bilaterally UPJ obstruction. 3. 4 mm nonobstructive calculus in the lower pole collecting system of the right kidney. No ureteral stones. 4. Colonic diverticulosis 5. Extensive atherosclerosis, including left main and 2 vessel coronary artery disease. 6. Additional incidental findings, as above. These results were called by telephone at the time of interpretation on 03/06/2015 at 9:44 pm to Dr. Loura Pardon, who verbally acknowledged these results. Electronically Signed   By: Vinnie Langton M.D.   On: 03/06/2015 21:43     Assessment/Plan  Ileocolonic intussusception of an apparent chronic nature there is no surrounding inflammation. CT scan is personally reviewed. Her labs are normal but she does have a UTI as well. I discussed with daughter CODE STATUS which has not been addressed with other family members nor with the patient. Therefore she is a full code. I also discussed potential for surgery and that has not been addressed either. With that in mind I would recommend admitting to the hospital placing a Foley and a nasogastric tube and attempt at conservative measures. This [place] require a Gastrografin enema but I did discuss the likelihood of recurrence at a high level. If that is the case and surgical intervention is the only opportunity to give long-lasting results. The family will discuss options and make decisions concerning surgery and CODE STATUS in the future.  Florene Glen, MD,  FACS

## 2015-03-06 NOTE — ED Provider Notes (Signed)
[facility] Emergency Department Provider Note  ____________________________________________  Time seen: Approximately 7:07 PM  I have reviewed the triage vital signs and the nursing notes.   HISTORY  Chief Complaint Abdominal Pain and Flank Pain  Historians the patient as well as her daughter at bedside.  HPI Tammy Henry is a 81 y.o. female history dementia, hypertension, cachexia is for evaluation of naproxen one week of right-sided abdominal pain as well as right lower quadrant pain, gradual onset, constant since onset, currently moderate, no modifying factors. She was seen by her primary care doctor for this and had an renal ultrasound which showed "swelling of my kidneys". No vomiting or diarrhea, fevers, chills, chest pain or difficulty breathing. She denies any pain or burning with urination.   Past Medical History  Diagnosis Date  . Other frontotemporal dementia   . Acute kidney failure, unspecified ([place])   . Osteoporosis, unspecified   . Essential hypertension, benign   . Depressive disorder, not elsewhere classified   . Edema   . Other psoriasis   . Unspecified venous (peripheral) insufficiency   . Anxiety state, unspecified   . Unspecified hypothyroidism   . Functional diarrhea   . Unspecified pruritic disorder   . Other specified disorders of shoulder joint   . Cachexia ([place])   . Dehydration   . Urinary tract infection, site not specified   . Other and unspecified disc disorder of cervical region   . Early satiety   . Iron deficiency anemia, unspecified   . Nonspecific abnormal electrocardiogram (ECG) (EKG)     Patient Active Problem List   Diagnosis Date Noted  . Nausea 02/24/2015  . Protein-calorie malnutrition ([place]) 02/24/2015  . Cachexia ([place]) 12/28/2014  . Chronic eczema 12/28/2014  . Pain in shoulder 12/28/2014  . Chronic kidney disease (CKD), stage III (moderate) 12/28/2014  . Major depressive disorder ([place]) 12/28/2014   . Dementia of frontal lobe type 12/28/2014  . History of blood transfusion 12/28/2014  . Anemia, iron deficiency 12/28/2014  . Insomnia 12/28/2014  . Itch of skin 12/28/2014  . Iron deficiency anemia 12/28/2014  . 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 Adult hypothyroidism 05/10/2009  . Varicose vein 05/10/2009  . Chronic venous insufficiency 02/06/2007  . Hypertension goal BP (blood pressure) < 140/90 07/11/2006  . OP (osteoporosis) 07/11/2006    Past Surgical History  Procedure Laterality Date  . Cataract extraction, bilateral      Current Outpatient Rx  Name  Route  Sig  Dispense  Refill  . amLODipine (NORVASC) 5 MG tablet   Oral   Take 1 tablet (5 mg total) by mouth daily.   90 tablet   1   . ferrous sulfate 325 (65 FE) MG tablet   Oral   Take 325 mg by mouth daily with breakfast.         . levothyroxine (SYNTHROID, LEVOTHROID) 75 MCG tablet      take 1 tablet by mouth daily   30 tablet   2     DX: 244.9, must be seen   . Multiple Vitamin (MULTIVITAMIN WITH MINERALS) TABS tablet   Oral   Take 1 tablet by mouth daily.         . ondansetron (ZOFRAN-ODT) 8 MG disintegrating tablet   Oral   Take 1 tablet (8 mg total) by mouth every 8 (eight) hours as needed for nausea or vomiting. Patient not taking: Reported on 03/06/2015   30 tablet   1  Allergies Lamisil and Sulfur  Family History  Problem Relation Age of Onset  . Lymphoma Mother   . Alzheimer's disease Mother   . Alzheimer's disease Father   . Breast cancer Sister   . Depression Daughter   . Asthma Daughter   . Schizophrenia Daughter   . Thyroid disease Daughter     Social History Social History  Substance Use Topics  . Smoking status: Never Smoker   . Smokeless tobacco: Never Used  . Alcohol Use: No    Review of Systems Constitutional: No fever/chills Eyes: No visual changes. ENT: No sore throat. Cardiovascular: Denies chest pain. Respiratory: Denies shortness of  breath. Gastrointestinal: + abdominal pain.  No nausea, no vomiting.  No diarrhea.  No constipation. Genitourinary: Negative for dysuria. Musculoskeletal: Negative for back pain. Skin: Negative for rash. Neurological: Negative for headaches, focal weakness or numbness.  10-point ROS otherwise negative.  ____________________________________________   PHYSICAL EXAM:  VITAL SIGNS: ED Triage Vitals  Enc Vitals Group     BP 03/06/15 1542 136/54 mmHg     Pulse Rate 03/06/15 1542 63     Resp 03/06/15 1542 16     Temp 03/06/15 1542 97.7 F (36.5 C)     Temp Source 03/06/15 1542 Oral     SpO2 03/06/15 1542 100 %     Weight --      Height --      Head Cir --      Peak Flow --      Pain Score --      Pain Loc --      Pain Edu? --      Excl. in [place]? --     Constitutional: Alert and oriented. Well appearing and in no acute distress. Eyes: Conjunctivae are normal. PERRL. EOMI. Head: Atraumatic. Nose: No congestion/rhinnorhea. Mouth/Throat: Mucous membranes are moist.  Oropharynx non-erythematous. Neck: No stridor.  Cardiovascular: Normal rate, regular rhythm. Grossly normal heart sounds.  Good peripheral circulation. Respiratory: Normal respiratory effort.  No retractions. Lungs CTAB. Gastrointestinal: Soft with mild to moderate tenderness to palpation throughout the right abdomen. No CVA tenderness. Genitourinary: deferred Musculoskeletal: No lower extremity tenderness nor edema.  No joint effusions. Neurologic:  Normal speech and language. No gross focal neurologic deficits are appreciated. No gait instability. Skin:  Skin is warm, dry and intact. No rash noted. Psychiatric: Mood and affect are normal. Speech and behavior are normal.  ____________________________________________   LABS (all labs ordered are listed, but only abnormal results are displayed)  Labs Reviewed  COMPREHENSIVE METABOLIC PANEL - Abnormal; Notable for the following:    Chloride 99 (*)    Glucose, Bld  103 (*)    BUN 28 (*)    Creatinine, Ser 1.02 (*)    Albumin 3.4 (*)    GFR calc non Af Amer 49 (*)    GFR calc Af Amer 57 (*)    All other components within normal limits  CBC - Abnormal; Notable for the following:    Hemoglobin 9.8 (*)    HCT 32.2 (*)    MCV 64.2 (*)    MCH 19.5 (*)    MCHC 30.5 (*)    RDW 19.9 (*)    Platelets 605 (*)    All other components within normal limits  URINALYSIS COMPLETEWITH MICROSCOPIC ([facility] ONLY) - Abnormal; Notable for the following:    Color, Urine YELLOW (*)    APPearance HAZY (*)    Leukocytes, UA 2+ (*)    Bacteria, UA  MANY (*)    Squamous Epithelial / LPF 0-5 (*)    All other components within normal limits  URINE CULTURE  LIPASE, BLOOD   ____________________________________________  EKG  None ______________  RADIOLOGY  CT abdomen and pelvis IMPRESSION: 1. Long segment intussusception involving the cecum, ascending colon and transverse colon to the level of the mid transverse colon. Despite this finding, there does not appear to be overt bowel obstruction at this time. Additionally, no signs of frank perforation are noted at this time. Surgical consultation is strongly recommended. 2. Moderate to severe right and mild left hydronephrosis. No ureteral dilatation. These findings [place] suggest bilaterally UPJ obstruction. 3. 4 mm nonobstructive calculus in the lower pole collecting system of the right kidney. No ureteral stones. 4. Colonic diverticulosis 5. Extensive atherosclerosis, including left main and 2 vessel coronary artery disease. 6. Additional incidental findings, as above.  ____________________________________________   PROCEDURES  Procedure(s) performed: None  Critical Care performed: No  ____________________________________________   INITIAL IMPRESSION / ASSESSMENT AND PLAN / ED COURSE  Pertinent labs & imaging results that were available during my care of the patient were reviewed by me and  considered in my medical decision making (see chart for details).  Tammy Henry is a 81 y.o. female history dementia, hypertension, cachexia is for evaluation of naproxen one week of right-sided abdominal pain as well as right lower quadrant pain. On exam, she is nontoxic appearing and in no acute distress. Vital signs stable, she is afebrile. She does have moderate tenderness to palpation throughout the right abdomen as well as the right lower quadrant. Labs are reviewed. CMP is notable for very mild creatinine elevation at 1.02, CBC with chronic anemia, hemoglobin 9.8. Normal lipase. Urinalysis is concerning for urinary tract infection which [place] well be the source of her pain today however given her advanced age and tenderness, we'll obtain CT of the abdomen and pelvis to evaluate for mass, obstruction, obstructing nephrolithiasis, acute appendicitis. We'll treat her pain. Reassess for disposition.  ----------------------------------------- 10:18 PM on 03/06/2015 -----------------------------------------  CT scan concerning for intussusception. Case discussed with Dr. Burt Knack of Gen. surgery who will evaluate and admit. IV Zosyn ordered. ____________________________________________   FINAL CLINICAL IMPRESSION(S) / ED DIAGNOSES  Final diagnoses:  Lower abdominal pain  Intussusception ([place])      Joanne Gavel, MD 03/06/15 2219

## 2015-03-06 NOTE — ED Notes (Signed)
Pt finished with contrast, msg left CT

## 2015-03-06 NOTE — ED Notes (Signed)
Pt's daughter home, cell [phone], call with room assignment

## 2015-03-06 NOTE — ED Notes (Signed)
Pt recently had ultrasound of kidneys done and it was found to be abnormal.  Daughter concerned about her mother's nails changing colors.  Daughter also reports that patient has been c/o of abdominal pain.  Patient denies pain at this time.

## 2015-03-06 NOTE — ED Notes (Signed)
Pt refusing NG tube, Dr Lavetta Nielsen notified

## 2015-03-07 ENCOUNTER — Inpatient Hospital Stay: Payer: Medicare Other

## 2015-03-07 LAB — CBC
HCT: 27.6 % — ABNORMAL LOW (ref 35.0–47.0)
Hemoglobin: 8.6 g/dL — ABNORMAL LOW (ref 12.0–16.0)
MCH: 20.3 pg — AB (ref 26.0–34.0)
MCHC: 31 g/dL — ABNORMAL LOW (ref 32.0–36.0)
MCV: 65.5 fL — ABNORMAL LOW (ref 80.0–100.0)
PLATELETS: 454 10*3/uL — AB (ref 150–440)
RBC: 4.22 MIL/uL (ref 3.80–5.20)
RDW: 20.2 % — ABNORMAL HIGH (ref 11.5–14.5)
WBC: 7.5 10*3/uL (ref 3.6–11.0)

## 2015-03-07 LAB — BASIC METABOLIC PANEL
ANION GAP: 4 — AB (ref 5–15)
BUN: 21 mg/dL — ABNORMAL HIGH (ref 6–20)
CALCIUM: 8.2 mg/dL — AB (ref 8.9–10.3)
CO2: 29 mmol/L (ref 22–32)
CREATININE: 1.11 mg/dL — AB (ref 0.44–1.00)
Chloride: 104 mmol/L (ref 101–111)
GFR, EST AFRICAN AMERICAN: 52 mL/min — AB (ref >60)
GFR, EST NON AFRICAN AMERICAN: 45 mL/min — AB (ref >60)
Glucose, Bld: 119 mg/dL — ABNORMAL HIGH (ref 65–99)
Potassium: 4.1 mmol/L (ref 3.5–5.1)
SODIUM: 137 mmol/L (ref 135–145)

## 2015-03-07 MED ORDER — CETYLPYRIDINIUM CHLORIDE 0.05 % MT LIQD
7.0000 mL | Freq: Two times a day (BID) | OROMUCOSAL | Status: DC
  Administered 2015-03-07 – 2015-03-09 (×5): 7 mL via OROMUCOSAL

## 2015-03-07 MED ORDER — CHLORHEXIDINE GLUCONATE 0.12 % MT SOLN
15.0000 mL | Freq: Two times a day (BID) | OROMUCOSAL | Status: DC
  Administered 2015-03-07 – 2015-03-08 (×4): 15 mL via OROMUCOSAL
  Filled 2015-03-07 (×4): qty 15

## 2015-03-07 MED ORDER — INFLUENZA VAC SPLIT QUAD 0.5 ML IM SUSY
0.5000 mL | PREFILLED_SYRINGE | INTRAMUSCULAR | Status: AC
  Administered 2015-03-08: 0.5 mL via INTRAMUSCULAR

## 2015-03-07 MED ORDER — LACTULOSE ENEMA
300.0000 mL | Freq: Once | RECTAL | Status: AC
Start: 2015-03-07 — End: 2015-03-07
  Administered 2015-03-07: 300 mL via RECTAL
  Filled 2015-03-07: qty 300

## 2015-03-07 MED ORDER — PHENOL 1.4 % MT LIQD
1.0000 | OROMUCOSAL | Status: DC | PRN
  Administered 2015-03-07 (×2): 1 via OROMUCOSAL
  Filled 2015-03-07: qty 177

## 2015-03-07 NOTE — Progress Notes (Addendum)
Discussed w/ Dr. Azalee Course re: pt refusal for enema (RN discussed w/ daughter at bedside).  Also refuses foley and NGT.  MD to visit pt in a while re: POC

## 2015-03-07 NOTE — Progress Notes (Signed)
Pt updated that MD would be rounding soon.  Still declines enema at this time.

## 2015-03-07 NOTE — Consult Note (Signed)
[name] at [facility]                                                                                                                                                                                            Patient Demographics   Tammy Henry, is a 82 y.o. female, DOB - [birth year], NX:[id number]  Admit date - 03/06/2015   Admitting Physician Florene Glen, MD  Outpatient Primary MD for the patient is Loistine Chance, MD   LOS - 1  Subjective: Patient daughter at bedside she asked does not want to be in the hospital. To me she is denying any abdominal pain refused an NG tube, asking for water and food     Review of Systems:   CONSTITUTIONAL: No documented fever. No fatigue, weakness. No weight gain, no weight loss.  EYES: No blurry or double vision.  ENT: No tinnitus. No postnasal drip. No redness of the oropharynx.  RESPIRATORY: No cough, no wheeze, no hemoptysis. No dyspnea.  CARDIOVASCULAR: No chest pain. No orthopnea. No palpitations. No syncope.  GASTROINTESTINAL: No nausea, no vomiting or diarrhea. No abdominal pain. No melena or hematochezia.  GENITOURINARY: No dysuria or hematuria.  ENDOCRINE: No polyuria or nocturia. No heat or cold intolerance.  HEMATOLOGY: No anemia. No bruising. No bleeding.  INTEGUMENTARY: No rashes. No lesions.  MUSCULOSKELETAL: No arthritis. No swelling. No gout.  NEUROLOGIC: No numbness, tingling, or ataxia. No seizure-type activity.  PSYCHIATRIC: No anxiety. No insomnia. No ADD.    Vitals:   Filed Vitals:   03/06/15 2308 03/06/15 2347 03/07/15 0112 03/07/15 0451  BP: 140/52 145/53 151/50 144/62  Pulse: 55 50  68  Temp: 97.5 F (36.4 C) 97.5 F (36.4 C)  97.6 F (36.4 C)  TempSrc: Oral Oral  Oral  Resp: 16 20  20   Height:  5\' 4"  (1.626 m)    Weight:  38.873 kg (85 lb 11.2 oz)    SpO2: 99% 97%  99%    Wt Readings from Last 3 Encounters:  03/06/15 38.873 kg (85 lb 11.2 oz)  02/24/15 39.871  kg (87 lb 14.4 oz)  09/24/14 42.95 kg (94 lb 11 oz)     Intake/Output Summary (Last 24 hours) at 03/07/15 1232 Last data filed at 03/07/15 0949  Gross per 24 hour  Intake    911 ml  Output    200 ml  Net    711 ml    Physical Exam:   GENERAL: Pleasant-appearing in no apparent distress.  HEAD, EYES, EARS, NOSE AND THROAT: Atraumatic, normocephalic. Extraocular muscles are intact. Pupils equal and reactive to  light. Sclerae anicteric. No conjunctival injection. No oro-pharyngeal erythema.  NECK: Supple. There is no jugular venous distention. No bruits, no lymphadenopathy, no thyromegaly.  HEART: Regular rate and rhythm,. No murmurs, no rubs, no clicks.  LUNGS: Clear to auscultation bilaterally. No rales or rhonchi. No wheezes.  ABDOMEN: Soft, flat, nontender, nondistended. Has good bowel sounds. No hepatosplenomegaly appreciated.  EXTREMITIES: No evidence of any cyanosis, clubbing, or peripheral edema.  +2 pedal and radial pulses bilaterally.  NEUROLOGIC: The patient is alert, awake, and oriented x3 with no focal motor or sensory deficits appreciated bilaterally.  SKIN: Moist and warm with no rashes appreciated.  Psych: Not anxious, depressed LN: No inguinal LN enlargement    Antibiotics   Anti-infectives    Start     Dose/Rate Route Frequency Ordered Stop   03/07/15 0000  cefTRIAXone (ROCEPHIN) 1 g in dextrose 5 % 50 mL IVPB     1 g 100 mL/hr over 30 Minutes Intravenous Every 24 hours 03/06/15 2242     03/06/15 2200  piperacillin-tazobactam (ZOSYN) IVPB 3.375 g     3.375 g 12.5 mL/hr over 240 Minutes Intravenous  Once 03/06/15 2147 03/06/15 2250      Medications   Scheduled Meds: . amLODipine  5 mg Oral Daily  . antiseptic oral rinse  7 mL Mouth Rinse q12n4p  . cefTRIAXone (ROCEPHIN)  IV  1 g Intravenous Q24H  . chlorhexidine  15 mL Mouth Rinse BID  . heparin  5,000 Units Subcutaneous 3 times per day  . [START ON 03/08/2015] Influenza vac split quadrivalent PF  0.5 mL  Intramuscular Tomorrow-1000  . lactulose  300 mL Rectal Once  . levothyroxine  75 mcg Oral QAC breakfast  . ondansetron (ZOFRAN) IV  4 mg Intravenous Once   Continuous Infusions: . dextrose 5 % and 0.45 % NaCl with KCl 20 mEq/L 125 mL/hr at 03/07/15 1012   PRN Meds:.hydrALAZINE, morphine injection, ondansetron **OR** ondansetron (ZOFRAN) IV   Data Review:   Micro Results Recent Results (from the past 240 hour(s))  Urine culture     Status: None (Preliminary result)   Collection Time: 03/06/15  8:48 PM  Result Value Ref Range Status   Specimen Description URINE, CLEAN CATCH  Final   Special Requests NONE  Final   Culture   Final    >=100,000 COLONIES/mL ESCHERICHIA COLI SUSCEPTIBILITIES TO FOLLOW    Report Status PENDING  Incomplete    Radiology Reports US Abdomen Complete  03/03/2015  CLINICAL DATA:  Nausea.  Weight loss. EXAM: ABDOMEN ULTRASOUND COMPLETE COMPARISON:  CT 11/18/2011. FINDINGS: Gallbladder: No gallstones or wall thickening visualized. No sonographic Murphy sign noted by sonographer. Common bile duct: Diameter: 4.3 mm Liver: No focal lesion identified. Within normal limits in parenchymal echogenicity. IVC: No abnormality visualized. Pancreas: Visualized portion unremarkable. Spleen: Size and appearance within normal limits. Right Kidney: Length: 9.7 cm. Echogenicity within normal limits. No mass. Moderate hydronephrosis visualized. Left Kidney: Length: 10.0 cm. Echogenicity within normal limits. No mass. Mild hydronephrosis visualized. The bladder is nondistended. Abdominal aorta: No aneurysm visualized. Aortic atherosclerotic vascular disease. Other findings: None. IMPRESSION: 1. Moderate right hydronephrosis. Mild left hydronephrosis. Bladder is nondistended . 2.  Aortic atherosclerotic vascular disease. Electronically Signed   By: Marcello Moores  Register   On: 03/03/2015 11:24   Ct Abdomen Pelvis W Contrast  03/06/2015  CLINICAL DATA:  82 year old female this history of  abdominal pain for the past 2 days. Right-sided hydronephrosis noted on recent ultrasound examination. EXAM: CT ABDOMEN AND PELVIS  WITH CONTRAST TECHNIQUE: Multidetector CT imaging of the abdomen and pelvis was performed using the standard protocol following bolus administration of intravenous contrast. CONTRAST:  47mL OMNIPAQUE IOHEXOL 300 MG/ML  SOLN COMPARISON:  No priors.  Abdominal ultrasound 03/03/2015. FINDINGS: Lower chest: Mild scarring in the visualize lung bases. Mild cardiomegaly. Atherosclerotic calcifications in the left main, left anterior descending and right coronary arteries. Hepatobiliary: No suspicious cystic or solid hepatic lesions. No intra or extrahepatic biliary ductal dilatation. Gallbladder is normal in appearance. Pancreas: No pancreatic mass. No pancreatic ductal dilatation. No pancreatic or peripancreatic fluid or inflammatory changes. Spleen: Unremarkable. Adrenals/Urinary Tract: Moderate to severe right hydronephrosis and mild left hydronephrosis. Ureters do not appear to be dilated bilaterally. Urinary bladder is normal in appearance. No suspicious renal lesions are identified. 4 mm nonobstructive calculus in the lower pole collecting system of the right kidney. Sub cm low-attenuation lesion in the upper pole the right kidney is too small to definitively characterize, but is statistically likely a tiny cyst. Bilateral adrenal glands are normal in appearance. Stomach/Bowel: Stomach is normal in appearance. No pathologic dilatation of small bowel or colon. There is a long segment of intussusception involving the cecum, ascending colon and transverse colon to the level of the mid transverse colon. Some gas and stool is noted throughout the distal colon and rectum. Several scattered colonic diverticulae are noted, without surrounding inflammatory changes to suggest an acute diverticulitis at this time. Vascular/Lymphatic: Atherosclerosis throughout the abdominal and pelvic vasculature,  without evidence of aneurysm or dissection. No lymphadenopathy noted in the abdomen or pelvis. Reproductive: Uterus and ovaries are atrophic. Other: No significant volume of ascites.  No pneumoperitoneum. Musculoskeletal: There are no aggressive appearing lytic or blastic lesions noted in the visualized portions of the skeleton. Chronic appearing compression fracture of L3 with approximately 50% loss of anterior vertebral body height. IMPRESSION: 1. Long segment intussusception involving the cecum, ascending colon and transverse colon to the level of the mid transverse colon. Despite this finding, there does not appear to be overt bowel obstruction at this time. Additionally, no signs of frank perforation are noted at this time. Surgical consultation is strongly recommended. 2. Moderate to severe right and mild left hydronephrosis. No ureteral dilatation. These findings [place] suggest bilaterally UPJ obstruction. 3. 4 mm nonobstructive calculus in the lower pole collecting system of the right kidney. No ureteral stones. 4. Colonic diverticulosis 5. Extensive atherosclerosis, including left main and 2 vessel coronary artery disease. 6. Additional incidental findings, as above. These results were called by telephone at the time of interpretation on 03/06/2015 at 9:44 pm to Dr. Loura Pardon, who verbally acknowledged these results. Electronically Signed   By: Vinnie Langton M.D.   On: 03/06/2015 21:43   Dg Abd 2 Views  03/07/2015  CLINICAL DATA:  82 year old female with right abdominal pain. Proximal colon intussusception identified on recent CT. EXAM: ABDOMEN - 2 VIEW COMPARISON:  03/06/2015 CT FINDINGS: Evidence of proximal colon intussusception noted as visualized on recent CT as oral contrast remains. There is contrast within the descending colon noted. No dilated bowel loops are present. Contrast in the renal collecting systems identified with severe right hydronephrosis again noted. There is no evidence of  pneumoperitoneum. IMPRESSION: Proximal colon intussusception again identified, without evidence of bowel obstruction or pneumoperitoneum. Severe right hydronephrosis again noted. Electronically Signed   By: Margarette Canada M.D.   On: 03/07/2015 10:11     CBC  Recent Labs Lab 03/06/15 1547 03/07/15 0528  WBC 10.4 7.5  HGB 9.8* 8.6*  HCT 32.2* 27.6*  PLT 605* 454*  MCV 64.2* 65.5*  MCH 19.5* 20.3*  MCHC 30.5* 31.0*  RDW 19.9* 20.2*    Chemistries   Recent Labs Lab 03/06/15 1547 03/07/15 0528  NA 137 137  K 4.5 4.1  CL 99* 104  CO2 31 29  GLUCOSE 103* 119*  BUN 28* 21*  CREATININE 1.02* 1.11*  CALCIUM 9.4 8.2*  AST 15  --   ALT 14  --   ALKPHOS 93  --   BILITOT 1.0  --    ------------------------------------------------------------------------------------------------------------------ estimated creatinine clearance is 23.6 mL/min (by C-G formula based on Cr of 1.11). ------------------------------------------------------------------------------------------------------------------ No results for input(s): HGBA1C in the last 72 hours. ------------------------------------------------------------------------------------------------------------------ No results for input(s): CHOL, HDL, LDLCALC, TRIG, CHOLHDL, LDLDIRECT in the last 72 hours. ------------------------------------------------------------------------------------------------------------------ No results for input(s): TSH, T4TOTAL, T3FREE, THYROIDAB in the last 72 hours.  Invalid input(s): FREET3 ------------------------------------------------------------------------------------------------------------------ No results for input(s): VITAMINB12, FOLATE, FERRITIN, TIBC, IRON, RETICCTPCT in the last 72 hours.  Coagulation profile No results for input(s): INR, PROTIME in the last 168 hours.  No results for input(s): DDIMER in the last 72 hours.  Cardiac Enzymes No results for input(s): CKMB, TROPONINI, MYOGLOBIN in  the last 168 hours.  Invalid input(s): CK ------------------------------------------------------------------------------------------------------------------ Invalid input(s): POCBNP    Assessment & Plan   82 year old Caucasian female with history of dementia as well as essential hypertension who originally presented to the hospital 03/06/2015 for abdominal pain found to have intussusception   1. Urinary tract infection: Urine culture showing gram-negative rods continue Ceftriaxone for antibiotic coverage follow culture data 2. Essential hypertension: Continue Norvasc and as necessary hydralazine  3. Hydronephrosis: No evidence of acute kidney injury continue with IV fluid hydration and monitor urine output/renal function likely chronic in nature 4. Intussusception: Care per primary team       Code Status Orders        Start     Ordered   03/06/15 2235  Full code   Continuous     03/06/15 2237    Advance Directive Documentation        Most Recent Value   Type of Advance Directive  -- Lavina Hamman has financial POA-oldest daughter, Otila Kluver Kissel]   Pre-existing out of facility DNR order (yellow form or pink MOST form)     "MOST" Form in Place?                DVT Prophylaxis  SCDs  Lab Results  Component Value Date   PLT 454* 03/07/2015     Time Spent in minutes   35 minutes Dustin Flock M.D on 03/07/2015 at 12:32 PM  Between 7am to 6pm - Pager - [phone]  After 6pm go to [email or web address] - password [email or web address] [facility]   Office  [phone]

## 2015-03-07 NOTE — Progress Notes (Signed)
Continues to refuse foley and NGT.  Anxious when awake..."..I can't do this.Marland KitchenMarland KitchenMarland KitchenI'm not going to make it...this bed is too small.Marland KitchenMarland KitchenI want to go back downstairs.Marland KitchenMarland KitchenI can't see my daughter's face..."  Daughter at bedside. Barbaraann Faster, RN 4:15 AM 03/07/2015

## 2015-03-07 NOTE — Progress Notes (Signed)
82 yr old female frail with dementia and with large intussception that appears to have been occuring at least for the past week according to family.  Patient states some abdominal pain at this time but improving.  She does not necessarily understand what is going on and when gets an understanding, she then gets upset that "nothing has been done for her yet".  I have discussed with her the daughter at bedside and with the daughter that has POA that if we are not able to get an NG tube down and enemas started might not be able to avoid an operation.  Also discussed that an operation has severe risks for her including a high risk of death, especially given fraility and poor nutrition.  I would recommend if it comes to operative intervention that a palliative care consult would be helpful.  Will place an order for them to discuss end of life as well.  For now patient and daughters agree to NG tube and enemas.

## 2015-03-07 NOTE — Progress Notes (Addendum)
NGT placed, but prior to verification and full assessment of location, pt removed tube.  When RN tried to offer reassurance, pt grabbed RN's arm and squeezed skin with her fingernails.  MD notified.

## 2015-03-08 DIAGNOSIS — K561 Intussusception: Secondary | ICD-10-CM | POA: Diagnosis not present

## 2015-03-08 LAB — BASIC METABOLIC PANEL
ANION GAP: 5 (ref 5–15)
BUN: 11 mg/dL (ref 6–20)
CALCIUM: 8.3 mg/dL — AB (ref 8.9–10.3)
CO2: 27 mmol/L (ref 22–32)
Chloride: 107 mmol/L (ref 101–111)
Creatinine, Ser: 0.77 mg/dL (ref 0.44–1.00)
GLUCOSE: 99 mg/dL (ref 65–99)
POTASSIUM: 4.6 mmol/L (ref 3.5–5.1)
Sodium: 139 mmol/L (ref 135–145)

## 2015-03-08 LAB — CBC
HEMATOCRIT: 28.3 % — AB (ref 35.0–47.0)
Hemoglobin: 8.7 g/dL — ABNORMAL LOW (ref 12.0–16.0)
MCH: 19.9 pg — ABNORMAL LOW (ref 26.0–34.0)
MCHC: 30.7 g/dL — ABNORMAL LOW (ref 32.0–36.0)
MCV: 64.8 fL — AB (ref 80.0–100.0)
Platelets: 504 10*3/uL — ABNORMAL HIGH (ref 150–440)
RBC: 4.37 MIL/uL (ref 3.80–5.20)
RDW: 20.4 % — AB (ref 11.5–14.5)
WBC: 7.9 10*3/uL (ref 3.6–11.0)

## 2015-03-08 LAB — URINE CULTURE

## 2015-03-08 MED ORDER — CEFTRIAXONE SODIUM 1 G IJ SOLR: 1.0000 g | INTRAMUSCULAR | Status: DC

## 2015-03-08 MED ORDER — HYDROCODONE-ACETAMINOPHEN 5-325 MG PO TABS: 1.0000 | ORAL_TABLET | Freq: Four times a day (QID) | ORAL | Status: DC | PRN

## 2015-03-08 MED ORDER — ACETAMINOPHEN 325 MG PO TABS: 650.0000 mg | ORAL_TABLET | Freq: Four times a day (QID) | ORAL | Status: DC | PRN

## 2015-03-08 MED ORDER — SODIUM CHLORIDE 0.9 % IV SOLN
1.0000 g | Freq: Four times a day (QID) | INTRAVENOUS | Status: DC
  Administered 2015-03-09 (×3): 1 g via INTRAVENOUS
  Filled 2015-03-08 (×7): qty 1000

## 2015-03-08 MED ORDER — CIPROFLOXACIN IN D5W 400 MG/200ML IV SOLN
400.0000 mg | Freq: Two times a day (BID) | INTRAVENOUS | Status: DC
Start: 2015-03-08 — End: 2015-03-09
  Administered 2015-03-08 – 2015-03-09 (×2): 400 mg via INTRAVENOUS
  Filled 2015-03-08 (×3): qty 200

## 2015-03-08 MED ORDER — MORPHINE SULFATE (PF) 2 MG/ML IV SOLN: 1.0000 mg | INTRAVENOUS | Status: DC | PRN

## 2015-03-08 NOTE — Progress Notes (Signed)
CC: Intussusception Subjective: 82 year old pleasantly demented female without any acute events overnight. Patient denies any abdominal pain at all to me this morning. States she is hungry and questions when her daughter will be that this morning. She doesn't know if she is passed anything out of bottom or not.  Objective: Vital signs in last 24 hours: Temp:  [97.9 F (36.6 C)-98 F (36.7 C)] 97.9 F (36.6 C) (01/02 0555) Pulse Rate:  [65-72] 65 (01/02 0555) Resp:  [17-18] 17 (01/02 0555) BP: (134-167)/(49-57) 134/49 mmHg (01/02 0555) SpO2:  [97 %-100 %] 99 % (01/02 0555) Last BM Date:  (unknown, per pt's daughter)  Intake/Output from previous day: 01/01 0701 - 01/02 0700 In: 2744 [P.O.:30; I.V.:2564; NG/GT:150] Out: 2565 [Urine:2025; Emesis/NG output:340; Stool:200] Intake/Output this shift:    Physical exam:  Gen.: A acute distress Chest: Clear to auscultation Heart: Regular rate and rhythm Abdomen: Soft, nontender, nondistended  Lab Results: CBC   Recent Labs  03/07/15 0528 03/08/15 0510  WBC 7.5 7.9  HGB 8.6* 8.7*  HCT 27.6* 28.3*  PLT 454* 504*   BMET  Recent Labs  03/07/15 0528 03/08/15 0510  NA 137 139  K 4.1 4.6  CL 104 107  CO2 29 27  GLUCOSE 119* 99  BUN 21* 11  CREATININE 1.11* 0.77  CALCIUM 8.2* 8.3*   PT/INR No results for input(s): LABPROT, INR in the last 72 hours. ABG No results for input(s): PHART, HCO3 in the last 72 hours.  Invalid input(s): PCO2, PO2  Studies/Results: Ct Abdomen Pelvis W Contrast  03/06/2015  CLINICAL DATA:  82 year old female this history of abdominal pain for the past 2 days. Right-sided hydronephrosis noted on recent ultrasound examination. EXAM: CT ABDOMEN AND PELVIS WITH CONTRAST TECHNIQUE: Multidetector CT imaging of the abdomen and pelvis was performed using the standard protocol following bolus administration of intravenous contrast. CONTRAST:  42mL OMNIPAQUE IOHEXOL 300 MG/ML  SOLN COMPARISON:  No priors.   Abdominal ultrasound 03/03/2015. FINDINGS: Lower chest: Mild scarring in the visualize lung bases. Mild cardiomegaly. Atherosclerotic calcifications in the left main, left anterior descending and right coronary arteries. Hepatobiliary: No suspicious cystic or solid hepatic lesions. No intra or extrahepatic biliary ductal dilatation. Gallbladder is normal in appearance. Pancreas: No pancreatic mass. No pancreatic ductal dilatation. No pancreatic or peripancreatic fluid or inflammatory changes. Spleen: Unremarkable. Adrenals/Urinary Tract: Moderate to severe right hydronephrosis and mild left hydronephrosis. Ureters do not appear to be dilated bilaterally. Urinary bladder is normal in appearance. No suspicious renal lesions are identified. 4 mm nonobstructive calculus in the lower pole collecting system of the right kidney. Sub cm low-attenuation lesion in the upper pole the right kidney is too small to definitively characterize, but is statistically likely a tiny cyst. Bilateral adrenal glands are normal in appearance. Stomach/Bowel: Stomach is normal in appearance. No pathologic dilatation of small bowel or colon. There is a long segment of intussusception involving the cecum, ascending colon and transverse colon to the level of the mid transverse colon. Some gas and stool is noted throughout the distal colon and rectum. Several scattered colonic diverticulae are noted, without surrounding inflammatory changes to suggest an acute diverticulitis at this time. Vascular/Lymphatic: Atherosclerosis throughout the abdominal and pelvic vasculature, without evidence of aneurysm or dissection. No lymphadenopathy noted in the abdomen or pelvis. Reproductive: Uterus and ovaries are atrophic. Other: No significant volume of ascites.  No pneumoperitoneum. Musculoskeletal: There are no aggressive appearing lytic or blastic lesions noted in the visualized portions of the skeleton. Chronic appearing  compression fracture of L3 with  approximately 50% loss of anterior vertebral body height. IMPRESSION: 1. Long segment intussusception involving the cecum, ascending colon and transverse colon to the level of the mid transverse colon. Despite this finding, there does not appear to be overt bowel obstruction at this time. Additionally, no signs of frank perforation are noted at this time. Surgical consultation is strongly recommended. 2. Moderate to severe right and mild left hydronephrosis. No ureteral dilatation. These findings [place] suggest bilaterally UPJ obstruction. 3. 4 mm nonobstructive calculus in the lower pole collecting system of the right kidney. No ureteral stones. 4. Colonic diverticulosis 5. Extensive atherosclerosis, including left main and 2 vessel coronary artery disease. 6. Additional incidental findings, as above. These results were called by telephone at the time of interpretation on 03/06/2015 at 9:44 pm to Dr. Loura Pardon, who verbally acknowledged these results. Electronically Signed   By: Vinnie Langton M.D.   On: 03/06/2015 21:43   Dg Abd 2 Views  03/07/2015  CLINICAL DATA:  82 year old female with right abdominal pain. Proximal colon intussusception identified on recent CT. EXAM: ABDOMEN - 2 VIEW COMPARISON:  03/06/2015 CT FINDINGS: Evidence of proximal colon intussusception noted as visualized on recent CT as oral contrast remains. There is contrast within the descending colon noted. No dilated bowel loops are present. Contrast in the renal collecting systems identified with severe right hydronephrosis again noted. There is no evidence of pneumoperitoneum. IMPRESSION: Proximal colon intussusception again identified, without evidence of bowel obstruction or pneumoperitoneum. Severe right hydronephrosis again noted. Electronically Signed   By: Margarette Canada M.D.   On: 03/07/2015 10:11    Anti-infectives: Anti-infectives    Start     Dose/Rate Route Frequency Ordered Stop   03/07/15 0000  cefTRIAXone (ROCEPHIN) 1 g in  dextrose 5 % 50 mL IVPB     1 g 100 mL/hr over 30 Minutes Intravenous Every 24 hours 03/06/15 2242     03/06/15 2200  piperacillin-tazobactam (ZOSYN) IVPB 3.375 g     3.375 g 12.5 mL/hr over 240 Minutes Intravenous  Once 03/06/15 2147 03/06/15 2250      Assessment/Plan:  82 year old female admitted for a chronic intussusception. No evidence of any symptomatology or bowel obstruction from intussusception on exam today. This creates a very complicated decision as her intussusception will likely require surgery to fully resolve however she is possibly asymptomatic from this. Currently we'll treat clinically with no indication for operative intervention at this time. Will discuss with family when they arrive later today.  Connelly Spruell T. Adonis Huguenin, MD, FACS  03/08/2015

## 2015-03-08 NOTE — Progress Notes (Signed)
Initial Nutrition Assessment  DOCUMENTATION CODES:   Severe malnutrition in context of chronic illness  INTERVENTION:  Meals and snacks: Await diet progression Medical Nutrition Supplement Therapy: recommend ensure enlive BID for added nutrition once diet progressed   NUTRITION DIAGNOSIS:   Inadequate oral intake related to altered GI function as evidenced by NPO status.    GOAL:   Patient will meet greater than or equal to 90% of their needs    MONITOR:    (Energy intake, Digestive system)  REASON FOR ASSESSMENT:   Malnutrition Screening Tool    ASSESSMENT:      Pt admitted with intusseception, UTI.  Planning Palliative care consult  Past Medical History  Diagnosis Date  . Other frontotemporal dementia   . Acute kidney failure, unspecified ([place])   . Osteoporosis, unspecified   . Essential hypertension, benign   . Depressive disorder, not elsewhere classified   . Edema   . Other psoriasis   . Unspecified venous (peripheral) insufficiency   . Anxiety state, unspecified   . Unspecified hypothyroidism   . Functional diarrhea   . Unspecified pruritic disorder   . Other specified disorders of shoulder joint   . Cachexia ([place])   . Dehydration   . Urinary tract infection, site not specified   . Other and unspecified disc disorder of cervical region   . Early satiety   . Iron deficiency anemia, unspecified   . Nonspecific abnormal electrocardiogram (ECG) (EKG)     Current Nutrition: NPO  Food/Nutrition-Related History: Dtr reports poor po intake for 2-3 days prior to admission   Scheduled Medications:  . amLODipine  5 mg Oral Daily  . antiseptic oral rinse  7 mL Mouth Rinse q12n4p  . cefTRIAXone (ROCEPHIN)  IV  1 g Intravenous Q24H  . chlorhexidine  15 mL Mouth Rinse BID  . heparin  5,000 Units Subcutaneous 3 times per day  . Influenza vac split quadrivalent PF  0.5 mL Intramuscular Tomorrow-1000  . levothyroxine  75 mcg Oral QAC breakfast  .  ondansetron (ZOFRAN) IV  4 mg Intravenous Once    Continuous Medications:  . dextrose 5 % and 0.45 % NaCl with KCl 20 mEq/L 125 mL/hr at 03/08/15 0259     Electrolyte/Renal Profile and Glucose Profile:   Recent Labs Lab 03/06/15 1547 03/07/15 0528 03/08/15 0510  NA 137 137 139  K 4.5 4.1 4.6  CL 99* 104 107  CO2 31 29 27   BUN 28* 21* 11  CREATININE 1.02* 1.11* 0.77  CALCIUM 9.4 8.2* 8.3*  GLUCOSE 103* 119* 99   Protein Profile:  Recent Labs Lab 03/06/15 1547  ALBUMIN 3.4*    Gastrointestinal Profile: NG tube in place at this time 380ml output noted Last BM: 1/1, s/p enema   Nutrition-Focused Physical Exam Findings: Nutrition-Focused physical exam completed. Findings are severe fat depletion, moderate to severe muscle depletion, and no edema.      Weight Change: 15% wt loss in the last 7 months per wt encounters    Diet Order:  Diet NPO time specified Except for: Sips with Meds  Skin:   reviewed   Height:   Ht Readings from Last 1 Encounters:  03/06/15 5\' 4"  (1.626 m)    Weight:   Wt Readings from Last 1 Encounters:  03/06/15 85 lb 11.2 oz (38.873 kg)    Ideal Body Weight:     BMI:  Body mass index is 14.7 kg/(m^2).  Estimated Nutritional Needs:   Kcal:  BEE 990 kcals (IF  1.0-1.3, AF 1.3) [id number] kcals/d. Using IBW of 55kg  Protein:  (1.0-1.2 g/kg) 55-66 g/d  Fluid:  (25-27ml/kg) 1375-1660ml/d  EDUCATION NEEDS:   No education needs identified at this time  HIGH Care Level  Lacrecia Delval B. Zenia Resides, [place], [place] (pager) Weekend/On-Call pager [phone])

## 2015-03-08 NOTE — Progress Notes (Signed)
Per Dr. Adonis Huguenin remove NG tube.

## 2015-03-08 NOTE — Progress Notes (Signed)
Patient has done well throughout the day. NG tube removed without any return of nausea, abdominal pain, distention. Patient has had some flatus and a small bowel movement throughout the day. We'll start on trial of clear liquids to diet. If patient remains asymptomatic we'll then advanced diet tomorrow.  Clayburn Pert, MD FACS General Surgeon [facility]

## 2015-03-08 NOTE — Plan of Care (Signed)
Problem: Education: Goal: Knowledge of [facility] Education information/materials will improve Outcome: Not Progressing Pt is confused     Problem: Activity: Goal: Risk for activity intolerance will decrease Outcome: Not Progressing Need assistance to [facility]     Problem: Nutrition: Goal: Adequate nutrition will be maintained Outcome: Not Progressing NPO d/t NGT  Problem: Bowel/Gastric: Goal: Will not experience complications related to bowel motility Outcome: Not Progressing NGT in place

## 2015-03-08 NOTE — Consult Note (Signed)
[facility]                                                                                                                                                                                            Patient Demographics   Tammy Henry, is a 82 y.o. female, DOB - 12/17/1929, [id number]  Admit date - 03/06/2015   Admitting Physician Florene Glen, MD  Outpatient Primary MD for the patient is Loistine Chance, MD   LOS - 2  Subjective: Wants to eat denies any complaints   Review of Systems:   CONSTITUTIONAL: No documented fever. No fatigue, weakness. No weight gain, no weight loss.  EYES: No blurry or double vision.  ENT: No tinnitus. No postnasal drip. No redness of the oropharynx.  RESPIRATORY: No cough, no wheeze, no hemoptysis. No dyspnea.  CARDIOVASCULAR: No chest pain. No orthopnea. No palpitations. No syncope.  GASTROINTESTINAL: No nausea, no vomiting or diarrhea. No abdominal pain. No melena or hematochezia.  GENITOURINARY: No dysuria or hematuria.  ENDOCRINE: No polyuria or nocturia. No heat or cold intolerance.  HEMATOLOGY: No anemia. No bruising. No bleeding.  INTEGUMENTARY: No rashes. No lesions.  MUSCULOSKELETAL: No arthritis. No swelling. No gout.  NEUROLOGIC: No numbness, tingling, or ataxia. No seizure-type activity.  PSYCHIATRIC: No anxiety. No insomnia. No ADD.    Vitals:   Filed Vitals:   03/07/15 1247 03/07/15 2025 03/08/15 0555 03/08/15 1327  BP: 166/57 167/50 134/49 142/53  Pulse: 71 72 65 74  Temp: 98 F (36.7 C) 97.9 F (36.6 C) 97.9 F (36.6 C) 97.7 F (36.5 C)  TempSrc: Oral Oral Oral Oral  Resp:  18 17 17   Height:      Weight:      SpO2: 100% 97% 99% 100%    Wt Readings from Last 3 Encounters:  03/06/15 38.873 kg (85 lb 11.2 oz)  02/24/15 39.871 kg (87 lb 14.4 oz)  09/24/14 42.95 kg (94 lb 11 oz)     Intake/Output Summary (Last 24 hours) at 03/08/15 1412 Last data filed at 03/08/15  1215  Gross per 24 hour  Intake   2681 ml  Output   2565 ml  Net    116 ml    Physical Exam:   GENERAL: Pleasant-appearing in no apparent distress.  HEAD, EYES, EARS, NOSE AND THROAT: Atraumatic, normocephalic. Extraocular muscles are intact. Pupils equal and reactive to light. Sclerae anicteric. No conjunctival injection. No oro-pharyngeal erythema.  NECK: Supple. There is no jugular venous distention. No bruits, no lymphadenopathy, no thyromegaly.  HEART: Regular rate and rhythm,. No murmurs, no rubs,  no clicks.  LUNGS: Clear to auscultation bilaterally. No rales or rhonchi. No wheezes.  ABDOMEN: Soft, flat, nontender, nondistended. Diminished bowel site. No hepatosplenomegaly appreciated.  EXTREMITIES: No evidence of any cyanosis, clubbing, or peripheral edema.  +2 pedal and radial pulses bilaterally.  NEUROLOGIC: The patient is alert, awake, and oriented x3 with no focal motor or sensory deficits appreciated bilaterally.  SKIN: Moist and warm with no rashes appreciated.  Psych: Not anxious, depressed LN: No inguinal LN enlargement    Antibiotics   Anti-infectives    Start     Dose/Rate Route Frequency Ordered Stop   03/09/15 1000  cefTRIAXone (ROCEPHIN) 1 g in dextrose 5 % 50 mL IVPB  Status:  Discontinued     1 g 100 mL/hr over 30 Minutes Intravenous Every 24 hours 03/08/15 1206 03/08/15 1347   03/09/15 0000  ampicillin (OMNIPEN) 1 g in sodium chloride 0.9 % 50 mL IVPB     1 g 150 mL/hr over 20 Minutes Intravenous 4 times per day 03/08/15 1347     03/07/15 0000  cefTRIAXone (ROCEPHIN) 1 g in dextrose 5 % 50 mL IVPB  Status:  Discontinued     1 g 100 mL/hr over 30 Minutes Intravenous Every 24 hours 03/06/15 2242 03/08/15 1206   03/06/15 2200  piperacillin-tazobactam (ZOSYN) IVPB 3.375 g     3.375 g 12.5 mL/hr over 240 Minutes Intravenous  Once 03/06/15 2147 03/06/15 2250      Medications   Scheduled Meds: . amLODipine  5 mg Oral Daily  . [START ON 03/09/2015] ampicillin  (OMNIPEN) IV  1 g Intravenous 4 times per day  . antiseptic oral rinse  7 mL Mouth Rinse q12n4p  . chlorhexidine  15 mL Mouth Rinse BID  . heparin  5,000 Units Subcutaneous 3 times per day  . levothyroxine  75 mcg Oral QAC breakfast  . ondansetron (ZOFRAN) IV  4 mg Intravenous Once   Continuous Infusions: . dextrose 5 % and 0.45 % NaCl with KCl 20 mEq/L 125 mL/hr at 03/08/15 1109   PRN Meds:.acetaminophen, hydrALAZINE, HYDROcodone-acetaminophen, morphine injection, ondansetron **OR** ondansetron (ZOFRAN) IV, phenol   Data Review:   Micro Results Recent Results (from the past 240 hour(s))  Urine culture     Status: None   Collection Time: 03/06/15  8:48 PM  Result Value Ref Range Status   Specimen Description URINE, CLEAN CATCH  Final   Special Requests NONE  Final   Culture >=100,000 COLONIES/mL ESCHERICHIA COLI  Final   Report Status 03/08/2015 FINAL  Final   Organism ID, Bacteria ESCHERICHIA COLI  Final      Susceptibility   Escherichia coli - MIC*    AMPICILLIN 4 SENSITIVE Sensitive     CEFAZOLIN <=4 SENSITIVE Sensitive     CEFTRIAXONE <=1 SENSITIVE Sensitive     CIPROFLOXACIN <=0.25 SENSITIVE Sensitive     GENTAMICIN <=1 SENSITIVE Sensitive     IMIPENEM <=0.25 SENSITIVE Sensitive     NITROFURANTOIN 32 SENSITIVE Sensitive     TRIMETH/SULFA >=320 RESISTANT Resistant     Extended ESBL NEGATIVE Sensitive     PIP/TAZO Value in next row Sensitive      SENSITIVE<=4    ERTAPENEM Value in next row Sensitive      SENSITIVE<=0.5    LEVOFLOXACIN Value in next row Sensitive      SENSITIVE<=0.12    * >=100,000 COLONIES/mL ESCHERICHIA COLI    Radiology Reports US Abdomen Complete  03/03/2015  CLINICAL DATA:  Nausea.  Weight loss. EXAM:  ABDOMEN ULTRASOUND COMPLETE COMPARISON:  CT 11/18/2011. FINDINGS: Gallbladder: No gallstones or wall thickening visualized. No sonographic Murphy sign noted by sonographer. Common bile duct: Diameter: 4.3 mm Liver: No focal lesion identified.  Within normal limits in parenchymal echogenicity. IVC: No abnormality visualized. Pancreas: Visualized portion unremarkable. Spleen: Size and appearance within normal limits. Right Kidney: Length: 9.7 cm. Echogenicity within normal limits. No mass. Moderate hydronephrosis visualized. Left Kidney: Length: 10.0 cm. Echogenicity within normal limits. No mass. Mild hydronephrosis visualized. The bladder is nondistended. Abdominal aorta: No aneurysm visualized. Aortic atherosclerotic vascular disease. Other findings: None. IMPRESSION: 1. Moderate right hydronephrosis. Mild left hydronephrosis. Bladder is nondistended . 2.  Aortic atherosclerotic vascular disease. Electronically Signed   By: Marcello Moores  Register   On: 03/03/2015 11:24   Ct Abdomen Pelvis W Contrast  03/06/2015  CLINICAL DATA:  81 year old female this history of abdominal pain for the past 2 days. Right-sided hydronephrosis noted on recent ultrasound examination. EXAM: CT ABDOMEN AND PELVIS WITH CONTRAST TECHNIQUE: Multidetector CT imaging of the abdomen and pelvis was performed using the standard protocol following bolus administration of intravenous contrast. CONTRAST:  34mL OMNIPAQUE IOHEXOL 300 MG/ML  SOLN COMPARISON:  No priors.  Abdominal ultrasound 03/03/2015. FINDINGS: Lower chest: Mild scarring in the visualize lung bases. Mild cardiomegaly. Atherosclerotic calcifications in the left main, left anterior descending and right coronary arteries. Hepatobiliary: No suspicious cystic or solid hepatic lesions. No intra or extrahepatic biliary ductal dilatation. Gallbladder is normal in appearance. Pancreas: No pancreatic mass. No pancreatic ductal dilatation. No pancreatic or peripancreatic fluid or inflammatory changes. Spleen: Unremarkable. Adrenals/Urinary Tract: Moderate to severe right hydronephrosis and mild left hydronephrosis. Ureters do not appear to be dilated bilaterally. Urinary bladder is normal in appearance. No suspicious renal lesions are  identified. 4 mm nonobstructive calculus in the lower pole collecting system of the right kidney. Sub cm low-attenuation lesion in the upper pole the right kidney is too small to definitively characterize, but is statistically likely a tiny cyst. Bilateral adrenal glands are normal in appearance. Stomach/Bowel: Stomach is normal in appearance. No pathologic dilatation of small bowel or colon. There is a long segment of intussusception involving the cecum, ascending colon and transverse colon to the level of the mid transverse colon. Some gas and stool is noted throughout the distal colon and rectum. Several scattered colonic diverticulae are noted, without surrounding inflammatory changes to suggest an acute diverticulitis at this time. Vascular/Lymphatic: Atherosclerosis throughout the abdominal and pelvic vasculature, without evidence of aneurysm or dissection. No lymphadenopathy noted in the abdomen or pelvis. Reproductive: Uterus and ovaries are atrophic. Other: No significant volume of ascites.  No pneumoperitoneum. Musculoskeletal: There are no aggressive appearing lytic or blastic lesions noted in the visualized portions of the skeleton. Chronic appearing compression fracture of L3 with approximately 50% loss of anterior vertebral body height. IMPRESSION: 1. Long segment intussusception involving the cecum, ascending colon and transverse colon to the level of the mid transverse colon. Despite this finding, there does not appear to be overt bowel obstruction at this time. Additionally, no signs of frank perforation are noted at this time. Surgical consultation is strongly recommended. 2. Moderate to severe right and mild left hydronephrosis. No ureteral dilatation. These findings [place] suggest bilaterally UPJ obstruction. 3. 4 mm nonobstructive calculus in the lower pole collecting system of the right kidney. No ureteral stones. 4. Colonic diverticulosis 5. Extensive atherosclerosis, including left main and 2  vessel coronary artery disease. 6. Additional incidental findings, as above. These results were called by  telephone at the time of interpretation on 03/06/2015 at 9:44 pm to Dr. Loura Pardon, who verbally acknowledged these results. Electronically Signed   By: Vinnie Langton M.D.   On: 03/06/2015 21:43   Dg Abd 2 Views  03/07/2015  CLINICAL DATA:  81 year old female with right abdominal pain. Proximal colon intussusception identified on recent CT. EXAM: ABDOMEN - 2 VIEW COMPARISON:  03/06/2015 CT FINDINGS: Evidence of proximal colon intussusception noted as visualized on recent CT as oral contrast remains. There is contrast within the descending colon noted. No dilated bowel loops are present. Contrast in the renal collecting systems identified with severe right hydronephrosis again noted. There is no evidence of pneumoperitoneum. IMPRESSION: Proximal colon intussusception again identified, without evidence of bowel obstruction or pneumoperitoneum. Severe right hydronephrosis again noted. Electronically Signed   By: Margarette Canada M.D.   On: 03/07/2015 10:11     CBC  Recent Labs Lab 03/06/15 1547 03/07/15 0528 03/08/15 0510  WBC 10.4 7.5 7.9  HGB 9.8* 8.6* 8.7*  HCT 32.2* 27.6* 28.3*  PLT 605* 454* 504*  MCV 64.2* 65.5* 64.8*  MCH 19.5* 20.3* 19.9*  MCHC 30.5* 31.0* 30.7*  RDW 19.9* 20.2* 20.4*    Chemistries   Recent Labs Lab 03/06/15 1547 03/07/15 0528 03/08/15 0510  NA 137 137 139  K 4.5 4.1 4.6  CL 99* 104 107  CO2 31 29 27   GLUCOSE 103* 119* 99  BUN 28* 21* 11  CREATININE 1.02* 1.11* 0.77  CALCIUM 9.4 8.2* 8.3*  AST 15  --   --   ALT 14  --   --   ALKPHOS 93  --   --   BILITOT 1.0  --   --    ------------------------------------------------------------------------------------------------------------------ estimated creatinine clearance is 32.7 mL/min (by C-G formula based on Cr of  0.77). ------------------------------------------------------------------------------------------------------------------ No results for input(s): HGBA1C in the last 72 hours. ------------------------------------------------------------------------------------------------------------------ No results for input(s): CHOL, HDL, LDLCALC, TRIG, CHOLHDL, LDLDIRECT in the last 72 hours. ------------------------------------------------------------------------------------------------------------------ No results for input(s): TSH, T4TOTAL, T3FREE, THYROIDAB in the last 72 hours.  Invalid input(s): FREET3 ------------------------------------------------------------------------------------------------------------------ No results for input(s): VITAMINB12, FOLATE, FERRITIN, TIBC, IRON, RETICCTPCT in the last 72 hours.  Coagulation profile No results for input(s): INR, PROTIME in the last 168 hours.  No results for input(s): DDIMER in the last 72 hours.  Cardiac Enzymes No results for input(s): CKMB, TROPONINI, MYOGLOBIN in the last 168 hours.  Invalid input(s): CK ------------------------------------------------------------------------------------------------------------------ Invalid input(s): POCBNP    Assessment & Plan   82 year old Caucasian female with history of dementia as well as essential hypertension who originally presented to the hospital 03/06/2015 for abdominal pain found to have intussusception   1. Urinary tract infection: Pansensitive Escherichia coli will change to ampicillin  2. Essential hypertension: Continue Norvasc and as necessary hydralazine  3. Hydronephrosis: No evidence of acute kidney injury continue with IV fluid hydration and monitor urine output/renal function likely chronic in nature 4. Intussusception: Care per primary team       Code Status Orders        Start     Ordered   03/06/15 2235  Full code   Continuous     03/06/15 2237    Advance  Directive Documentation        Most Recent Value   Type of Advance Directive  -- Lavina Hamman has financial POA-oldest daughter, Otila Kluver Kissel]   Pre-existing out of facility DNR order (yellow form or pink MOST form)     "MOST" Form in Place?  DVT Prophylaxis  SCDs  Lab Results  Component Value Date   PLT 504* 03/08/2015     Time Spent in minutes   32 minutes Dustin Flock M.D on 03/08/2015 at 2:12 PM  Between 7am to 6pm - Pager - [phone]  After 6pm go to [email or web address] - password [email or web address]   Office  [phone]

## 2015-03-08 NOTE — Care Management Important Message (Signed)
Important Message  Patient Details  Name: Tammy Henry MRN: [id number] Date of Birth: November 06, 1929   Medicare Important Message Given:  Yes    Juliann Pulse A Sher Hellinger 03/08/2015, 10:42 AM

## 2015-03-09 ENCOUNTER — Telehealth: Payer: Self-pay | Admitting: Family Medicine

## 2015-03-09 MED ORDER — BOOST / RESOURCE BREEZE PO LIQD
1.0000 | Freq: Three times a day (TID) | ORAL | Status: DC
  Administered 2015-03-09: 1 via ORAL

## 2015-03-09 MED ORDER — HALOPERIDOL LACTATE 5 MG/ML IJ SOLN
1.0000 mg | INTRAMUSCULAR | Status: AC
  Administered 2015-03-09: 1 mg via INTRAVENOUS
  Filled 2015-03-09: qty 1

## 2015-03-09 MED ORDER — ENSURE ENLIVE PO LIQD
237.0000 mL | Freq: Two times a day (BID) | ORAL | Status: DC
  Administered 2015-03-09: 237 mL via ORAL

## 2015-03-09 MED ORDER — CIPROFLOXACIN HCL 500 MG PO TABS: 500.0000 mg | ORAL_TABLET | Freq: Two times a day (BID) | ORAL | Status: DC

## 2015-03-09 NOTE — Discharge Instructions (Signed)

## 2015-03-09 NOTE — Consult Note (Signed)
[place] [facility]                                                                                                                                                                                            Patient Demographics   Tammy Henry, is a 82 y.o. female, DOB - Feb 27, 1930, NX:[id number]  Admit date - 03/06/2015   Admitting Physician Florene Glen, MD  Outpatient Primary MD for the patient is Loistine Chance, MD   LOS - 3  Subjective: Had some abdominal pain earlier otherwise offers no complaints  Review of Systems:   CONSTITUTIONAL: No documented fever. No fatigue, weakness. No weight gain, no weight loss.  EYES: No blurry or double vision.  ENT: No tinnitus. No postnasal drip. No redness of the oropharynx.  RESPIRATORY: No cough, no wheeze, no hemoptysis. No dyspnea.  CARDIOVASCULAR: No chest pain. No orthopnea. No palpitations. No syncope.  GASTROINTESTINAL: No nausea, no vomiting or diarrhea. No abdominal pain. No melena or hematochezia.  GENITOURINARY: No dysuria or hematuria.  ENDOCRINE: No polyuria or nocturia. No heat or cold intolerance.  HEMATOLOGY: No anemia. No bruising. No bleeding.  INTEGUMENTARY: No rashes. No lesions.  MUSCULOSKELETAL: No arthritis. No swelling. No gout.  NEUROLOGIC: No numbness, tingling, or ataxia. No seizure-type activity.  PSYCHIATRIC: No anxiety. No insomnia. No ADD.    Vitals:   Filed Vitals:   03/08/15 2051 03/09/15 0623 03/09/15 0735 03/09/15 1401  BP: 147/48 133/56 163/53 156/57  Pulse: 64 65 62 64  Temp: 98.2 F (36.8 C) 97.4 F (36.3 C) 97.8 F (36.6 C) 97.6 F (36.4 C)  TempSrc: Oral Oral    Resp: 16 16 18 18   Height:      Weight:      SpO2: 99% 99% 99% 100%    Wt Readings from Last 3 Encounters:  03/06/15 38.873 kg (85 lb 11.2 oz)  02/24/15 39.871 kg (87 lb 14.4 oz)  09/24/14 42.95 kg (94 lb 11 oz)     Intake/Output Summary (Last 24 hours) at 03/09/15 1434 Last  data filed at 03/09/15 1344  Gross per 24 hour  Intake   1040 ml  Output   3750 ml  Net  -2710 ml    Physical Exam:   GENERAL: Pleasant-appearing in no apparent distress.  HEAD, EYES, EARS, NOSE AND THROAT: Atraumatic, normocephalic. Extraocular muscles are intact. Pupils equal and reactive to light. Sclerae anicteric. No conjunctival injection. No oro-pharyngeal erythema.  NECK: Supple. There is no jugular venous distention. No bruits, no lymphadenopathy, no thyromegaly.  HEART: Regular rate and rhythm,. No murmurs, no rubs,  no clicks.  LUNGS: Clear to auscultation bilaterally. No rales or rhonchi. No wheezes.  ABDOMEN: Soft, flat, nontender, nondistended. Diminished bowel site. No hepatosplenomegaly appreciated.  EXTREMITIES: No evidence of any cyanosis, clubbing, or peripheral edema.  +2 pedal and radial pulses bilaterally.  NEUROLOGIC: The patient is alert, awake, and oriented x3 with no focal motor or sensory deficits appreciated bilaterally.  SKIN: Moist and warm with no rashes appreciated.  Psych: Not anxious, depressed LN: No inguinal LN enlargement    Antibiotics   Anti-infectives    Start     Dose/Rate Route Frequency Ordered Stop   03/09/15 1000  cefTRIAXone (ROCEPHIN) 1 g in dextrose 5 % 50 mL IVPB  Status:  Discontinued     1 g 100 mL/hr over 30 Minutes Intravenous Every 24 hours 03/08/15 1206 03/08/15 1347   03/09/15 0000  ampicillin (OMNIPEN) 1 g in sodium chloride 0.9 % 50 mL IVPB     1 g 150 mL/hr over 20 Minutes Intravenous 4 times per day 03/08/15 1347     03/08/15 1645  ciprofloxacin (CIPRO) IVPB 400 mg  Status:  Discontinued     400 mg 200 mL/hr over 60 Minutes Intravenous Every 12 hours 03/08/15 1632 03/09/15 1351   03/07/15 0000  cefTRIAXone (ROCEPHIN) 1 g in dextrose 5 % 50 mL IVPB  Status:  Discontinued     1 g 100 mL/hr over 30 Minutes Intravenous Every 24 hours 03/06/15 2242 03/08/15 1206   03/06/15 2200  piperacillin-tazobactam (ZOSYN) IVPB 3.375 g      3.375 g 12.5 mL/hr over 240 Minutes Intravenous  Once 03/06/15 2147 03/06/15 2250      Medications   Scheduled Meds: . amLODipine  5 mg Oral Daily  . ampicillin (OMNIPEN) IV  1 g Intravenous 4 times per day  . antiseptic oral rinse  7 mL Mouth Rinse q12n4p  . chlorhexidine  15 mL Mouth Rinse BID  . feeding supplement  1 Container Oral TID BM  . heparin  5,000 Units Subcutaneous 3 times per day  . levothyroxine  75 mcg Oral QAC breakfast  . ondansetron (ZOFRAN) IV  4 mg Intravenous Once   Continuous Infusions: . dextrose 5 % and 0.45 % NaCl with KCl 20 mEq/L 125 mL/hr at 03/08/15 2019   PRN Meds:.acetaminophen, hydrALAZINE, HYDROcodone-acetaminophen, morphine injection, ondansetron **OR** ondansetron (ZOFRAN) IV, phenol   Data Review:   Micro Results Recent Results (from the past 240 hour(s))  Urine culture     Status: None   Collection Time: 03/06/15  8:48 PM  Result Value Ref Range Status   Specimen Description URINE, CLEAN CATCH  Final   Special Requests NONE  Final   Culture >=100,000 COLONIES/mL ESCHERICHIA COLI  Final   Report Status 03/08/2015 FINAL  Final   Organism ID, Bacteria ESCHERICHIA COLI  Final      Susceptibility   Escherichia coli - MIC*    AMPICILLIN 4 SENSITIVE Sensitive     CEFAZOLIN <=4 SENSITIVE Sensitive     CEFTRIAXONE <=1 SENSITIVE Sensitive     CIPROFLOXACIN <=0.25 SENSITIVE Sensitive     GENTAMICIN <=1 SENSITIVE Sensitive     IMIPENEM <=0.25 SENSITIVE Sensitive     NITROFURANTOIN 32 SENSITIVE Sensitive     TRIMETH/SULFA >=320 RESISTANT Resistant     Extended ESBL NEGATIVE Sensitive     PIP/TAZO Value in next row Sensitive      SENSITIVE<=4    ERTAPENEM Value in next row Sensitive      SENSITIVE<=0.5  LEVOFLOXACIN Value in next row Sensitive      SENSITIVE<=0.12    * >=100,000 COLONIES/mL ESCHERICHIA COLI    Radiology Reports US Abdomen Complete  03/03/2015  CLINICAL DATA:  Nausea.  Weight loss. EXAM: ABDOMEN ULTRASOUND COMPLETE  COMPARISON:  CT 11/18/2011. FINDINGS: Gallbladder: No gallstones or wall thickening visualized. No sonographic Murphy sign noted by sonographer. Common bile duct: Diameter: 4.3 mm Liver: No focal lesion identified. Within normal limits in parenchymal echogenicity. IVC: No abnormality visualized. Pancreas: Visualized portion unremarkable. Spleen: Size and appearance within normal limits. Right Kidney: Length: 9.7 cm. Echogenicity within normal limits. No mass. Moderate hydronephrosis visualized. Left Kidney: Length: 10.0 cm. Echogenicity within normal limits. No mass. Mild hydronephrosis visualized. The bladder is nondistended. Abdominal aorta: No aneurysm visualized. Aortic atherosclerotic vascular disease. Other findings: None. IMPRESSION: 1. Moderate right hydronephrosis. Mild left hydronephrosis. Bladder is nondistended . 2.  Aortic atherosclerotic vascular disease. Electronically Signed   By: Marcello Moores  Register   On: 03/03/2015 11:24   Ct Abdomen Pelvis W Contrast  03/06/2015  CLINICAL DATA:  82 year old female this history of abdominal pain for the past 2 days. Right-sided hydronephrosis noted on recent ultrasound examination. EXAM: CT ABDOMEN AND PELVIS WITH CONTRAST TECHNIQUE: Multidetector CT imaging of the abdomen and pelvis was performed using the standard protocol following bolus administration of intravenous contrast. CONTRAST:  15mL OMNIPAQUE IOHEXOL 300 MG/ML  SOLN COMPARISON:  No priors.  Abdominal ultrasound 03/03/2015. FINDINGS: Lower chest: Mild scarring in the visualize lung bases. Mild cardiomegaly. Atherosclerotic calcifications in the left main, left anterior descending and right coronary arteries. Hepatobiliary: No suspicious cystic or solid hepatic lesions. No intra or extrahepatic biliary ductal dilatation. Gallbladder is normal in appearance. Pancreas: No pancreatic mass. No pancreatic ductal dilatation. No pancreatic or peripancreatic fluid or inflammatory changes. Spleen: Unremarkable.  Adrenals/Urinary Tract: Moderate to severe right hydronephrosis and mild left hydronephrosis. Ureters do not appear to be dilated bilaterally. Urinary bladder is normal in appearance. No suspicious renal lesions are identified. 4 mm nonobstructive calculus in the lower pole collecting system of the right kidney. Sub cm low-attenuation lesion in the upper pole the right kidney is too small to definitively characterize, but is statistically likely a tiny cyst. Bilateral adrenal glands are normal in appearance. Stomach/Bowel: Stomach is normal in appearance. No pathologic dilatation of small bowel or colon. There is a long segment of intussusception involving the cecum, ascending colon and transverse colon to the level of the mid transverse colon. Some gas and stool is noted throughout the distal colon and rectum. Several scattered colonic diverticulae are noted, without surrounding inflammatory changes to suggest an acute diverticulitis at this time. Vascular/Lymphatic: Atherosclerosis throughout the abdominal and pelvic vasculature, without evidence of aneurysm or dissection. No lymphadenopathy noted in the abdomen or pelvis. Reproductive: Uterus and ovaries are atrophic. Other: No significant volume of ascites.  No pneumoperitoneum. Musculoskeletal: There are no aggressive appearing lytic or blastic lesions noted in the visualized portions of the skeleton. Chronic appearing compression fracture of L3 with approximately 50% loss of anterior vertebral body height. IMPRESSION: 1. Long segment intussusception involving the cecum, ascending colon and transverse colon to the level of the mid transverse colon. Despite this finding, there does not appear to be overt bowel obstruction at this time. Additionally, no signs of frank perforation are noted at this time. Surgical consultation is strongly recommended. 2. Moderate to severe right and mild left hydronephrosis. No ureteral dilatation. These findings Sahli suggest  bilaterally UPJ obstruction. 3. 4 mm nonobstructive calculus  in the lower pole collecting system of the right kidney. No ureteral stones. 4. Colonic diverticulosis 5. Extensive atherosclerosis, including left main and 2 vessel coronary artery disease. 6. Additional incidental findings, as above. These results were called by telephone at the time of interpretation on 03/06/2015 at 9:44 pm to Dr. Loura Pardon, who verbally acknowledged these results. Electronically Signed   By: Vinnie Langton M.D.   On: 03/06/2015 21:43   Dg Abd 2 Views  03/07/2015  CLINICAL DATA:  82 year old female with right abdominal pain. Proximal colon intussusception identified on recent CT. EXAM: ABDOMEN - 2 VIEW COMPARISON:  03/06/2015 CT FINDINGS: Evidence of proximal colon intussusception noted as visualized on recent CT as oral contrast remains. There is contrast within the descending colon noted. No dilated bowel loops are present. Contrast in the renal collecting systems identified with severe right hydronephrosis again noted. There is no evidence of pneumoperitoneum. IMPRESSION: Proximal colon intussusception again identified, without evidence of bowel obstruction or pneumoperitoneum. Severe right hydronephrosis again noted. Electronically Signed   By: Margarette Canada M.D.   On: 03/07/2015 10:11     CBC  Recent Labs Lab 03/06/15 1547 03/07/15 0528 03/08/15 0510  WBC 10.4 7.5 7.9  HGB 9.8* 8.6* 8.7*  HCT 32.2* 27.6* 28.3*  PLT 605* 454* 504*  MCV 64.2* 65.5* 64.8*  MCH 19.5* 20.3* 19.9*  MCHC 30.5* 31.0* 30.7*  RDW 19.9* 20.2* 20.4*    Chemistries   Recent Labs Lab 03/06/15 1547 03/07/15 0528 03/08/15 0510  NA 137 137 139  K 4.5 4.1 4.6  CL 99* 104 107  CO2 31 29 27   GLUCOSE 103* 119* 99  BUN 28* 21* 11  CREATININE 1.02* 1.11* 0.77  CALCIUM 9.4 8.2* 8.3*  AST 15  --   --   ALT 14  --   --   ALKPHOS 93  --   --   BILITOT 1.0  --   --     ------------------------------------------------------------------------------------------------------------------ estimated creatinine clearance is 32.7 mL/min (by C-G formula based on Cr of 0.77). ------------------------------------------------------------------------------------------------------------------ No results for input(s): HGBA1C in the last 72 hours. ------------------------------------------------------------------------------------------------------------------ No results for input(s): CHOL, HDL, LDLCALC, TRIG, CHOLHDL, LDLDIRECT in the last 72 hours. ------------------------------------------------------------------------------------------------------------------ No results for input(s): TSH, T4TOTAL, T3FREE, THYROIDAB in the last 72 hours.  Invalid input(s): FREET3 ------------------------------------------------------------------------------------------------------------------ No results for input(s): VITAMINB12, FOLATE, FERRITIN, TIBC, IRON, RETICCTPCT in the last 72 hours.  Coagulation profile No results for input(s): INR, PROTIME in the last 168 hours.  No results for input(s): DDIMER in the last 72 hours.  Cardiac Enzymes No results for input(s): CKMB, TROPONINI, MYOGLOBIN in the last 168 hours.  Invalid input(s): CK ------------------------------------------------------------------------------------------------------------------ Invalid input(s): POCBNP    Assessment & Plan   82 year old Caucasian female with history of dementia as well as essential hypertension who originally presented to the hospital 03/06/2015 for abdominal pain found to have intussusception   1. Urinary tract infection: Pansensitive Escherichia coli  can be discharged on oral Augmentin 2. Essential hypertension: Continue Norvasc and as necessary hydralazine the pressure stay 3. Hydronephrosis: No evidence of acute kidney injury continue with IV fluid hydration and monitor urine  output/renal function likely chronic in nature 4. Intussusception: Care per primary team  Patient medically stable please call with questions we'll sign off     Code Status Orders        Start     Ordered   03/06/15 2235  Full code   Continuous     03/06/15 2237  Advance Directive Documentation        Most Recent Value   Type of Advance Directive  -- Lavina Hamman has financial POA-oldest daughter, Otila Kluver Kissel]   Pre-existing out of facility DNR order (yellow form or pink MOST form)     "MOST" Form in Place?                DVT Prophylaxis  SCDs  Lab Results  Component Value Date   PLT 504* 03/08/2015     Time Spent in minutes  21minutes Dustin Flock M.D on 03/09/2015 at 2:34 PM  Between 7am to 6pm - Pager - [phone]  After 6pm go to [email or web address] - password [email or web address] [facility]   Office  [phone]

## 2015-03-09 NOTE — Progress Notes (Signed)
Discharge instructions explained to pt daughter/ verbalized an understanding/ iv removed/ will transport off unit via wheelchair.

## 2015-03-09 NOTE — Telephone Encounter (Signed)
Patient has been in the hospital since New Year Eve. Currently she had twisted bowels with urinary track infection and is still being monitored there. Daughter Tammy Henry) did cancel her appointment for the 5th.

## 2015-03-09 NOTE — Telephone Encounter (Signed)
Daughter Mel Almond) called today and mother was admitted to the hospital on New Year Eve

## 2015-03-09 NOTE — Progress Notes (Signed)
MD notified of pt increased activity, anxiety and confusion. Haldol 1mg  IV x 1 ordered. Will continue to monitor.

## 2015-03-09 NOTE — Discharge Summary (Signed)
Patient ID: Tammy Henry MRN: [id number] DOB/AGE: 1929/11/26 82 y.o.  Admit date: 03/06/2015 Discharge date: 03/09/2015  Discharge Diagnoses:  Intussusception  Procedures Performed: NGT Decompression  Discharged Condition: good  Hospital Course: Admitted for abdominal pain and found to have a chronic intussusception. Pain resolved and patient able to tolerate a diet and have bowel function prior to discharge  Discharge Orders: Discharge home  Disposition: Home  Discharge Medications:  Cipro 500mg  PO q 12 hours Resume home medications per med rec  Follwup: Follow-up Information    Follow up with [facility] In 1 week.   Specialty:  General Surgery   Why:  postop   Contact information:   [address] [place] [phone]      Signed: Clayburn Pert 03/09/2015, 5:04 PM

## 2015-03-09 NOTE — Final Progress Note (Signed)
CC: abdominal pain Subjective: Pain resolved, tolerated diet, desires to go home  Objective: Vital signs in last 24 hours: Temp:  [97.4 F (36.3 C)-98.2 F (36.8 C)] 97.6 F (36.4 C) (01/03 1401) Pulse Rate:  [62-77] 64 (01/03 1401) Resp:  [16-18] 18 (01/03 1401) BP: (133-183)/(48-62) 156/57 mmHg (01/03 1401) SpO2:  [99 %-100 %] 100 % (01/03 1401) Last BM Date: 03/09/15  Intake/Output from previous day: 01/02 0701 - 01/03 0700 In: 1711 [I.V.:1681; NG/GT:30] Out: 3550 [Urine:3550] Intake/Output this shift: Total I/O In: -  Out: 760 [Urine:760]  Physical exam:  Gen: NAD ABD: Soft, Benign, nontender and nondistedned  Lab Results: CBC   Recent Labs  03/07/15 0528 03/08/15 0510  WBC 7.5 7.9  HGB 8.6* 8.7*  HCT 27.6* 28.3*  PLT 454* 504*   BMET  Recent Labs  03/07/15 0528 03/08/15 0510  NA 137 139  K 4.1 4.6  CL 104 107  CO2 29 27  GLUCOSE 119* 99  BUN 21* 11  CREATININE 1.11* 0.77  CALCIUM 8.2* 8.3*   PT/INR No results for input(s): LABPROT, INR in the last 72 hours. ABG No results for input(s): PHART, HCO3 in the last 72 hours.  Invalid input(s): PCO2, PO2  Studies/Results: No results found.  Anti-infectives: Anti-infectives    Start     Dose/Rate Route Frequency Ordered Stop   03/09/15 1000  cefTRIAXone (ROCEPHIN) 1 g in dextrose 5 % 50 mL IVPB  Status:  Discontinued     1 g 100 mL/hr over 30 Minutes Intravenous Every 24 hours 03/08/15 1206 03/08/15 1347   03/09/15 0000  ampicillin (OMNIPEN) 1 g in sodium chloride 0.9 % 50 mL IVPB     1 g 150 mL/hr over 20 Minutes Intravenous 4 times per day 03/08/15 1347     03/09/15 0000  ciprofloxacin (CIPRO) 500 MG tablet     500 mg Oral 2 times daily 03/09/15 1704     03/08/15 1645  ciprofloxacin (CIPRO) IVPB 400 mg  Status:  Discontinued     400 mg 200 mL/hr over 60 Minutes Intravenous Every 12 hours 03/08/15 1632 03/09/15 1351   03/07/15 0000  cefTRIAXone (ROCEPHIN) 1 g in dextrose 5 % 50 mL IVPB   Status:  Discontinued     1 g 100 mL/hr over 30 Minutes Intravenous Every 24 hours 03/06/15 2242 03/08/15 1206   03/06/15 2200  piperacillin-tazobactam (ZOSYN) IVPB 3.375 g     3.375 g 12.5 mL/hr over 240 Minutes Intravenous  Once 03/06/15 2147 03/06/15 2250      Assessment/Plan:  82 year old female with a chronic intussusception, totally asymptomatic now. Discussed signs of recurrence and obstruction and to return should they occur. Will discharge home.   [facility] T. Adonis Huguenin, MD, FACS  03/09/2015

## 2015-03-09 NOTE — Progress Notes (Signed)
CC: Intussusception Subjective: Patient without any abdominal pain, nausea, vomiting overnight after removing the NG tube yesterday. Per her daughter patient did complain of some abdominal discomfort after eating last night but this subsided after she stopped eating. She did have a small bowel movement last night. No complaints this morning.  Objective: Vital signs in last 24 hours: Temp:  [97.4 F (36.3 C)-98.2 F (36.8 C)] 97.8 F (36.6 C) (01/03 0735) Pulse Rate:  [62-77] 62 (01/03 0735) Resp:  [16-18] 18 (01/03 0735) BP: (133-183)/(48-62) 163/53 mmHg (01/03 0735) SpO2:  [99 %-100 %] 99 % (01/03 0735) Last BM Date: 03/09/15  Intake/Output from previous day: 01/02 0701 - 01/03 0700 In: 1711 [I.V.:1681; NG/GT:30] Out: 3550 [Urine:3550] Intake/Output this shift:    Physical exam:  Gen.: No acute distress Chest: Clear to auscultation Heart: Regular rate and rhythm Abdomen: Soft, nontender, nondistended  Lab Results: CBC   Recent Labs  03/07/15 0528 03/08/15 0510  WBC 7.5 7.9  HGB 8.6* 8.7*  HCT 27.6* 28.3*  PLT 454* 504*   BMET  Recent Labs  03/07/15 0528 03/08/15 0510  NA 137 139  K 4.1 4.6  CL 104 107  CO2 29 27  GLUCOSE 119* 99  BUN 21* 11  CREATININE 1.11* 0.77  CALCIUM 8.2* 8.3*   PT/INR No results for input(s): LABPROT, INR in the last 72 hours. ABG No results for input(s): PHART, HCO3 in the last 72 hours.  Invalid input(s): PCO2, PO2  Studies/Results: No results found.  Anti-infectives: Anti-infectives    Start     Dose/Rate Route Frequency Ordered Stop   03/09/15 1000  cefTRIAXone (ROCEPHIN) 1 g in dextrose 5 % 50 mL IVPB  Status:  Discontinued     1 g 100 mL/hr over 30 Minutes Intravenous Every 24 hours 03/08/15 1206 03/08/15 1347   03/09/15 0000  ampicillin (OMNIPEN) 1 g in sodium chloride 0.9 % 50 mL IVPB     1 g 150 mL/hr over 20 Minutes Intravenous 4 times per day 03/08/15 1347     03/08/15 1645  ciprofloxacin (CIPRO) IVPB 400  mg     400 mg 200 mL/hr over 60 Minutes Intravenous Every 12 hours 03/08/15 1632     03/07/15 0000  cefTRIAXone (ROCEPHIN) 1 g in dextrose 5 % 50 mL IVPB  Status:  Discontinued     1 g 100 mL/hr over 30 Minutes Intravenous Every 24 hours 03/06/15 2242 03/08/15 1206   03/06/15 2200  piperacillin-tazobactam (ZOSYN) IVPB 3.375 g     3.375 g 12.5 mL/hr over 240 Minutes Intravenous  Once 03/06/15 2147 03/06/15 2250      Assessment/Plan:  82 year old female with a chronic intussusception. Has tolerated removal of NG tube without any return of her symptoms. Patient appears to be completely asymptomatic. If she can tolerate a diet without return of her pain, nausea, vomiting then we will transition to all oral medications and discharged home with oral antibiotics for her urinary tract infection.  Tammy Goetsch T. Adonis Huguenin, MD, FACS  03/09/2015

## 2015-03-10 ENCOUNTER — Telehealth: Payer: Self-pay

## 2015-03-10 ENCOUNTER — Inpatient Hospital Stay
Admission: EM | Admit: 2015-03-10 | Discharge: 2015-03-16 | DRG: 389 | Disposition: A | Discharge status: Home / Self Care | Payer: Medicare Other | Attending: General Surgery | Admitting: General Surgery

## 2015-03-10 ENCOUNTER — Encounter: Payer: Self-pay | Admitting: Urgent Care

## 2015-03-10 ENCOUNTER — Emergency Department: Payer: Medicare Other

## 2015-03-10 DIAGNOSIS — K641 Second degree hemorrhoids: Secondary | ICD-10-CM | POA: Diagnosis not present

## 2015-03-10 DIAGNOSIS — Z87440 Personal history of urinary (tract) infections: Secondary | ICD-10-CM

## 2015-03-10 DIAGNOSIS — D490 Neoplasm of unspecified behavior of digestive system: Secondary | ICD-10-CM | POA: Diagnosis present

## 2015-03-10 DIAGNOSIS — E039 Hypothyroidism, unspecified: Secondary | ICD-10-CM | POA: Diagnosis present

## 2015-03-10 DIAGNOSIS — K561 Intussusception: Principal | ICD-10-CM | POA: Diagnosis present

## 2015-03-10 DIAGNOSIS — Z825 Family history of asthma and other chronic lower respiratory diseases: Secondary | ICD-10-CM | POA: Diagnosis not present

## 2015-03-10 DIAGNOSIS — Z888 Allergy status to other drugs, medicaments and biological substances status: Secondary | ICD-10-CM

## 2015-03-10 DIAGNOSIS — Z681 Body mass index (BMI) 19 or less, adult: Secondary | ICD-10-CM | POA: Diagnosis not present

## 2015-03-10 DIAGNOSIS — I2510 Atherosclerotic heart disease of native coronary artery without angina pectoris: Secondary | ICD-10-CM | POA: Diagnosis present

## 2015-03-10 DIAGNOSIS — R079 Chest pain, unspecified: Secondary | ICD-10-CM

## 2015-03-10 DIAGNOSIS — D473 Essential (hemorrhagic) thrombocythemia: Secondary | ICD-10-CM | POA: Diagnosis present

## 2015-03-10 DIAGNOSIS — M810 Age-related osteoporosis without current pathological fracture: Secondary | ICD-10-CM | POA: Diagnosis present

## 2015-03-10 DIAGNOSIS — Z820 Family history of epilepsy and other diseases of the nervous system: Secondary | ICD-10-CM | POA: Diagnosis not present

## 2015-03-10 DIAGNOSIS — Z9842 Cataract extraction status, left eye: Secondary | ICD-10-CM | POA: Diagnosis not present

## 2015-03-10 DIAGNOSIS — Z803 Family history of malignant neoplasm of breast: Secondary | ICD-10-CM | POA: Diagnosis not present

## 2015-03-10 DIAGNOSIS — E46 Unspecified protein-calorie malnutrition: Secondary | ICD-10-CM | POA: Diagnosis not present

## 2015-03-10 DIAGNOSIS — K649 Unspecified hemorrhoids: Secondary | ICD-10-CM | POA: Insufficient documentation

## 2015-03-10 DIAGNOSIS — Z515 Encounter for palliative care: Secondary | ICD-10-CM | POA: Diagnosis not present

## 2015-03-10 DIAGNOSIS — Z9841 Cataract extraction status, right eye: Secondary | ICD-10-CM | POA: Diagnosis not present

## 2015-03-10 DIAGNOSIS — Z818 Family history of other mental and behavioral disorders: Secondary | ICD-10-CM

## 2015-03-10 DIAGNOSIS — I10 Essential (primary) hypertension: Secondary | ICD-10-CM | POA: Diagnosis present

## 2015-03-10 DIAGNOSIS — F0150 Vascular dementia without behavioral disturbance: Secondary | ICD-10-CM | POA: Diagnosis present

## 2015-03-10 DIAGNOSIS — Z882 Allergy status to sulfonamides status: Secondary | ICD-10-CM | POA: Diagnosis not present

## 2015-03-10 DIAGNOSIS — Z807 Family history of other malignant neoplasms of lymphoid, hematopoietic and related tissues: Secondary | ICD-10-CM

## 2015-03-10 DIAGNOSIS — N1330 Unspecified hydronephrosis: Secondary | ICD-10-CM | POA: Diagnosis not present

## 2015-03-10 DIAGNOSIS — Z8673 Personal history of transient ischemic attack (TIA), and cerebral infarction without residual deficits: Secondary | ICD-10-CM

## 2015-03-10 DIAGNOSIS — N132 Hydronephrosis with renal and ureteral calculous obstruction: Secondary | ICD-10-CM | POA: Diagnosis present

## 2015-03-10 LAB — CBC
HEMATOCRIT: 30.5 % — AB (ref 35.0–47.0)
Hemoglobin: 9.4 g/dL — ABNORMAL LOW (ref 12.0–16.0)
MCH: 19.7 pg — AB (ref 26.0–34.0)
MCHC: 30.7 g/dL — ABNORMAL LOW (ref 32.0–36.0)
MCV: 64.2 fL — AB (ref 80.0–100.0)
Platelets: 576 10*3/uL — ABNORMAL HIGH (ref 150–440)
RBC: 4.76 MIL/uL (ref 3.80–5.20)
RDW: 20.5 % — ABNORMAL HIGH (ref 11.5–14.5)
WBC: 7 10*3/uL (ref 3.6–11.0)

## 2015-03-10 LAB — COMPREHENSIVE METABOLIC PANEL
ALT: 12 U/L — AB (ref 14–54)
AST: 14 U/L — ABNORMAL LOW (ref 15–41)
Albumin: 3.1 g/dL — ABNORMAL LOW (ref 3.5–5.0)
Alkaline Phosphatase: 85 U/L (ref 38–126)
Anion gap: 7 (ref 5–15)
BILIRUBIN TOTAL: 0.3 mg/dL (ref 0.3–1.2)
BUN: 13 mg/dL (ref 6–20)
CALCIUM: 9 mg/dL (ref 8.9–10.3)
CO2: 27 mmol/L (ref 22–32)
CREATININE: 0.8 mg/dL (ref 0.44–1.00)
Chloride: 103 mmol/L (ref 101–111)
GFR calc Af Amer: >60 mL/min (ref >60)
Glucose, Bld: 97 mg/dL (ref 65–99)
Potassium: 4.1 mmol/L (ref 3.5–5.1)
Sodium: 137 mmol/L (ref 135–145)
TOTAL PROTEIN: 6.3 g/dL — AB (ref 6.5–8.1)

## 2015-03-10 LAB — LACTIC ACID, PLASMA: LACTIC ACID, VENOUS: 0.9 mmol/L (ref 0.5–2.0)

## 2015-03-10 MED ORDER — SODIUM CHLORIDE 0.9 % IV BOLUS (SEPSIS)
1000.0000 mL | Freq: Once | INTRAVENOUS | Status: AC
  Administered 2015-03-10: 1000 mL via INTRAVENOUS

## 2015-03-10 MED ORDER — IOHEXOL 240 MG/ML SOLN
50.0000 mL | INTRAMUSCULAR | Status: AC
Start: 2015-03-10 — End: 2015-03-10
  Administered 2015-03-10: 50 mL via ORAL

## 2015-03-10 MED ORDER — FENTANYL CITRATE (PF) 100 MCG/2ML IJ SOLN
50.0000 ug | Freq: Once | INTRAMUSCULAR | Status: AC
  Administered 2015-03-10: 50 ug via INTRAVENOUS

## 2015-03-10 MED ORDER — IOHEXOL 300 MG/ML  SOLN
80.0000 mL | Freq: Once | INTRAMUSCULAR | Status: AC | PRN
  Administered 2015-03-10: 80 mL via INTRAVENOUS

## 2015-03-10 MED ORDER — FENTANYL CITRATE (PF) 100 MCG/2ML IJ SOLN
25.0000 ug | Freq: Once | INTRAMUSCULAR | Status: AC
  Administered 2015-03-10: 25 ug via INTRAVENOUS
  Filled 2015-03-10: qty 2

## 2015-03-10 MED ORDER — FENTANYL CITRATE (PF) 100 MCG/2ML IJ SOLN
INTRAMUSCULAR | Status: AC
  Administered 2015-03-10: 50 ug via INTRAVENOUS
  Filled 2015-03-10: qty 2

## 2015-03-10 MED ORDER — ONDANSETRON HCL 4 MG/2ML IJ SOLN
4.0000 mg | Freq: Once | INTRAMUSCULAR | Status: AC
  Administered 2015-03-10: 4 mg via INTRAVENOUS

## 2015-03-10 MED ORDER — ONDANSETRON HCL 4 MG/2ML IJ SOLN
INTRAMUSCULAR | Status: AC
  Administered 2015-03-10: 4 mg via INTRAVENOUS
  Filled 2015-03-10: qty 2

## 2015-03-10 NOTE — ED Notes (Addendum)
Patient presents with c/o intermittent abd pain and decreased PO intake since being discharged from the hospital yesterday. Patient was here on 12/31 and was Dx'd with a "twisted bowel" and admitted until 1/3. Family presents tonight reporting that patient "just doesn't look good. She is pale. She is not able to eat and has only drank a little bit of Ensure today." Also has a known UTI per family report.

## 2015-03-10 NOTE — Telephone Encounter (Signed)
Per the request of Dr. Steele Sizer, I contacted this patient to see how she is doing. I spoke with her daughter, Ilina Mcnichols and she stated that her mom is tolerating her food well and currently she is not in any pain. She stated that they have been monitoring her and if they feel she is not doing well then they will take her back to the hospital.   I confirmed with her that they will be coming in for a hospital follow-up on 03/16/15 at 1:20pm and she said yes.

## 2015-03-10 NOTE — ED Provider Notes (Signed)
[facility] Emergency Department Provider Note  ____________________________________________  Time seen: Approximately 8:26 PM  I have reviewed the triage vital signs and the nursing notes.   HISTORY  Chief Complaint Abdominal Pain    HPI Tammy Henry is a 82 y.o. female discharge 1/1 for chronic intussusception presenting with continued abdominal pain. Patientwas admitted and found to have chronic intussusception involving the cecum, ascending colon and transverse colon which spontaneously resolved. Since arriving home, she has a chronic dull ache mostly located in the right lower quadrant but has significantly increased pain with any by mouth intake. She has had decreased by mouth intake without any nausea, vomiting, or diarrhea. She has had a small amount of stool including 2 bowel movements today. She has had black tarry stools today. No fever, chills.   Past Medical History  Diagnosis Date  . Other frontotemporal dementia   . Acute kidney failure, unspecified ([place])   . Osteoporosis, unspecified   . Essential hypertension, benign   . Depressive disorder, not elsewhere classified   . Edema   . Other psoriasis   . Unspecified venous (peripheral) insufficiency   . Anxiety state, unspecified   . Unspecified hypothyroidism   . Functional diarrhea   . Unspecified pruritic disorder   . Other specified disorders of shoulder joint   . Cachexia ([place])   . Dehydration   . Urinary tract infection, site not specified   . Other and unspecified disc disorder of cervical region   . Early satiety   . Iron deficiency anemia, unspecified   . Nonspecific abnormal electrocardiogram (ECG) (EKG)     Patient Active Problem List   Diagnosis Date Noted  . Protein-calorie malnutrition, severe 03/08/2015  . Intussusception ([place]) 03/06/2015  . Urinary tract infection 03/06/2015  . Lower abdominal pain   . Nausea 02/24/2015  . Protein-calorie malnutrition ([place])  02/24/2015  . Cachexia ([place]) 12/28/2014  . Chronic eczema 12/28/2014  . Pain in shoulder 12/28/2014  . Chronic kidney disease (CKD), stage III (moderate) 12/28/2014  . Major depressive disorder ([place]) 12/28/2014  . Dementia of frontal lobe type 12/28/2014  . History of blood transfusion 12/28/2014  . Anemia, iron deficiency 12/28/2014  . Insomnia 12/28/2014  . Itch of skin 12/28/2014  . Iron deficiency anemia 12/28/2014  . 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 Adult hypothyroidism 05/10/2009  . Varicose vein 05/10/2009  . Chronic venous insufficiency 02/06/2007  . Hypertension goal BP (blood pressure) < 140/90 07/11/2006  . OP (osteoporosis) 07/11/2006    Past Surgical History  Procedure Laterality Date  . Cataract extraction, bilateral      Current Outpatient Rx  Name  Route  Sig  Dispense  Refill  . amLODipine (NORVASC) 5 MG tablet   Oral   Take 1 tablet (5 mg total) by mouth daily.   90 tablet   1   . ciprofloxacin (CIPRO) 500 MG tablet   Oral   Take 1 tablet (500 mg total) by mouth 2 (two) times daily.   20 tablet   0   . ferrous sulfate 325 (65 FE) MG tablet   Oral   Take 325 mg by mouth daily with breakfast.         . levothyroxine (SYNTHROID, LEVOTHROID) 75 MCG tablet      take 1 tablet by mouth daily   30 tablet   2     DX: 244.9, must be seen   . Multiple Vitamin (MULTIVITAMIN WITH MINERALS) TABS tablet   Oral  Take 1 tablet by mouth daily.         . ondansetron (ZOFRAN-ODT) 8 MG disintegrating tablet   Oral   Take 1 tablet (8 mg total) by mouth every 8 (eight) hours as needed for nausea or vomiting. Patient not taking: Reported on 03/06/2015   30 tablet   1     Allergies Lamisil and Sulfur  Family History  Problem Relation Age of Onset  . Lymphoma Mother   . Alzheimer's disease Mother   . Alzheimer's disease Father   . Breast cancer Sister   . Depression Daughter   . Asthma Daughter   . Schizophrenia Daughter   . Thyroid disease  Daughter     Social History Social History  Substance Use Topics  . Smoking status: Never Smoker   . Smokeless tobacco: Never Used  . Alcohol Use: No    Review of Systems Constitutional: No fever/chills. No lightheadedness or syncope. Eyes: No visual changes. ENT: No sore throat. Cardiovascular: Denies chest pain, palpitations. Respiratory: Denies shortness of breath.  No cough. Gastrointestinal: Positive abdominal pain.  No nausea, no vomiting.  No diarrhea.  No constipation. Genitourinary: Negative for dysuria. Musculoskeletal: Negative for back pain. Skin: Negative for rash. Neurological: Negative for headaches, focal weakness or numbness.  10-point ROS otherwise negative.  ____________________________________________   PHYSICAL EXAM:  VITAL SIGNS: ED Triage Vitals  Enc Vitals Group     BP 03/10/15 2012 199/65 mmHg     Pulse Rate 03/10/15 2012 66     Resp 03/10/15 2012 16     Temp 03/10/15 2012 97.6 F (36.4 C)     Temp Source 03/10/15 2012 Oral     SpO2 03/10/15 2012 100 %     Weight --      Height --      Head Cir --      Peak Flow --      Pain Score --      Pain Loc --      Pain Edu? --      Excl. in [place]? --     Constitutional: Patient is chronically ill appearing but alert and oriented and answering questions appropriately.  Eyes: Conjunctivae are normal.  EOMI. no scleral icterus. Head: Atraumatic. Nose: No congestion/rhinnorhea. Mouth/Throat: Mucous membranes are dry.  Neck: No stridor.  Supple.  No JVD. Cardiovascular: Normal rate, regular rhythm. No murmurs, rubs or gallops.  Respiratory: Normal respiratory effort.  No retractions. Lungs CTAB.  No wheezes, rales or ronchi. Gastrointestinal: Abdomen is thin, soft. There is a palpable mass that is soft and tender to palpation which encompasses the entire suprapubic, right lower quadrant and periumbilical area. It is not consistent with a hernia. No guarding or rebound, no peritoneal  signs. Musculoskeletal: No LE edema.  Neurologic:  Normal speech and language. No gross focal neurologic deficits are appreciated.  Skin:  Skin is warm, dry and intact. No rash noted. Psychiatric: Mood and affect are normal. Speech and behavior are normal.  Normal judgement.  ____________________________________________   LABS (all labs ordered are listed, but only abnormal results are displayed)  Labs Reviewed  CBC - Abnormal; Notable for the following:    Hemoglobin 9.4 (*)    HCT 30.5 (*)    MCV 64.2 (*)    MCH 19.7 (*)    MCHC 30.7 (*)    RDW 20.5 (*)    Platelets 576 (*)    All other components within normal limits  COMPREHENSIVE METABOLIC PANEL - Abnormal;  Notable for the following:    Total Protein 6.3 (*)    Albumin 3.1 (*)    AST 14 (*)    ALT 12 (*)    All other components within normal limits  LACTIC ACID, PLASMA  LACTIC ACID, PLASMA   ____________________________________________  EKG  Not indicated ____________________________________________  RADIOLOGY  No results found.  ____________________________________________   PROCEDURES  Procedure(s) performed: None  Critical Care performed: No ____________________________________________   INITIAL IMPRESSION / ASSESSMENT AND PLAN / ED COURSE  Pertinent labs & imaging results that were available during my care of the patient were reviewed by me and considered in my medical decision making (see chart for details).  82 y.o. female with a history of chronic conception presenting with right lower quadrant pain that is worse with eating. It is possible that the patient has recurrent intussusception. We will also evaluate for perforation although this is less likely. There is also less of a chance of an infectious etiology including abscess. I have put her in for CT scan but will also involve the general surgeon early in her care.  ----------------------------------------- 11:27 PM on  03/10/2015 -----------------------------------------  CT scan shows worsening intussusception then now involves the cecum up through the descending colon and further into the transverse colon. I have contacted Dr. Felton Clinton who will admit the patient. The patient will likely require surgery. Plan admission.  ____________________________________________  FINAL CLINICAL IMPRESSION(S) / ED DIAGNOSES  Final diagnoses:  Intussusception intestine ([place])      NEW MEDICATIONS STARTED DURING THIS VISIT:  New Prescriptions   No medications on file      Eula Listen, MD 03/10/15 2327

## 2015-03-11 ENCOUNTER — Ambulatory Visit: Payer: Medicare Other | Admitting: Hematology and Oncology

## 2015-03-11 ENCOUNTER — Inpatient Hospital Stay: Payer: Medicare Other

## 2015-03-11 ENCOUNTER — Other Ambulatory Visit: Payer: Medicare Other

## 2015-03-11 ENCOUNTER — Inpatient Hospital Stay: Payer: Medicare Other | Admitting: Hematology and Oncology

## 2015-03-11 ENCOUNTER — Ambulatory Visit: Payer: Self-pay | Admitting: Family Medicine

## 2015-03-11 DIAGNOSIS — I2510 Atherosclerotic heart disease of native coronary artery without angina pectoris: Secondary | ICD-10-CM | POA: Diagnosis present

## 2015-03-11 DIAGNOSIS — Z9842 Cataract extraction status, left eye: Secondary | ICD-10-CM | POA: Diagnosis not present

## 2015-03-11 DIAGNOSIS — Z807 Family history of other malignant neoplasms of lymphoid, hematopoietic and related tissues: Secondary | ICD-10-CM | POA: Diagnosis not present

## 2015-03-11 DIAGNOSIS — K561 Intussusception: Secondary | ICD-10-CM | POA: Diagnosis not present

## 2015-03-11 DIAGNOSIS — D490 Neoplasm of unspecified behavior of digestive system: Secondary | ICD-10-CM | POA: Diagnosis present

## 2015-03-11 DIAGNOSIS — I10 Essential (primary) hypertension: Secondary | ICD-10-CM | POA: Diagnosis present

## 2015-03-11 DIAGNOSIS — K641 Second degree hemorrhoids: Secondary | ICD-10-CM | POA: Diagnosis not present

## 2015-03-11 DIAGNOSIS — E46 Unspecified protein-calorie malnutrition: Secondary | ICD-10-CM | POA: Diagnosis not present

## 2015-03-11 DIAGNOSIS — Z820 Family history of epilepsy and other diseases of the nervous system: Secondary | ICD-10-CM | POA: Diagnosis not present

## 2015-03-11 DIAGNOSIS — Z888 Allergy status to other drugs, medicaments and biological substances status: Secondary | ICD-10-CM | POA: Diagnosis not present

## 2015-03-11 DIAGNOSIS — Z882 Allergy status to sulfonamides status: Secondary | ICD-10-CM | POA: Diagnosis not present

## 2015-03-11 DIAGNOSIS — R079 Chest pain, unspecified: Secondary | ICD-10-CM | POA: Diagnosis not present

## 2015-03-11 DIAGNOSIS — Z825 Family history of asthma and other chronic lower respiratory diseases: Secondary | ICD-10-CM | POA: Diagnosis not present

## 2015-03-11 DIAGNOSIS — E039 Hypothyroidism, unspecified: Secondary | ICD-10-CM | POA: Diagnosis present

## 2015-03-11 DIAGNOSIS — F0150 Vascular dementia without behavioral disturbance: Secondary | ICD-10-CM | POA: Diagnosis not present

## 2015-03-11 DIAGNOSIS — N132 Hydronephrosis with renal and ureteral calculous obstruction: Secondary | ICD-10-CM | POA: Diagnosis present

## 2015-03-11 DIAGNOSIS — Z9841 Cataract extraction status, right eye: Secondary | ICD-10-CM | POA: Diagnosis not present

## 2015-03-11 DIAGNOSIS — Z818 Family history of other mental and behavioral disorders: Secondary | ICD-10-CM | POA: Diagnosis not present

## 2015-03-11 DIAGNOSIS — Z515 Encounter for palliative care: Secondary | ICD-10-CM | POA: Diagnosis not present

## 2015-03-11 DIAGNOSIS — M810 Age-related osteoporosis without current pathological fracture: Secondary | ICD-10-CM | POA: Diagnosis present

## 2015-03-11 DIAGNOSIS — Z803 Family history of malignant neoplasm of breast: Secondary | ICD-10-CM | POA: Diagnosis not present

## 2015-03-11 DIAGNOSIS — Z681 Body mass index (BMI) 19 or less, adult: Secondary | ICD-10-CM | POA: Diagnosis not present

## 2015-03-11 DIAGNOSIS — Z8673 Personal history of transient ischemic attack (TIA), and cerebral infarction without residual deficits: Secondary | ICD-10-CM | POA: Diagnosis not present

## 2015-03-11 DIAGNOSIS — D473 Essential (hemorrhagic) thrombocythemia: Secondary | ICD-10-CM | POA: Diagnosis present

## 2015-03-11 DIAGNOSIS — K649 Unspecified hemorrhoids: Secondary | ICD-10-CM | POA: Diagnosis not present

## 2015-03-11 DIAGNOSIS — Z87440 Personal history of urinary (tract) infections: Secondary | ICD-10-CM | POA: Diagnosis not present

## 2015-03-11 LAB — BASIC METABOLIC PANEL
ANION GAP: 3 — AB (ref 5–15)
BUN: 10 mg/dL (ref 6–20)
CHLORIDE: 104 mmol/L (ref 101–111)
CO2: 28 mmol/L (ref 22–32)
Calcium: 8.5 mg/dL — ABNORMAL LOW (ref 8.9–10.3)
Creatinine, Ser: 0.82 mg/dL (ref 0.44–1.00)
GFR calc non Af Amer: >60 mL/min (ref >60)
Glucose, Bld: 106 mg/dL — ABNORMAL HIGH (ref 65–99)
POTASSIUM: 4 mmol/L (ref 3.5–5.1)
SODIUM: 135 mmol/L (ref 135–145)

## 2015-03-11 LAB — URINALYSIS COMPLETE WITH MICROSCOPIC (ARMC ONLY)
BACTERIA UA: NONE SEEN
BILIRUBIN URINE: NEGATIVE
GLUCOSE, UA: NEGATIVE mg/dL
HGB URINE DIPSTICK: NEGATIVE
KETONES UR: NEGATIVE mg/dL
NITRITE: NEGATIVE
Protein, ur: NEGATIVE mg/dL
SPECIFIC GRAVITY, URINE: 1.01 (ref 1.005–1.030)
pH: 6 (ref 5.0–8.0)

## 2015-03-11 LAB — LACTIC ACID, PLASMA: Lactic Acid, Venous: 0.9 mmol/L (ref 0.5–2.0)

## 2015-03-11 MED ORDER — CETYLPYRIDINIUM CHLORIDE 0.05 % MT LIQD
7.0000 mL | Freq: Two times a day (BID) | OROMUCOSAL | Status: DC
  Administered 2015-03-11 (×2): 7 mL via OROMUCOSAL

## 2015-03-11 MED ORDER — AMLODIPINE BESYLATE 5 MG PO TABS
5.0000 mg | ORAL_TABLET | Freq: Once | ORAL | Status: AC
  Administered 2015-03-11: 5 mg via ORAL
  Filled 2015-03-11: qty 1

## 2015-03-11 MED ORDER — ACETAMINOPHEN 650 MG RE SUPP: 650.0000 mg | Freq: Four times a day (QID) | RECTAL | Status: DC | PRN

## 2015-03-11 MED ORDER — DIPHENHYDRAMINE HCL 50 MG/ML IJ SOLN
12.5000 mg | Freq: Four times a day (QID) | INTRAMUSCULAR | Status: DC | PRN
  Administered 2015-03-12 – 2015-03-16 (×7): 12.5 mg via INTRAVENOUS
  Filled 2015-03-11 (×8): qty 1

## 2015-03-11 MED ORDER — MORPHINE SULFATE (PF) 2 MG/ML IV SOLN
1.0000 mg | INTRAVENOUS | Status: DC | PRN
  Administered 2015-03-12 – 2015-03-15 (×5): 1 mg via INTRAVENOUS
  Filled 2015-03-11 (×5): qty 1

## 2015-03-11 MED ORDER — ENOXAPARIN SODIUM 30 MG/0.3ML ~~LOC~~ SOLN
30.0000 mg | Freq: Every day | SUBCUTANEOUS | Status: DC
  Administered 2015-03-11 – 2015-03-16 (×6): 30 mg via SUBCUTANEOUS
  Filled 2015-03-11 (×6): qty 0.3

## 2015-03-11 MED ORDER — ACETAMINOPHEN 325 MG PO TABS
650.0000 mg | ORAL_TABLET | Freq: Four times a day (QID) | ORAL | Status: DC | PRN
  Administered 2015-03-11: 650 mg via ORAL
  Filled 2015-03-11: qty 2

## 2015-03-11 MED ORDER — KCL IN DEXTROSE-NACL 20-5-0.45 MEQ/L-%-% IV SOLN
INTRAVENOUS | Status: DC
  Administered 2015-03-11 – 2015-03-15 (×10): via INTRAVENOUS
  Filled 2015-03-11 (×17): qty 1000

## 2015-03-11 MED ORDER — AMLODIPINE BESYLATE 5 MG PO TABS
5.0000 mg | ORAL_TABLET | Freq: Every day | ORAL | Status: DC
  Administered 2015-03-12 – 2015-03-16 (×5): 5 mg via ORAL
  Filled 2015-03-11 (×5): qty 1

## 2015-03-11 MED ORDER — ONDANSETRON HCL 4 MG PO TABS
4.0000 mg | ORAL_TABLET | Freq: Four times a day (QID) | ORAL | Status: DC | PRN
  Administered 2015-03-13 – 2015-03-15 (×3): 4 mg via ORAL
  Filled 2015-03-11 (×3): qty 1

## 2015-03-11 MED ORDER — LEVOTHYROXINE SODIUM 75 MCG PO TABS
75.0000 ug | ORAL_TABLET | Freq: Every day | ORAL | Status: DC
  Administered 2015-03-11 – 2015-03-16 (×6): 75 ug via ORAL
  Filled 2015-03-11 (×6): qty 1

## 2015-03-11 MED ORDER — ENSURE ENLIVE PO LIQD
237.0000 mL | Freq: Two times a day (BID) | ORAL | Status: DC
  Administered 2015-03-12 – 2015-03-16 (×9): 237 mL via ORAL

## 2015-03-11 MED ORDER — ONDANSETRON HCL 4 MG/2ML IJ SOLN
4.0000 mg | Freq: Four times a day (QID) | INTRAMUSCULAR | Status: DC | PRN
  Administered 2015-03-12: 4 mg via INTRAVENOUS
  Filled 2015-03-11: qty 2

## 2015-03-11 MED ORDER — PHENYLEPHRINE IN HARD FAT 0.25 % RE SUPP
1.0000 | Freq: Two times a day (BID) | RECTAL | Status: DC
  Administered 2015-03-11 – 2015-03-16 (×10): 1 via RECTAL
  Filled 2015-03-11 (×13): qty 1

## 2015-03-11 NOTE — Progress Notes (Signed)
Initial Nutrition Assessment  DOCUMENTATION CODES:   Severe malnutrition in context of chronic illness  INTERVENTION:  Meals and snacks: Await poc and diet progression Medical Nutrition Supplement Therapy: once able to take po intake recommend ensure enlive TID for added nutrition   NUTRITION DIAGNOSIS:   Inadequate oral intake related to altered GI function as evidenced by NPO status.    GOAL:   Patient will meet greater than or equal to 90% of their needs    MONITOR:    (Energy intake, Digestive system)  REASON FOR ASSESSMENT:   Malnutrition Screening Tool    ASSESSMENT:      Pt just discharged and admitted for ileocolonic intussusception. Palliative care consult placed.    Past Medical History  Diagnosis Date  . Other frontotemporal dementia   . Acute kidney failure, unspecified ([place])   . Osteoporosis, unspecified   . Essential hypertension, benign   . Depressive disorder, not elsewhere classified   . Edema   . Other psoriasis   . Unspecified venous (peripheral) insufficiency   . Anxiety state, unspecified   . Unspecified hypothyroidism   . Functional diarrhea   . Unspecified pruritic disorder   . Other specified disorders of shoulder joint   . Cachexia ([place])   . Dehydration   . Urinary tract infection, site not specified   . Other and unspecified disc disorder of cervical region   . Early satiety   . Iron deficiency anemia, unspecified   . Nonspecific abnormal electrocardiogram (ECG) (EKG)     Current Nutrition: clear liquids  Food/Nutrition-Related History: Pt now with poor po intake for past week secondary to poor po intake prior to admission and limited intake during recent admission   Scheduled Medications:  . amLODipine  5 mg Oral Daily  . antiseptic oral rinse  7 mL Mouth Rinse q12n4p  . enoxaparin (LOVENOX) injection  30 mg Subcutaneous Daily  . levothyroxine  75 mcg Oral QAC breakfast    Continuous Medications:  . dextrose 5 % and  0.45 % NaCl with KCl 20 mEq/L 100 mL/hr at 03/11/15 0201     Electrolyte/Renal Profile and Glucose Profile:   Recent Labs Lab 03/08/15 0510 03/10/15 2049 03/11/15 0326  NA 139 137 135  K 4.6 4.1 4.0  CL 107 103 104  CO2 27 27 28   BUN 11 13 10   CREATININE 0.77 0.80 0.82  CALCIUM 8.3* 9.0 8.5*  GLUCOSE 99 97 106*   Protein Profile:  Recent Labs Lab 03/06/15 1547 03/10/15 2049  ALBUMIN 3.4* 3.1*    Gastrointestinal Profile: Last BM:1/5   Nutrition-Focused Physical Exam Findings: Nutrition-Focused physical exam completed. Findings are severe fat depletion, moderate to severe muscle depletion, and no edema.      Weight Change: 15% wt loss in the last 7 months per wt encounters    Diet Order:  Diet clear liquid Room service appropriate?: Yes; Fluid consistency:: Thin  Skin:   reviewed   Height:   Ht Readings from Last 1 Encounters:  03/11/15 5\' 4"  (1.626 m)    Weight:   Wt Readings from Last 1 Encounters:  03/11/15 83 lb 14.4 oz (38.057 kg)    Ideal Body Weight:     BMI:  Body mass index is 14.39 kg/(m^2).  Estimated Nutritional Needs:   Kcal:  BEE 990 kcals (IF 1.0-1.3, AF 1.3) CD:[id number] kcals Using IBW of 55kg  Protein:  (1.0-1.2 g/kg) 55-66 g/kg  Fluid:  (25-18ml/kg) 1375-1620ml/d  EDUCATION NEEDS:   No education needs identified  at this time  [place]. Zenia Resides, [place], [place] (pager) Weekend/On-Call pager [phone])

## 2015-03-11 NOTE — Progress Notes (Signed)
Spoke with Dr.Cooper about elevated BP 180/78. No new orders at this time. Also Dr. Burt Knack verbalized that it was ok for patient to have clear ensure per daughter's request. Tammy Henry

## 2015-03-11 NOTE — H&P (Signed)
Tammy Henry is an 82 y.o. female.   Chief Complaint: Recurrent abdominal pain, inability to take p.o. HPI:   82 y/o female with dementia, prior CVA and known ileocolonic intussusception who was discharged on 1/3/201 following a several day hospital course where she passed gas and BM and tolerated diet.  Decision not to intervene was made at that time.  Code status was not addressed by family since discharge.  Known right sided hydronephrosis.  Albumin on admission 12/31 was 3.4  Past Medical History  Diagnosis Date  . Other frontotemporal dementia   . Acute kidney failure, unspecified ([place])   . Osteoporosis, unspecified   . Essential hypertension, benign   . Depressive disorder, not elsewhere classified   . Edema   . Other psoriasis   . Unspecified venous (peripheral) insufficiency   . Anxiety state, unspecified   . Unspecified hypothyroidism   . Functional diarrhea   . Unspecified pruritic disorder   . Other specified disorders of shoulder joint   . Cachexia ([place])   . Dehydration   . Urinary tract infection, site not specified   . Other and unspecified disc disorder of cervical region   . Early satiety   . Iron deficiency anemia, unspecified   . Nonspecific abnormal electrocardiogram (ECG) (EKG)     Past Surgical History  Procedure Laterality Date  . Cataract extraction, bilateral      Family History  Problem Relation Age of Onset  . Lymphoma Mother   . Alzheimer's disease Mother   . Alzheimer's disease Father   . Breast cancer Sister   . Depression Daughter   . Asthma Daughter   . Schizophrenia Daughter   . Thyroid disease Daughter    Social History:  reports that she has never smoked. She has never used smokeless tobacco. She reports that she does not drink alcohol or use illicit drugs.  Allergies:  Allergies  Allergen Reactions  . Lamisil [Terbinafine Hcl]   . Sulfur      (Not in a hospital admission)  Results for orders placed or performed during the  hospital encounter of 03/10/15 (from the past 48 hour(s))  Lactic acid, plasma     Status: None   Collection Time: 03/10/15  8:48 PM  Result Value Ref Range   Lactic Acid, Venous 0.9 0.5 - 2.0 mmol/L  CBC     Status: Abnormal   Collection Time: 03/10/15  8:49 PM  Result Value Ref Range   WBC 7.0 3.6 - 11.0 K/uL   RBC 4.76 3.80 - 5.20 MIL/uL   Hemoglobin 9.4 (L) 12.0 - 16.0 g/dL   HCT 30.5 (L) 35.0 - 47.0 %   MCV 64.2 (L) 80.0 - 100.0 fL   MCH 19.7 (L) 26.0 - 34.0 pg   MCHC 30.7 (L) 32.0 - 36.0 g/dL   RDW 20.5 (H) 11.5 - 14.5 %   Platelets 576 (H) 150 - 440 K/uL  Comprehensive metabolic panel     Status: Abnormal   Collection Time: 03/10/15  8:49 PM  Result Value Ref Range   Sodium 137 135 - 145 mmol/L   Potassium 4.1 3.5 - 5.1 mmol/L   Chloride 103 101 - 111 mmol/L   CO2 27 22 - 32 mmol/L   Glucose, Bld 97 65 - 99 mg/dL   BUN 13 6 - 20 mg/dL   Creatinine, Ser 0.80 0.44 - 1.00 mg/dL   Calcium 9.0 8.9 - 10.3 mg/dL   Total Protein 6.3 (L) 6.5 - 8.1 g/dL  Albumin 3.1 (L) 3.5 - 5.0 g/dL   AST 14 (L) 15 - 41 U/L   ALT 12 (L) 14 - 54 U/L   Alkaline Phosphatase 85 38 - 126 U/L   Total Bilirubin 0.3 0.3 - 1.2 mg/dL   GFR calc non Af Amer >60 >60 mL/min   GFR calc Af Amer >60 >60 mL/min    Comment: (NOTE) The eGFR has been calculated using the CKD EPI equation. This calculation has not been validated in all clinical situations. eGFR's persistently <60 mL/min signify possible Chronic Kidney Disease.    Anion gap 7 5 - 15   Ct Abdomen Pelvis W Contrast  03/10/2015  CLINICAL DATA:  Intermittent abdominal pain and diminished p.o. intake. Discharge from hospital yesterday. EXAM: CT ABDOMEN AND PELVIS WITH CONTRAST TECHNIQUE: Multidetector CT imaging of the abdomen and pelvis was performed using the standard protocol following bolus administration of intravenous contrast. CONTRAST:  50m OMNIPAQUE IOHEXOL 300 MG/ML  SOLN COMPARISON:  CT 4 days prior 03/06/2015 FINDINGS: Lower chest: Mild  scarring at the lung bases, right greater than left. Mild cardiomegaly. Liver: No focal hepatic lesion. Hepatobiliary: Gallbladder physiologically distended, no calcified stone. No biliary dilatation. Pancreas: No ductal dilatation or surrounding inflammation. Spleen: Upper limits normal in size. Adrenal glands: No nodule. Kidneys: Unchanged bilateral hydronephrosis, moderate to severe on the right and mild on the left. No definite ureteral dilatation. Excreted contrast remains in the renal collecting systems on delayed phase imaging. Stomach/Bowel: Small hiatal hernia. Stomach physiologically distended. Enteric contrast throughout prominent small bowel loops. Colonic intussusception again seen, with transition point now in the region of the distal transverse. This does appear slightly progressed from prior. Intussusception contains mesentery and a long colonic segment. No pneumatosis. Splenic flexure and descending colon contain contrast, likely from prior CT. There is rectal wall thickening. Vascular/Lymphatic: No retroperitoneal adenopathy. Abdominal aorta is normal in caliber. Moderate atherosclerosis without aneurysm. Reproductive: Atrophic uterus nor ovaries, normal for age. Bladder: Physiologically distended. Other: Small amount of free fluid in the right pericolic gutter and upper abdomen, new from prior. No free air. Musculoskeletal: Compression deformity of L3 is again seen. There are no acute or suspicious osseous abnormalities. IMPRESSION: 1. Persistent colonic intussusception, with mild progress in from prior CT. Intussusception extends to the distal transverse colon. Small amount of free fluid is a new, no perforation or free air. In adults, colonic intussusception is suspicious for lead point and potential malignancy. Surgical consultation and/or colonoscopy recommended. 2. Severe right hydronephrosis, mild left hydronephrosis. This appears unchanged from prior. These results were called by telephone  at the time of interpretation on 03/10/2015 at 11:24 pm to Dr. AEula Listen, who verbally acknowledged these results. Electronically Signed   By: MJeb LeveringM.D.   On: 03/10/2015 23:24    Review of Systems  Unable to perform ROS: dementia    Blood pressure 195/63, pulse 60, temperature 97.6 F (36.4 C), temperature source Oral, resp. rate 16, SpO2 99 %. Physical Exam  Constitutional: She appears cachectic. She is cooperative. She does not have a sickly appearance. She does not appear ill. No distress.  HENT:  Head: Normocephalic and atraumatic.  Eyes: Pupils are equal, round, and reactive to light.  Cardiovascular: Normal rate and regular rhythm.   Respiratory: Effort normal and breath sounds normal. No respiratory distress.  GI: Soft. She exhibits distension. She exhibits no mass. There is tenderness. There is no rebound and no guarding.  Neurological: She is alert.  Skin: Skin is  warm. She is not diaphoretic.     Assessment/Plan 82 y/o female with ileocolonic intusussception of unknown duration- at least 5 days. Dementia Right sided hydronephrosis., mild left sided hydronephrosis. Cr 0.80   Plan:  Admit, npo, will need surgery Will discuss with Dr Adonis Huguenin at shift change.  Sherri Rad MD FACS [facility]. 03/11/2015, 12:24 AM

## 2015-03-11 NOTE — Progress Notes (Signed)
CC: Intussusception Subjective: Patient readmitted overnight due to recurrence of symptoms from her ileocolonic intussusception. Patient sitting in bed at my visit today but does not interact with me vocally at all.  Objective: Vital signs in last 24 hours: Temp:  [97.4 F (36.3 C)-98.3 F (36.8 C)] 98.3 F (36.8 C) (01/05 1324) Pulse Rate:  [52-66] 56 (01/05 1324) Resp:  [16-20] 16 (01/05 1324) BP: (160-199)/(40-65) 161/52 mmHg (01/05 1324) SpO2:  [97 %-100 %] 97 % (01/05 1324) Weight:  [38.057 kg (83 lb 14.4 oz)] 38.057 kg (83 lb 14.4 oz) (01/05 0115) Last BM Date: 03/10/15  Intake/Output from previous day: 01/04 0701 - 01/05 0700 In: 375 [I.V.:375] Out: 400 [Urine:400] Intake/Output this shift: Total I/O In: 282 [I.V.:282] Out: 350 [Urine:350]  Physical exam:  Gen.: No acute distress with a flat affect Chest: Clear to auscultation Heart: Regular rhythm Abdomen: Soft, mildly distended, questionably tender on deep palpation in the right lower quadrant  Lab Results: CBC   Recent Labs  03/10/15 2049  WBC 7.0  HGB 9.4*  HCT 30.5*  PLT 576*   BMET  Recent Labs  03/10/15 2049 03/11/15 0326  NA 137 135  K 4.1 4.0  CL 103 104  CO2 27 28  GLUCOSE 97 106*  BUN 13 10  CREATININE 0.80 0.82  CALCIUM 9.0 8.5*   PT/INR No results for input(s): LABPROT, INR in the last 72 hours. ABG No results for input(s): PHART, HCO3 in the last 72 hours.  Invalid input(s): PCO2, PO2  Studies/Results: Ct Abdomen Pelvis W Contrast  03/10/2015  CLINICAL DATA:  Intermittent abdominal pain and diminished p.o. intake. Discharge from hospital yesterday. EXAM: CT ABDOMEN AND PELVIS WITH CONTRAST TECHNIQUE: Multidetector CT imaging of the abdomen and pelvis was performed using the standard protocol following bolus administration of intravenous contrast. CONTRAST:  29mL OMNIPAQUE IOHEXOL 300 MG/ML  SOLN COMPARISON:  CT 4 days prior 03/06/2015 FINDINGS: Lower chest: Mild scarring at the  lung bases, right greater than left. Mild cardiomegaly. Liver: No focal hepatic lesion. Hepatobiliary: Gallbladder physiologically distended, no calcified stone. No biliary dilatation. Pancreas: No ductal dilatation or surrounding inflammation. Spleen: Upper limits normal in size. Adrenal glands: No nodule. Kidneys: Unchanged bilateral hydronephrosis, moderate to severe on the right and mild on the left. No definite ureteral dilatation. Excreted contrast remains in the renal collecting systems on delayed phase imaging. Stomach/Bowel: Small hiatal hernia. Stomach physiologically distended. Enteric contrast throughout prominent small bowel loops. Colonic intussusception again seen, with transition point now in the region of the distal transverse. This does appear slightly progressed from prior. Intussusception contains mesentery and a long colonic segment. No pneumatosis. Splenic flexure and descending colon contain contrast, likely from prior CT. There is rectal wall thickening. Vascular/Lymphatic: No retroperitoneal adenopathy. Abdominal aorta is normal in caliber. Moderate atherosclerosis without aneurysm. Reproductive: Atrophic uterus nor ovaries, normal for age. Bladder: Physiologically distended. Other: Small amount of free fluid in the right pericolic gutter and upper abdomen, new from prior. No free air. Musculoskeletal: Compression deformity of L3 is again seen. There are no acute or suspicious osseous abnormalities. IMPRESSION: 1. Persistent colonic intussusception, with mild progress in from prior CT. Intussusception extends to the distal transverse colon. Small amount of free fluid is a new, no perforation or free air. In adults, colonic intussusception is suspicious for lead point and potential malignancy. Surgical consultation and/or colonoscopy recommended. 2. Severe right hydronephrosis, mild left hydronephrosis. This appears unchanged from prior. These results were called by telephone at the time  of  interpretation on 03/10/2015 at 11:24 pm to Dr. Eula Listen , who verbally acknowledged these results. Electronically Signed   By: Jeb Levering M.D.   On: 03/10/2015 23:24    Anti-infectives: Anti-infectives    None      Assessment/Plan:  82 year old female that is pleasantly demented. Readmitted secondary to symptoms from an ileocolonic intussusception. Had a long conversation with the family who are at the bedside about her going to do about this. Family is not sure how far they wished to pursue any operative intervention. After a long conversation about her treatment options the plan was made to allow the patient have a clear liquid diet. If she can tolerate a clear liquid diet without any nausea or vomiting or uncontrolled abdominal pain will then obtain a palliative care consult tomorrow. The patient is unable to participate in any of the plans for her treatment.  Kylar Speelman T. Adonis Huguenin, MD, FACS  03/11/2015

## 2015-03-12 ENCOUNTER — Telehealth: Payer: Self-pay | Admitting: Family Medicine

## 2015-03-12 DIAGNOSIS — R1310 Dysphagia, unspecified: Secondary | ICD-10-CM

## 2015-03-12 NOTE — Progress Notes (Signed)
CC: Intussusception Subjective: 82 year old female admitted for chronic intussusception. Had an uneventful night. Patient is able tolerate a clear liquid diet without any nausea or vomiting. Patient also had some evidence of bowel function. There was some question of abdominal pain however this did not stop her from eating or drinking.  Objective: Vital signs in last 24 hours: Temp:  [97.7 F (36.5 C)-98.3 F (36.8 C)] 97.7 F (36.5 C) (01/06 0538) Pulse Rate:  [55-60] 55 (01/06 0538) Resp:  [16-17] 17 (01/06 0538) BP: (161-180)/(52-78) 170/75 mmHg (01/06 0538) SpO2:  [97 %-100 %] 100 % (01/06 0538) Weight:  [38.374 kg (84 lb 9.6 oz)] 38.374 kg (84 lb 9.6 oz) (01/06 0500) Last BM Date: 03/10/15  Intake/Output from previous day: 01/05 0701 - 01/06 0700 In: 722 [P.O.:440; I.V.:282] Out: 1750 [Urine:1750] Intake/Output this shift: Total I/O In: 1506 [I.V.:1506] Out: -   Physical exam:  Gen.: No acute distress but obviously confused Chest: Clear to auscultation Heart: Regular rate and rhythm Abdomen: Soft, mild distended, nontender with active bowel sounds.  Lab Results: CBC   Recent Labs  03/10/15 2049  WBC 7.0  HGB 9.4*  HCT 30.5*  PLT 576*   BMET  Recent Labs  03/10/15 2049 03/11/15 0326  NA 137 135  K 4.1 4.0  CL 103 104  CO2 27 28  GLUCOSE 97 106*  BUN 13 10  CREATININE 0.80 0.82  CALCIUM 9.0 8.5*   PT/INR No results for input(s): LABPROT, INR in the last 72 hours. ABG No results for input(s): PHART, HCO3 in the last 72 hours.  Invalid input(s): PCO2, PO2  Studies/Results: Ct Abdomen Pelvis W Contrast  03/10/2015  CLINICAL DATA:  Intermittent abdominal pain and diminished p.o. intake. Discharge from hospital yesterday. EXAM: CT ABDOMEN AND PELVIS WITH CONTRAST TECHNIQUE: Multidetector CT imaging of the abdomen and pelvis was performed using the standard protocol following bolus administration of intravenous contrast. CONTRAST:  68mL OMNIPAQUE  IOHEXOL 300 MG/ML  SOLN COMPARISON:  CT 4 days prior 03/06/2015 FINDINGS: Lower chest: Mild scarring at the lung bases, right greater than left. Mild cardiomegaly. Liver: No focal hepatic lesion. Hepatobiliary: Gallbladder physiologically distended, no calcified stone. No biliary dilatation. Pancreas: No ductal dilatation or surrounding inflammation. Spleen: Upper limits normal in size. Adrenal glands: No nodule. Kidneys: Unchanged bilateral hydronephrosis, moderate to severe on the right and mild on the left. No definite ureteral dilatation. Excreted contrast remains in the renal collecting systems on delayed phase imaging. Stomach/Bowel: Small hiatal hernia. Stomach physiologically distended. Enteric contrast throughout prominent small bowel loops. Colonic intussusception again seen, with transition point now in the region of the distal transverse. This does appear slightly progressed from prior. Intussusception contains mesentery and a long colonic segment. No pneumatosis. Splenic flexure and descending colon contain contrast, likely from prior CT. There is rectal wall thickening. Vascular/Lymphatic: No retroperitoneal adenopathy. Abdominal aorta is normal in caliber. Moderate atherosclerosis without aneurysm. Reproductive: Atrophic uterus nor ovaries, normal for age. Bladder: Physiologically distended. Other: Small amount of free fluid in the right pericolic gutter and upper abdomen, new from prior. No free air. Musculoskeletal: Compression deformity of L3 is again seen. There are no acute or suspicious osseous abnormalities. IMPRESSION: 1. Persistent colonic intussusception, with mild progress in from prior CT. Intussusception extends to the distal transverse colon. Small amount of free fluid is a new, no perforation or free air. In adults, colonic intussusception is suspicious for lead point and potential malignancy. Surgical consultation and/or colonoscopy recommended. 2. Severe right hydronephrosis, mild  left hydronephrosis. This appears unchanged from prior. These results were called by telephone at the time of interpretation on 03/10/2015 at 11:24 pm to Dr. Eula Listen , who verbally acknowledged these results. Electronically Signed   By: Jeb Levering M.D.   On: 03/10/2015 23:24    Anti-infectives: Anti-infectives    None      Assessment/Plan:  82 year old female with a chronic ileocolonic intussusception. In the setting of her dementia and overall poor physical function it was discussed with the family yesterday the patient could tolerate even a liquid diet without nausea and vomiting that we would plan for a nonoperative approach with the assistance of palliative care. Patient tolerated diet over the last 24 hours and a consult from [facility] of care was placed today. No current plans for operative intervention in this patient.  Luismiguel Lamere T. Adonis Huguenin, MD, FACS  03/12/2015

## 2015-03-12 NOTE — Progress Notes (Signed)
Palliative Care Update   I have reviewed the chart and examined pt.   I have met briefly with daughter, Mel Almond.  I have spoken briefly on the phone with daughter, Otila Kluver (who lives about 1.5 hrs drive time away near [place]).  Otila Kluver and I will talk later when Mel Almond is not nearby listening to conversation per request of daughter Otila Kluver.  Otila Kluver states she is 'something like a HCPOA' though not HCPOA.  We do not have a copy of that form but she says she can fax it here On Monday.  I brought up code status and Hospice. We talked briefly about [facility].  Pt is quite demented and [place] not qualify for Home Health PT etc.   Pt is expected to be here over the weekend reportedly (based on review of notes).  I will talk with daughter, Otila Kluver, by phone this evening or tomorrow.  Then will plan on seeing pt and discussing pt with care team and care mgr, etc.    I was not able to get any changes in code status during this one conversation with Otila Kluver but she says she would be glad to discuss it further when I call her later on.  Full note to follow.  Colleen Can, MD

## 2015-03-12 NOTE — Telephone Encounter (Signed)
Daugther Mel Almond) wanted to inform you that her mom went home from the hospital for one day and had to go back due to her twisted bowl getting worse. They did a seconde CT scan and she has colon cancer. She will be going home Monday if not sooner. She would like for you to read her mom hospital notes.

## 2015-03-13 ENCOUNTER — Inpatient Hospital Stay: Payer: Medicare Other

## 2015-03-13 LAB — URINE CULTURE: Culture: NO GROWTH

## 2015-03-13 NOTE — Progress Notes (Signed)
Dr. Burt Knack notified of chest pain. EKG and chest x ray ordered per MD orders. Continue to monitor.  Almedia Balls, RN

## 2015-03-13 NOTE — Consult Note (Signed)
Palliative Medicine Inpatient Consult Note   Name: Tammy Henry Date: 03/13/2015 MRN: [id number]  DOB: June 17, 1929  Referring Physician: Clayburn Pert, MD  Palliative Care consult requested for this 82 y.o. female for goals of medical therapy in patient with dementia and bewel intussusseption.  She had a recent admission for the same problem and went home and was well for a day but then worsened and was readmitted under surgical management.  No surgery is planned, however as conservative management is deemed to be most appropriate by her attending.   TODAY'S DISCUSSIONS AND DECISIONS: I have reviewed the chart and examined pt.   I have met briefly with daughter, Mel Almond. I have spoken briefly on the phone with daughter, Otila Kluver (who lives about 1.5 hrs drive time away near [place]). Otila Kluver and I will talk later when Mel Almond is not nearby listening to conversation per request of daughter Otila Kluver. Otila Kluver states she is 'something like a HCPOA' though not HCPOA. We do not have a copy of that form but she says she can fax it here On Monday.  I brought up code status and Hospice. We talked briefly about [facility]. Pt is quite demented and [place] not qualify for Home Health PT etc.   Pt is expected to be here over the weekend reportedly (based on review of notes). I will talk with daughter, Otila Kluver, by phone this evening or tomorrow. Then will plan on seeing pt and discussing pt with care team and care mgr, etc.   I was not able to get any changes in code status during this one conversation with Otila Kluver but she says she would be glad to discuss it further when I call her later on.  IMPRESSION: Chronic ileocolonic intussusception. 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 (has h/o CVA) HTN with some elevated systolic blood  pressure readings Malnutrition related to advancing dementia and chronic illess Acute kidney failure due to dehydration -resolved Anxiety and depression Hypothyroidism H/O UTI's Degenerative spine Iron Deficiency Anemia H/O functional diarrhea, Psoriasis Dependent edema Osteoporosis Known Chronic Severe Right sided Hydronephrosis ----and milder left sided hydronephrosis ----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  SOCIAL HISTORY:  reports that she has never smoked. She has never used smokeless tobacco. She reports that she does not drink alcohol or use illicit drugs.  Lives with one daughter, Mel Almond.   However, daughter, Otila Kluver [phone]) is the decision-maker.   LEGAL DOCUMENTS:  Daughter who lives out of town states she will fax in the document that give her health care decision-making role for pt. She will fax it here Monday, Jan 7.   CODE STATUS: Full code at this time. To be discussed further.   PAST MEDICAL HISTORY: Past Medical History  Diagnosis Date  . Other frontotemporal dementia   . Acute kidney failure, unspecified ([place])   . Osteoporosis, unspecified   . Essential hypertension, benign   . Depressive disorder, not elsewhere classified   . Edema   . Other psoriasis   . Unspecified venous (peripheral) insufficiency   . Anxiety state, unspecified   . Unspecified hypothyroidism   .  Functional diarrhea   . Unspecified pruritic disorder   . Other specified disorders of shoulder joint   . Cachexia ([place])   . Dehydration   . Urinary tract infection, site not specified   . Other and unspecified disc disorder of cervical region   . Early satiety   . Iron deficiency anemia,  unspecified   . Nonspecific abnormal electrocardiogram (ECG) (EKG)     PAST SURGICAL HISTORY:  Past Surgical History  Procedure Laterality Date  . Cataract extraction, bilateral      ALLERGIES:  is allergic to lamisil and sulfur.  MEDICATIONS:  Current Facility-Administered Medications  Medication Dose Route Frequency Provider Last Rate Last Dose  . acetaminophen (TYLENOL) tablet 650 mg  650 mg Oral Q6H PRN Sherri Rad, MD   650 mg at 03/11/15 9604   Or  . acetaminophen (TYLENOL) suppository 650 mg  650 mg Rectal Q6H PRN Sherri Rad, MD      . amLODipine (NORVASC) tablet 5 mg  5 mg Oral Daily Sherri Rad, MD   5 mg at 03/13/15 0955  . antiseptic oral rinse (CPC / CETYLPYRIDINIUM CHLORIDE 0.05%) solution 7 mL  7 mL Mouth Rinse q12n4p Sherri Rad, MD   7 mL at 03/11/15 1645  . dextrose 5 % and 0.45 % NaCl with KCl 20 mEq/L infusion   Intravenous Continuous Sherri Rad, MD 100 mL/hr at 03/13/15 0513    . diphenhydrAMINE (BENADRYL) injection 12.5 mg  12.5 mg Intravenous Q6H PRN Clayburn Pert, MD   12.5 mg at 03/13/15 1144  . enoxaparin (LOVENOX) injection 30 mg  30 mg Subcutaneous Daily Sherri Rad, MD   30 mg at 03/13/15 0955  . feeding supplement (ENSURE ENLIVE) (ENSURE ENLIVE) liquid 237 mL  237 mL Oral BID BM Florene Glen, MD   237 mL at 03/13/15 1400  . levothyroxine (SYNTHROID, LEVOTHROID) tablet 75 mcg  75 mcg Oral QAC breakfast Sherri Rad, MD   75 mcg at 03/13/15 [phone]  . morphine 2 MG/ML injection 1 mg  1 mg Intravenous Q2H PRN Sherri Rad, MD   1 mg at 03/12/15 1734  . ondansetron (ZOFRAN) tablet 4 mg  4 mg Oral Q6H PRN Sherri Rad, MD       Or  . ondansetron [facility]) injection 4 mg  4 mg Intravenous Q6H PRN Sherri Rad, MD   4 mg at 03/12/15 0707  . phenylephrine ((USE for PREPARATION-H)) 0.25 % suppository 1 suppository  1 suppository Rectal BID Clayburn Pert, MD   1 suppository at 03/13/15 1000    Vital Signs: BP 134/55 mmHg  Pulse 64  Temp(Src) 99 F (37.2 C) (Oral)  Resp 19  Ht  5' 4"  (1.626 m)  Wt 37.558 kg (82 lb 12.8 oz)  BMI 14.21 kg/m2  SpO2 100% Filed Weights   03/11/15 0115 03/12/15 0500 03/13/15 0500  Weight: 38.057 kg (83 lb 14.4 oz) 38.374 kg (84 lb 9.6 oz) 37.558 kg (82 lb 12.8 oz)    Estimated body mass index is 14.21 kg/(m^2) as calculated from the following:   Height as of this encounter: 5' 4"  (1.626 m).   Weight as of this encounter: 37.558 kg (82 lb 12.8 oz).  PERFORMANCE STATUS (ECOG) : 4 - Bedbound  PHYSICAL EXAM: Lying on her right side with head resting on bed rail ( repositioned her with a pillow under her head) In med-surg bed. Alert and confused and confabulating (able to speak some of her needs intelligibly) EOMI Cachectic with bilateral temporal wasting noted  Nares patent Op clear No JVD or TM Hrt rrr no m Lungs cta no rales Abd has quiet soft BS and is nontender Ext no cyanosis or mottling.  Skin is warm and dry  LABS: CBC:    Component Value Date/Time   WBC 7.0 03/10/2015 2049   WBC 7.8 02/24/2015 1640   WBC 9.4 12/21/2013 0419   HGB 9.4* 03/10/2015 2049   HGB 7.7* 12/21/2013 0419   HCT 30.5* 03/10/2015 2049   HCT 30.3* 02/24/2015 1640   HCT 25.2* 12/21/2013 0419   PLT 576* 03/10/2015 2049   PLT 541* 02/24/2015 1640   PLT 381 12/21/2013 0419   MCV 64.2* 03/10/2015 2049   MCV 68* 02/24/2015 1640   MCV 71* 12/21/2013 0419   NEUTROABS 5.5 02/24/2015 1640   NEUTROABS 4.2 09/24/2014 1450   NEUTROABS 7.6* 12/21/2013 0419   LYMPHSABS 0.7 02/24/2015 1640   LYMPHSABS 0.7* 09/24/2014 1450   LYMPHSABS 0.6* 12/21/2013 0419   MONOABS 0.4 09/24/2014 1450   MONOABS 0.9 12/21/2013 0419   EOSABS 0.7* 02/24/2015 1640   EOSABS 0.8* 09/24/2014 1450   EOSABS 0.3 12/21/2013 0419   BASOSABS 0.1 02/24/2015 1640   BASOSABS 0.1 09/24/2014 1450   BASOSABS 0.0 12/21/2013 0419   Comprehensive Metabolic Panel:    Component Value Date/Time   NA 135 03/11/2015 0326   NA 141 02/24/2015 1640   NA 144 12/18/2013 0408   K 4.0  03/11/2015 0326   K 4.0 12/18/2013 0408   CL 104 03/11/2015 0326   CL 112* 12/18/2013 0408   CO2 28 03/11/2015 0326   CO2 25 12/18/2013 0408   BUN 10 03/11/2015 0326   BUN 23 02/24/2015 1640   BUN 19* 12/18/2013 0408   CREATININE 0.82 03/11/2015 0326   CREATININE 1.08 12/18/2013 0408   GLUCOSE 106* 03/11/2015 0326   GLUCOSE 60* 02/24/2015 1640   GLUCOSE 86 12/18/2013 0408   CALCIUM 8.5* 03/11/2015 0326   CALCIUM 8.1* 12/18/2013 0408   AST 14* 03/10/2015 2049   AST 23 12/17/2013 1432   ALT 12* 03/10/2015 2049   ALT 18 12/17/2013 1432   ALKPHOS 85 03/10/2015 2049   ALKPHOS 92 12/17/2013 1432   BILITOT 0.3 03/10/2015 2049   BILITOT <0.2 02/24/2015 1640   BILITOT 0.3 12/17/2013 1432   PROT 6.3* 03/10/2015 2049   PROT 6.2 02/24/2015 1640   PROT 6.5 12/17/2013 1432   ALBUMIN 3.1* 03/10/2015 2049   ALBUMIN 3.6 02/24/2015 1640   ALBUMIN 3.0* 12/17/2013 1432   TESTS: CT 03/06/15: 1. Long segment intussusception involving the cecum, ascending colon and transverse colon to the level of the mid transverse colon. Despite this finding, there does not appear to be overt bowel obstruction at this time. Additionally, no signs of frank perforation are noted at this time. Surgical consultation is strongly recommended. 2. Moderate to severe right and mild left hydronephrosis. No ureteral dilatation. These findings Pollet suggest bilaterally UPJ obstruction. 3. 4 mm nonobstructive calculus in the lower pole collecting system of the right kidney. No ureteral stones. 4. Colonic diverticulosis 5. Extensive atherosclerosis, including left main and 2 vessel coronary artery disease. 6. Additional incidental findings   CT A/P 03/10/15:  Small hiatal hernia. Stomach physiologically distended. Enteric contrast throughout prominent small bowel loops. Colonic intussusception again seen, with transition point now in the region of the distal transverse. This does appear slightly progressed from  prior. Intussusception contains mesentery and a long colonic segment. Compression deformity of L3 is again seen. There are no  acute or suspicious osseous abnormalities. Impression: 1. Persistent colonic intussusception, with mild progress in from prior CT. Intussusception extends to the distal transverse colon. Small amount of free fluid is a new, no perforation or free air. In adults, colonic intussusception is suspicious for lead point and potential malignancy. Surgical consultation and/or colonoscopy recommended. 2. Severe right hydronephrosis, mild left hydronephrosis. This appears unchanged from prior.  More than 50% of the visit was spent in counseling/coordination of care: Yes  Time Spent: 55 minutes

## 2015-03-13 NOTE — Progress Notes (Signed)
CC: Abdominal pain Subjective: Patient remains pleasantly confused this morning. Per daughter patient has tolerated all the diabetic brought for this far without any nausea, vomiting. She has had some mild abdominal pain that was treated with when necessary medications with resolution. Patient states she would like something more substantial to eat. Has been passing flatus.  Objective: Vital signs in last 24 hours: Temp:  [97.6 F (36.4 C)-97.7 F (36.5 C)] 97.6 F (36.4 C) (01/07 0517) Pulse Rate:  [56-64] 60 (01/07 0754) Resp:  [16-20] 20 (01/07 0517) BP: (139-170)/(48-59) 139/48 mmHg (01/07 0754) SpO2:  [98 %-100 %] 99 % (01/07 0754) Weight:  [37.558 kg (82 lb 12.8 oz)] 37.558 kg (82 lb 12.8 oz) (01/07 0500) Last BM Date: 03/11/15  Intake/Output from previous day: 01/06 0701 - 01/07 0700 In: 4177.3 [P.O.:480; I.V.:3697.3] Out: 2200 [Urine:2200] Intake/Output this shift: Total I/O In: -  Out: 525 [Urine:525]  Physical exam:  Gen.: No acute distress Chest: Clear to auscultation Heart: Regular rate and rhythm Abdomen: Soft, nontender, mildly distended  Lab Results: CBC   Recent Labs  03/10/15 2049  WBC 7.0  HGB 9.4*  HCT 30.5*  PLT 576*   BMET  Recent Labs  03/10/15 2049 03/11/15 0326  NA 137 135  K 4.1 4.0  CL 103 104  CO2 27 28  GLUCOSE 97 106*  BUN 13 10  CREATININE 0.80 0.82  CALCIUM 9.0 8.5*   PT/INR No results for input(s): LABPROT, INR in the last 72 hours. ABG No results for input(s): PHART, HCO3 in the last 72 hours.  Invalid input(s): PCO2, PO2  Studies/Results: Dg Chest Port 1 View  03/13/2015  CLINICAL DATA:  Chest pain. EXAM: PORTABLE CHEST 1 VIEW COMPARISON:  Chest radiograph 12/19/2013. FINDINGS: Normal cardiac and mediastinal contours. No large area of pulmonary consolidation. Unchanged calcific densities within the lung apices bilaterally. No large pleural effusion or pneumothorax. IMPRESSION: Unchanged biapical pleural parenchymal  thickening and associated calcific scarring. No acute cardiopulmonary process. Electronically Signed   By: Lovey Newcomer M.D.   On: 03/13/2015 08:26    Anti-infectives: Anti-infectives    None      Assessment/Plan:  82 year old female with a chronic ileocolonic intussusception. Patient is pleasantly demented. Per conversation with the patient's family the decision has been made to do a nonoperative treatment and a palliative care consult was placed. Patient has been tolerating a diet which we will advance this morning. No plans for operative intervention at this time.  Ishaq Maffei T. Adonis Huguenin, MD, FACS  03/13/2015

## 2015-03-13 NOTE — Progress Notes (Signed)
Palliative Care Update  I was called back by pt's daughter, Tammy Henry.  Pt lives with other daughter, Tammy Henry, who has schizophrenia, and does 'ok' but who has lapses in judgement sometimes in dealing with her own compliance with her meds and her mother's meds.  As a result, Tammy Henry, who lives 1.5 hrs away and who works full time and who has a husband who is ill, has hired [facility] to come to the home where Tammy Henry and Tammy Henry live together for 2-3 hrs each evening to make sure both get their meds.  This is stressing finances, but Tammy Henry feels pt wants to be at home with [facility] for as long as she can and she would like to see that this continues as long as it is safe and best to do so.  We talked about a lot of issues including possible short term rehab stay, long term care, home health vs Hospice in the home, etc.    The bottom line is that Tammy Henry would like to have Hospice in the Home for her mother.   I feel that pt qualifies due to the chronic colonic intussusception which is life limiting (likely within 6 months).  She has comorbid conditions of moderate malnutrition, moderately advanced dementia, atherosclerotic heart disease, and bilateral hydronephrosis.    Will ask care mgr to offer 'choice' of hospices for the home setting.    I again spoke about code status.  Tammy Henry will think more about this b/c she says her mother has always answered that she wanted to be 'full code' when she had been asked. But she knows that her mother does not appreciate the nuances of what that can cause to happen if she is brought back physically on a vent and pressors but not brought back mentally, etc.  She will think further about code status.   Kirby Funk, MD Palliative Care

## 2015-03-13 NOTE — Progress Notes (Signed)
Palliative Care Update  I have called pt's daughter, Tammy Henry, (decision-making daughter), and left a message for her.  I am not working today, but had hoped we could talk by phone about goals of care and code status.  I left a message that we should be able to have this conversation on Monday when I return.  I had mentioned possible Hospice in the Home to [place] (when we spoke by phone on Friday evening) ---but there was no detailed discussion about this.  Patient's dementia Smeltz mean that she is not a great candidate for therapy.   Her diagnosis of dementia does not warrant Hospice admission since her dementia is not yet end stage.   However, pt would likely be eligible for Hospice services in the home or another setting given that she has a chronic life-limiting condition with the intussusception of her colon.    This, and code status  can all be addressed further on Monday.    At this time, it is noted from chart review that she is tolerating a diet and passing flatus and seems to be improving/ stabilizing.     Colleen Can, MD Palliative Care

## 2015-03-14 LAB — CBC
HEMATOCRIT: 28.2 % — AB (ref 35.0–47.0)
HEMOGLOBIN: 8.6 g/dL — AB (ref 12.0–16.0)
MCH: 19.4 pg — AB (ref 26.0–34.0)
MCHC: 30.3 g/dL — AB (ref 32.0–36.0)
MCV: 64.1 fL — AB (ref 80.0–100.0)
Platelets: 516 10*3/uL — ABNORMAL HIGH (ref 150–440)
RBC: 4.4 MIL/uL (ref 3.80–5.20)
RDW: 20.1 % — ABNORMAL HIGH (ref 11.5–14.5)
WBC: 6.9 10*3/uL (ref 3.6–11.0)

## 2015-03-14 LAB — ALBUMIN: Albumin: 2.9 g/dL — ABNORMAL LOW (ref 3.5–5.0)

## 2015-03-14 LAB — CREATININE, SERUM
Creatinine, Ser: 0.95 mg/dL (ref 0.44–1.00)
GFR calc non Af Amer: 54 mL/min — ABNORMAL LOW (ref >60)

## 2015-03-14 NOTE — Progress Notes (Signed)
CC: Intussusception Subjective: No acute events overnight. Patient tolerated diet without any nausea, vomiting. Continues to show evidence of bowel function.  Objective: Vital signs in last 24 hours: Temp:  [97.6 F (36.4 C)-98.3 F (36.8 C)] 98.2 F (36.8 C) (01/08 1236) Pulse Rate:  [58-64] 58 (01/08 1236) Resp:  [14-18] 14 (01/08 1236) BP: (133-157)/(37-60) 133/37 mmHg (01/08 1236) SpO2:  [98 %-100 %] 98 % (01/08 1236) Weight:  [37.921 kg (83 lb 9.6 oz)] 37.921 kg (83 lb 9.6 oz) (01/08 0434) Last BM Date: 03/13/15  Intake/Output from previous day: 01/07 0701 - 01/08 0700 In: 2701 [P.O.:480; I.V.:2221] Out: 2725 [Urine:2725] Intake/Output this shift: Total I/O In: 240 [P.O.:240] Out: 900 [Urine:900]  Physical exam:  Gen.: No acute distress Chest: Clear to auscultation Heart: Regular rate and rhythm Abdomen: Soft, nontender, minimally distended.  Lab Results: CBC   Recent Labs  03/14/15 0451  WBC 6.9  HGB 8.6*  HCT 28.2*  PLT 516*   BMET  Recent Labs  03/14/15 0454  CREATININE 0.95   PT/INR No results for input(s): LABPROT, INR in the last 72 hours. ABG No results for input(s): PHART, HCO3 in the last 72 hours.  Invalid input(s): PCO2, PO2  Studies/Results: Dg Chest Port 1 View  03/13/2015  CLINICAL DATA:  Chest pain. EXAM: PORTABLE CHEST 1 VIEW COMPARISON:  Chest radiograph 12/19/2013. FINDINGS: Normal cardiac and mediastinal contours. No large area of pulmonary consolidation. Unchanged calcific densities within the lung apices bilaterally. No large pleural effusion or pneumothorax. IMPRESSION: Unchanged biapical pleural parenchymal thickening and associated calcific scarring. No acute cardiopulmonary process. Electronically Signed   By: Lovey Newcomer M.D.   On: 03/13/2015 08:26    Anti-infectives: Anti-infectives    None      Assessment/Plan:  82 year old female with chronic ileocolonic intussusception. Patient continuing to do well with a  nonoperative approach. Prior care has been counseled. Appreciate their input and assistance. No plan for any operative intervention.  Clemons Salvucci T. Adonis Huguenin, MD, FACS  03/14/2015

## 2015-03-15 DIAGNOSIS — Z8673 Personal history of transient ischemic attack (TIA), and cerebral infarction without residual deficits: Secondary | ICD-10-CM

## 2015-03-15 DIAGNOSIS — K649 Unspecified hemorrhoids: Secondary | ICD-10-CM | POA: Insufficient documentation

## 2015-03-15 DIAGNOSIS — F419 Anxiety disorder, unspecified: Secondary | ICD-10-CM

## 2015-03-15 DIAGNOSIS — E039 Hypothyroidism, unspecified: Secondary | ICD-10-CM

## 2015-03-15 DIAGNOSIS — N1330 Unspecified hydronephrosis: Secondary | ICD-10-CM

## 2015-03-15 DIAGNOSIS — F0150 Vascular dementia without behavioral disturbance: Secondary | ICD-10-CM

## 2015-03-15 DIAGNOSIS — D473 Essential (hemorrhagic) thrombocythemia: Secondary | ICD-10-CM

## 2015-03-15 DIAGNOSIS — E46 Unspecified protein-calorie malnutrition: Secondary | ICD-10-CM

## 2015-03-15 DIAGNOSIS — Z515 Encounter for palliative care: Secondary | ICD-10-CM

## 2015-03-15 DIAGNOSIS — I10 Essential (primary) hypertension: Secondary | ICD-10-CM

## 2015-03-15 DIAGNOSIS — R600 Localized edema: Secondary | ICD-10-CM

## 2015-03-15 DIAGNOSIS — K561 Intussusception: Principal | ICD-10-CM

## 2015-03-15 MED ORDER — HYDROCORTISONE ACETATE 25 MG RE SUPP: 25.0000 mg | Freq: Two times a day (BID) | RECTAL | Status: DC

## 2015-03-15 MED ORDER — HYDROCORTISONE ACETATE 25 MG RE SUPP
25.0000 mg | Freq: Two times a day (BID) | RECTAL | Status: DC
  Administered 2015-03-15 – 2015-03-16 (×3): 25 mg via RECTAL
  Filled 2015-03-15 (×3): qty 1

## 2015-03-15 MED ORDER — PHENYLEPHRINE IN HARD FAT 0.25 % RE SUPP: 1.0000 | Freq: Two times a day (BID) | RECTAL | Status: DC

## 2015-03-15 NOTE — Progress Notes (Signed)
Palliative Care Update  I have called Dr Burt Knack and let him know I have been very much engaged in conversations with the daughter who can make decisions for pt (and that is not the daughter who lives w/ pt or who is in the room w/ pt often).    Plan is for Hospice in the Home.  Pt is malnourished with an albumin less than 3.0.  She has a CHRONIC colon condition (intussusseption from cecum to transverse colon) and a more serious condition such as colon cancer has not been ruled out (though this condition alone is quite life limiting.  Pt is eating now.  Had a soft diet, but due to prolonged time chewing the white bread with her [place] sandwich today, I changed this to dysphagia 3 with thin liquids.  White bread is probably not the best choice in elderly patients given its proclivity for getting gummed up and forming a hard ball that is hard to swallow.  Apparently, the order for hospice choice for in the home hospice services is now being followed up on.  I spoke with care manager, who will call [name], the decision-making daughter.  Full note to follow.  Colleen Can, MD Palliative Care

## 2015-03-15 NOTE — Discharge Instructions (Signed)
Resume activity Soft diet

## 2015-03-15 NOTE — Care Management (Signed)
Patient admitted from home with intusussecption.  Patient has history of dementia.  Lives with daughter Mel Almond.  Daughter Otila Kluver has HPOA.  Plan for patient to discharge home with hospice.  See note from Dr Megan Salon.  I spoke with Daughter and per her request sent her a list of hospice agencies.  Otila Kluver states that she will get back with me by tomorrow at the latest to let me know which agency she has chosen.  Otila Kluver states that she has 3 hours of services from [facility] 7 days a week.  [name] request list of [facility] agencies which I also sent to her.  Patient obtains her medications from [facility] on [address].  RNCM following for discharge planning

## 2015-03-15 NOTE — Care Management Important Message (Signed)
Important Message  Patient Details  Name: Tammy Henry MRN: [id number] Date of Birth: Mar 08, 1929   Medicare Important Message Given:  Yes    Juliann Pulse A Jamille Fisher 03/15/2015, 2:22 PM

## 2015-03-15 NOTE — Progress Notes (Signed)
Nutrition Follow-up  DOCUMENTATION CODES:   Severe malnutrition in context of chronic illness  INTERVENTION:  Meals and snacks: Cater to pt preferences Medical Nutrition Supplement Therapy: continue ensure enlive for added nutrition   NUTRITION DIAGNOSIS:   Inadequate oral intake related to altered GI function as evidenced by NPO status.    GOAL:   Patient will meet greater than or equal to 90% of their needs    MONITOR:    (Energy intake, Digestive system)  REASON FOR ASSESSMENT:   Malnutrition Screening Tool    ASSESSMENT:      Palliative care following.  Possible discharge tomorrow per dtr.    Current Nutrition:dtr reports eating mashed potatoes and carrots.  Meat is tough for pt to chew.  Noted diet order changed to dysphagia 3     Scheduled Medications:  . amLODipine  5 mg Oral Daily  . antiseptic oral rinse  7 mL Mouth Rinse q12n4p  . enoxaparin (LOVENOX) injection  30 mg Subcutaneous Daily  . feeding supplement (ENSURE ENLIVE)  237 mL Oral BID BM  . hydrocortisone  25 mg Rectal BID  . levothyroxine  75 mcg Oral QAC breakfast  . phenylephrine  1 suppository Rectal BID    Continuous Medications:  . dextrose 5 % and 0.45 % NaCl with KCl 20 mEq/L 100 mL/hr at 03/15/15 1029     Electrolyte/Renal Profile and Glucose Profile:   Recent Labs Lab 03/10/15 2049 03/11/15 0326 03/14/15 0454  NA 137 135  --   K 4.1 4.0  --   CL 103 104  --   CO2 27 28  --   BUN 13 10  --   CREATININE 0.80 0.82 0.95  CALCIUM 9.0 8.5*  --   GLUCOSE 97 106*  --    Protein Profile:  Recent Labs Lab 03/10/15 2049 03/14/15 0454  ALBUMIN 3.1* 2.9*    Gastrointestinal Profile: Last BM: 1/09      Diet Order:  DIET DYS 3 Room service appropriate?: Yes with Assist; Fluid consistency:: Thin  Skin:   reviewed     Height:   Ht Readings from Last 1 Encounters:  03/11/15 5\' 4"  (1.626 m)    Weight:   Wt Readings from Last 1 Encounters:  03/15/15 82 lb  6.4 oz (37.376 kg)    Ideal Body Weight:     BMI:  Body mass index is 14.14 kg/(m^2).  Estimated Nutritional Needs:   Kcal:  BEE 990 kcals (IF 1.0-1.3, AF 1.3) [id number] kcals Using IBW of 55kg  Protein:  (1.0-1.2 g/kg) 55-66 g/kg  Fluid:  (25-12ml/kg) 1375-1639ml/d  EDUCATION NEEDS:   No education needs identified at this time  MODERATE Care Level  Kilee Hedding B. Zenia Resides, [place], [place] (pager) Weekend/On-Call pager [phone])

## 2015-03-15 NOTE — Progress Notes (Signed)
CC: Hemorrhoidal pain Subjective: Patient is admitted to the hospital with intussusception which has been chronic in nature and the patient is tolerating a full liquid diet or soft diet at this point. Her daughter is present and helps answer some questions but the patient is lucid and answers questions herself on occasion. Been getting Preparation H for hemorrhoids but has ongoing pain. Eyes nausea vomiting fevers or chills.  Objective: Vital signs in last 24 hours: Temp:  [98.1 F (36.7 C)-98.2 F (36.8 C)] 98.1 F (36.7 C) (01/09 0453) Pulse Rate:  [58-67] 67 (01/09 0453) Resp:  [14-20] 20 (01/09 0453) BP: (133-150)/(37-78) 135/78 mmHg (01/09 0453) SpO2:  [98 %-99 %] 99 % (01/09 0453) Weight:  [82 lb 6.4 oz (37.376 kg)] 82 lb 6.4 oz (37.376 kg) (01/09 0455) Last BM Date: 03/13/15  Intake/Output from previous day: 01/08 0701 - 01/09 0700 In: 2344.9 [P.O.:240; I.V.:2104.9] Out: 2650 [Urine:2650] Intake/Output this shift:    Physical exam:  Cachectic-appearing female patient lying on her side vital signs are reviewed and stable. Abdomen is soft nondistended nontympanitic and nontender. Perianal area shows external hemorrhoidal tags with no thrombosis abs are nontender  Lab Results: CBC   Recent Labs  03/14/15 0451  WBC 6.9  HGB 8.6*  HCT 28.2*  PLT 516*   BMET  Recent Labs  03/14/15 0454  CREATININE 0.95   PT/INR No results for input(s): LABPROT, INR in the last 72 hours. ABG No results for input(s): PHART, HCO3 in the last 72 hours.  Invalid input(s): PCO2, PO2  Studies/Results: No results found.  Anti-infectives: Anti-infectives    None      Assessment/Plan:  Recommended utilizing Anusol HC suppositories for her hemorrhoidal pain at this point. Still awaiting discussion concerning hospice and CODE STATUS. The patient's daughter did not have any new enlightening points on this discussion. Placement soon. No surgical intervention at this point  Florene Glen, MD, FACS  03/15/2015

## 2015-03-16 ENCOUNTER — Telehealth: Payer: Self-pay

## 2015-03-16 ENCOUNTER — Other Ambulatory Visit: Payer: Self-pay

## 2015-03-16 ENCOUNTER — Encounter: Payer: Self-pay | Admitting: Hematology and Oncology

## 2015-03-16 ENCOUNTER — Inpatient Hospital Stay: Payer: Medicare Other

## 2015-03-16 ENCOUNTER — Inpatient Hospital Stay: Payer: Medicare Other | Admitting: Hematology and Oncology

## 2015-03-16 ENCOUNTER — Ambulatory Visit: Payer: Medicare Other | Admitting: Family Medicine

## 2015-03-16 DIAGNOSIS — D509 Iron deficiency anemia, unspecified: Secondary | ICD-10-CM

## 2015-03-16 DIAGNOSIS — K641 Second degree hemorrhoids: Secondary | ICD-10-CM

## 2015-03-16 MED ORDER — PROCHLORPERAZINE 25 MG RE SUPP: 25.0000 mg | Freq: Three times a day (TID) | RECTAL | Status: DC | PRN

## 2015-03-16 MED ORDER — LORAZEPAM 0.5 MG PO TABS: 0.5000 mg | ORAL_TABLET | Freq: Three times a day (TID) | ORAL | Status: DC | PRN

## 2015-03-16 MED ORDER — LORAZEPAM 0.5 MG PO TABS: 0.5000 mg | ORAL_TABLET | Freq: Four times a day (QID) | ORAL | Status: DC | PRN

## 2015-03-16 MED ORDER — ACETAMINOPHEN 325 MG PO TABS: 650.0000 mg | ORAL_TABLET | Freq: Four times a day (QID) | ORAL | Status: AC | PRN

## 2015-03-16 MED ORDER — BISACODYL 10 MG RE SUPP: 10.0000 mg | Freq: Every day | RECTAL | Status: DC | PRN

## 2015-03-16 MED ORDER — ONDANSETRON 4 MG PO TBDP
4.0000 mg | ORAL_TABLET | Freq: Three times a day (TID) | ORAL | Status: DC | PRN
Start: 2015-03-16 — End: 2015-03-16

## 2015-03-16 MED ORDER — MORPHINE SULFATE (CONCENTRATE) 10 MG /0.5 ML PO SOLN
5.0000 mg | Freq: Four times a day (QID) | ORAL | Status: DC | PRN
Start: 2015-03-16 — End: 2015-03-16

## 2015-03-16 MED ORDER — HYDROCORTISONE ACETATE 25 MG RE SUPP: 25.0000 mg | Freq: Two times a day (BID) | RECTAL | Status: DC | PRN

## 2015-03-16 MED ORDER — MORPHINE SULFATE (CONCENTRATE) 10 MG /0.5 ML PO SOLN
5.0000 mg | Freq: Four times a day (QID) | ORAL | Status: DC | PRN
Start: 2015-03-16 — End: 2015-06-30

## 2015-03-16 NOTE — Discharge Summary (Signed)
Physician Discharge Summary  Patient ID: Tammy Henry MRN: [id number] DOB/AGE: 1929-06-09 82 y.o.  Admit date: 03/10/2015 Discharge date: 03/16/2015   Discharge Diagnoses:  Active Problems:   Intussusception of cecum ([place])   Intussusception intestine ([place])   Hemorrhoid   Procedures none  Hospital Course: This patient who presented to the emergency room with abdominal pain that is been going on for several days. A workup showed probable chronic intussusception. This was admitted the hospital and hydrated and discussion concerning the high risk of recurrence was had with the patient and her family members and a palliative care consult was obtained. The patient remains full code the family had decided that surgical intervention was not that the choice that they preferred and that conservative management of this elderly patient was the preferred choice. Patient had her diet advanced to a soft diet and she continues to be able to eat without getting nauseated or having emesis and is passing stool. Nondistended and nontender. Home hospice is being arranged at this point and the patient will go home on home hospice. Will follow up with her primary care physician as needed.  Consults: Palliative care  Disposition: 01-Home or Self Care     Medication List    STOP taking these medications        ciprofloxacin 500 MG tablet  Commonly known as:  CIPRO      TAKE these medications        amLODipine 5 MG tablet  Commonly known as:  NORVASC  Take 1 tablet (5 mg total) by mouth daily.     ferrous sulfate 325 (65 FE) MG tablet  Take 325 mg by mouth daily with breakfast.     hydrocortisone 25 MG suppository  Commonly known as:  ANUSOL-HC  Place 1 suppository (25 mg total) rectally 2 (two) times daily.     levothyroxine 75 MCG tablet  Commonly known as:  SYNTHROID, LEVOTHROID  take 1 tablet by mouth daily     multivitamin with minerals Tabs tablet  Take 1 tablet by mouth daily.     ondansetron 8 MG disintegrating tablet  Commonly known as:  ZOFRAN-ODT  Take 1 tablet (8 mg total) by mouth every 8 (eight) hours as needed for nausea or vomiting.     phenylephrine 0.25 % suppository  Commonly known as:  (USE for PREPARATION-H)  Place 1 suppository rectally 2 (two) times daily.           Follow-up Information    Follow up with Loistine Chance, MD In 2 weeks.   Specialty:  Family Medicine   Why:  If symptoms worsen   Contact information:   [address] [place] [address] [phone]       Florene Glen, MD, FACS

## 2015-03-16 NOTE — Telephone Encounter (Signed)
Per the request of Dr. Steele Sizer, verbal permission for this patient to be discharged into the care of Hopsice was granted to Ivin Booty, the triage nurse with [facility]. Hospice stated they will fax over an order for Dr. Ancil Boozer to sign.

## 2015-03-16 NOTE — Progress Notes (Signed)
CC: Chronic intussusception Subjective: Patient states that she is feeling well and tolerating a diet. She states that her hemorrhoids are better now. No nausea or vomiting no fevers or chills  Objective: Vital signs in last 24 hours: Temp:  [97.4 F (36.3 C)-97.8 F (36.6 C)] 97.8 F (36.6 C) (01/10 0607) Pulse Rate:  [64-68] 64 (01/10 0607) Resp:  [16-18] 17 (01/10 0607) BP: (133-158)/(44-75) 158/53 mmHg (01/10 0607) SpO2:  [98 %-99 %] 98 % (01/10 0607) Weight:  [84 lb 9.6 oz (38.374 kg)] 84 lb 9.6 oz (38.374 kg) (01/10 0500) Last BM Date:  (pt daughter said small BM days ago)  Intake/Output from previous day: 01/09 0701 - 01/10 0700 In: 3939.3 [P.O.:940; I.V.:2999.3] Out: 2590 [Urine:2590] Intake/Output this shift: Total I/O In: 0  Out: 200 [Urine:200]  Physical exam:  Abdomen is soft nondistended nontympanitic and nontender Awake and alert Nontender calves  Lab Results: CBC   Recent Labs  03/14/15 0451  WBC 6.9  HGB 8.6*  HCT 28.2*  PLT 516*   BMET  Recent Labs  03/14/15 0454  CREATININE 0.95   PT/INR No results for input(s): LABPROT, INR in the last 72 hours. ABG No results for input(s): PHART, HCO3 in the last 72 hours.  Invalid input(s): PCO2, PO2  Studies/Results: No results found.  Anti-infectives: Anti-infectives    None      Assessment/Plan:  Patient is tolerating soft diet and will be discharged once home hospice was arranged I will proceed with preparing for discharge.  Florene Glen, MD, FACS  03/16/2015

## 2015-03-16 NOTE — Care Management (Signed)
Spoke with daughter Tammy Henry.  Tammy Henry has selected [facility] to provide services in the home.  Santiago Glad wit hospice notified of referral

## 2015-03-16 NOTE — Progress Notes (Signed)
Pt alert discharge to home with daughter. Gave prescriptions and discharge summary to daughter. IV site removed.

## 2015-03-16 NOTE — Evaluation (Signed)
Physical Therapy Evaluation Patient Details Name: Tammy Henry MRN: [id number] DOB: 02-03-1930 Today's Date: 03/16/2015   History of Present Illness  82 y/o female with dementia, prior CVA and known ileocolonic intussusception; Patient also has severe dementia. She is being managed conservatively at this time and has had Palliative care consule for hospice at home.   Clinical Impression  82 yo Female with dementia presents to hospital with intussusception. Patient has advanced dementia and is not oriented to person, place or situation. She is able to follow single step commands. Patient was needing assistance with all ADLs prior to admittance. She has 4-5 steps to enter house with B rails. Patient has AD at home but refuses to use them and prefers to use HHA with gait tasks. She is supervision for bed mobility, min A for sit<>Stand transfers, min A with gait tasks. Patient ambulates at a slower gait speed and often requires cues for direction but reports minimal fatigue. She was able to ambulate 125 feet with min A. Patient's clinical presentation is stable at this time. She would benefit from additional physical therapy to improve balance/gait safety.     Follow Up Recommendations Home health PT    Equipment Recommendations       Recommendations for Other Services       Precautions / Restrictions Precautions Precautions: Fall Restrictions Weight Bearing Restrictions: No      Mobility  Bed Mobility Overal bed mobility: Needs Assistance Bed Mobility: Supine to Sit     Supine to sit: Supervision     General bed mobility comments: patient able to sit edge of bed however requires supervision for safety;   Transfers Overall transfer level: Needs assistance Equipment used: None Transfers: Sit to/from Stand Sit to Stand: Min assist         General transfer comment: Patient is 1 HHA, min A for sit<>Stand from bed to bedside commode x2 reps; Patient requires min Vcs to get  closer to bed/chair prior to sitting for safety;   Ambulation/Gait Ambulation/Gait assistance: Min assist Ambulation Distance (Feet): 125 Feet Assistive device: None Gait Pattern/deviations: Step-through pattern;Decreased dorsiflexion - right;Decreased dorsiflexion - left;Decreased step length - right;Decreased step length - left;Narrow base of support Gait velocity: decreased   General Gait Details: Patient ambulates with decreased gait speed, forward flexed posture with decreased step length;   Stairs            Wheelchair Mobility    Modified Rankin (Stroke Patients Only)       Balance Overall balance assessment: Needs assistance Sitting-balance support: Single extremity supported Sitting balance-Leahy Scale: Fair Sitting balance - Comments: patient impulsive     Standing balance-Leahy Scale: Fair Standing balance comment: patient requires 1 HHA for standing balance; unable to stand with feet close together due to unsteadiness;                              Pertinent Vitals/Pain Pain Assessment: No/denies pain    Home Living   Living Arrangements: Children Available Help at Discharge: Family Type of Home: House Home Access: Stairs to enter Entrance Stairs-Rails: Right;Can reach Software engineer of Steps: 4-5 Home Layout: Multi-level   Additional Comments: Per daughter, patient has a walker and other DME however she refuses to use it.     Prior Function Level of Independence: Needs assistance   Gait / Transfers Assistance Needed: patient requires 1 HHA for gait tasks.  ADL's / [employer]  Needed: She required assistance with all ADLs including bathing, dressing, and household chores;        Hand Dominance        Extremity/Trunk Assessment   Upper Extremity Assessment: Overall WFL for tasks assessed           Lower Extremity Assessment: Overall WFL for tasks assessed      Cervical / Trunk Assessment:  Kyphotic  Communication   Communication: No difficulties  Cognition Arousal/Alertness: Awake/alert Behavior During Therapy: WFL for tasks assessed/performed Overall Cognitive Status: Within Functional Limits for tasks assessed                      General Comments General comments (skin integrity, edema, etc.): does have some discoloration spots on legs; no wounds;     Exercises        Assessment/Plan    PT Assessment Patient needs continued PT services  PT Diagnosis Difficulty walking;Generalized weakness   PT Problem List Decreased strength;Decreased safety awareness;Decreased balance;Decreased mobility  PT Treatment Interventions Balance training;Gait training;Stair training;Functional mobility training;Therapeutic activities;Therapeutic exercise;Patient/family education   PT Goals (Current goals can be found in the Care Plan section) Acute Rehab PT Goals Patient Stated Goal: "I want to get my teeth cleaned"; daughter reports that she wants her to be able to move around better and safer;  PT Goal Formulation: With patient/family Time For Goal Achievement: 03/30/15 Potential to Achieve Goals: Good    Frequency Min 2X/week   Barriers to discharge Inaccessible home environment has 4-5 steps to enter house;     Co-evaluation               End of Session Equipment Utilized During Treatment: Gait belt Activity Tolerance: Patient tolerated treatment well;Patient limited by fatigue Patient left: in bed;with nursing/sitter in room;with bed alarm set;with family/visitor present Nurse Communication: Mobility status         Time: [id number] PT Time Calculation (min) (ACUTE ONLY): 25 min   Charges:   PT Evaluation $PT Eval Low Complexity: 1 Procedure     PT G Codes:        Henry,Tammy PT, DPT 03/16/2015, 12:10 PM

## 2015-03-16 NOTE — Progress Notes (Signed)
Miscellaneous Note    Following PT eval at 10:20 AM, PT noticed that PT eval and treat order was discontinued. She will not be on PT caseload at this time. If staff want her to be followed, new orders will need to be placed.  thankyou  Hillis Range, PT, DPT, [phone] 03/16/15  12:18 PM

## 2015-03-16 NOTE — Progress Notes (Signed)
Palliative Medicine Inpatient Consult Follow Up Note   Name: Tammy Henry Date: 03/16/2015 MRN: [id number]  DOB: 09/11/1929  Referring Physician: Clayburn Pert, MD  Palliative Care consult requested for this 82 y.o. female for goals of medical therapy in patient with chronic intussusception and malnutrition and dementia.  TODAY'S DISCUSSIONS AND DECISIONS:  I have conveyed the information to Hospice Liaison who is arranging for pt to have [facility] in the home.  I have redone the DC med recs as this was not completed by me yesterday and is needed in order to RX appropriate prn meds for pt at home.  She is likely to do well for short periods of time and then she is likely to have recurrences of her bowel intussusception --with minor and at times major episodes.    Pt remains full code since daughter, Tammy Henry, wants to think about this further.  I discussed it twice with her by phone.    Also, we do not have the HCPOA document I was looking for. But, both daughters are in agreement with current plan-- so this is not a big problem.   Pt needs a hospital bed or would benefit from having one.  Daughter Tammy Henry will need much guidance as she does have a very simple approach to problem solving (due to her schizophrenia).  She is very caring and attentive of her mother and offers her food often etc.   ---------------------------------------------------------------- IMPRESSION: Chronic ileocolonic intussusception. 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 (has h/o CVA) HTN with some elevated systolic blood pressure readings Malnutrition related to advancing dementia and chronic illess Acute kidney failure due to dehydration -resolved Anxiety and depression Hypothyroidism H/O UTI's Degenerative spine Iron Deficiency  Anemia H/O functional diarrhea, Psoriasis Dependent edema Osteoporosis Known Chronic Severe Right sided Hydronephrosis ----and milder left sided hydronephrosis ----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 Other frontotemporal dementia   . Acute kidney failure, unspecified ([place])   . Osteoporosis, unspecified   . Essential hypertension, benign   . Depressive disorder, not elsewhere classified   . Edema   . Other psoriasis   . Unspecified venous (peripheral) insufficiency   . Anxiety state, unspecified   . Unspecified hypothyroidism   . Functional diarrhea   . Unspecified pruritic disorder   . Other specified disorders of shoulder joint   . Cachexia ([place])   . Dehydration   . Urinary tract infection, site not specified   . Other and unspecified disc disorder of cervical region   . Early satiety   . Iron deficiency anemia, unspecified   . Nonspecific abnormal electrocardiogram (ECG) (EKG)     PAST SURGICAL HISTORY:  Past Surgical History  Procedure Laterality Date  . Cataract extraction, bilateral      Vital Signs: BP 148/45 mmHg  Pulse 69  Temp(Src) 98.1 F (36.7 C) (Oral)  Resp 18  Ht 5\' 4"  (1.626 m)  Wt 38.374 kg (84 lb 9.6 oz)  BMI 14.51 kg/m2  SpO2 98% Filed Weights   03/14/15 0434 03/15/15 0455 03/16/15 0500  Weight: 37.921 kg (83 lb 9.6 oz) 37.376 kg (82 lb 6.4 oz) 38.374 kg (84 lb 9.6 oz)    Estimated body mass index is 14.51 kg/(m^2) as calculated from the following:   Height as of this encounter: 5\' 4"  (1.626 m).   Weight as of this encounter: 38.374 kg (84 lb 9.6 oz).  PHYSICAL EXAM: More alert Confabulates but says she feels  OK Hrt rrr no m Lungs cta ant Abd is soft and NT currently Ext cachectic with muscle atrophy Skin no cyanosis or mottling   LABS: CBC:    Component Value Date/Time   WBC 6.9 03/14/2015 0451   WBC 7.8 02/24/2015 1640   WBC 9.4 12/21/2013 0419   HGB 8.6* 03/14/2015 0451   HGB 7.7* 12/21/2013 0419   HCT 28.2* 03/14/2015 0451   HCT 30.3* 02/24/2015 1640   HCT 25.2* 12/21/2013 0419   PLT 516* 03/14/2015 0451   PLT 541* 02/24/2015 1640   PLT 381 12/21/2013 0419   MCV 64.1* 03/14/2015 0451   MCV 68* 02/24/2015 1640   MCV 71* 12/21/2013 0419   NEUTROABS 5.5 02/24/2015 1640   NEUTROABS 4.2 09/24/2014 1450   NEUTROABS 7.6* 12/21/2013 0419   LYMPHSABS 0.7 02/24/2015 1640   LYMPHSABS 0.7* 09/24/2014 1450   LYMPHSABS 0.6* 12/21/2013 0419   MONOABS 0.4 09/24/2014 1450   MONOABS 0.9 12/21/2013 0419   EOSABS 0.7* 02/24/2015 1640   EOSABS 0.8* 09/24/2014 1450   EOSABS 0.3 12/21/2013 0419   BASOSABS 0.1 02/24/2015 1640   BASOSABS 0.1 09/24/2014 1450   BASOSABS 0.0 12/21/2013 0419   Comprehensive Metabolic Panel:    Component Value Date/Time   NA 135 03/11/2015 0326   NA 141 02/24/2015 1640   NA 144 12/18/2013 0408   K 4.0 03/11/2015 0326   K 4.0 12/18/2013 0408   CL 104 03/11/2015 0326   CL 112* 12/18/2013 0408   CO2 28 03/11/2015 0326   CO2 25 12/18/2013 0408   BUN 10 03/11/2015 0326   BUN 23 02/24/2015 1640   BUN 19* 12/18/2013 0408   CREATININE 0.95 03/14/2015 0454   CREATININE 1.08 12/18/2013 0408   GLUCOSE 106* 03/11/2015 0326   GLUCOSE 60* 02/24/2015 1640   GLUCOSE 86 12/18/2013 0408   CALCIUM 8.5* 03/11/2015 0326   CALCIUM 8.1* 12/18/2013 0408   AST 14* 03/10/2015 2049   AST 23 12/17/2013 1432   ALT 12* 03/10/2015 2049   ALT 18 12/17/2013 1432   ALKPHOS 85 03/10/2015 2049   ALKPHOS 92 12/17/2013 1432   BILITOT 0.3 03/10/2015 2049   BILITOT <0.2 02/24/2015 1640   BILITOT 0.3 12/17/2013 1432   PROT 6.3* 03/10/2015 2049   PROT 6.2 02/24/2015 1640   PROT 6.5  12/17/2013 1432   ALBUMIN 2.9* 03/14/2015 0454   ALBUMIN 3.6 02/24/2015 1640   ALBUMIN 3.0* 12/17/2013 1432      More than 50% of the visit was spent in counseling/coordination of care: YES  Time Spent:  35 min

## 2015-03-16 NOTE — Progress Notes (Signed)
Palliative Medicine Inpatient Consult Note   Name: Tammy Henry Date: 03/16/2015 MRN: [id number]  DOB: [birth year]  Referring Physician: Clayburn Pert, MD  Palliative Care consult requested for this 82 y.o. female for goals of medical therapy in patient with known chronic ileocolonic intussusception as well as advanced dementia.     TODAY'S DISCUSSIONS AND DECISIONS:  See notes I placed when I called daughter on Saturday from my home to discuss pt.   I have called Dr Burt Knack and let him know I have been very much engaged in conversations with the daughter who can make decisions for pt (and that is not the daughter who lives w/ pt or who is in the room w/ pt often).   Plan is for Hospice in the Home. Pt is malnourished with an albumin less than 3.0. She has a CHRONIC colon condition (intussusseption from cecum to transverse colon) and a more serious condition such as colon cancer has not been ruled out (though this condition alone is quite life limiting.  Pt is eating now. Had a soft diet, but due to prolonged time chewing the white bread with her [place] sandwich today, I changed this to dysphagia 3 with thin liquids. White bread is probably not the best choice in elderly patients given its proclivity for getting gummed up and forming a hard ball that is hard to swallow.  Apparently, the order for hospice choice for in the home hospice services is now being followed up on. I spoke with care manager, who will call [name], the decision-making daughter.   IMPRESSION: Chronic ileocolonic intussusception. 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 (has h/o CVA) HTN with some elevated systolic blood pressure readings Malnutrition related to advancing dementia and chronic illess Acute kidney failure due to  dehydration -resolved Anxiety and depression Hypothyroidism H/O UTI's Degenerative spine Iron Deficiency Anemia H/O functional diarrhea, Psoriasis Dependent edema Osteoporosis Known Chronic Severe Right sided Hydronephrosis ----and milder left sided hydronephrosis ----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  SOCIAL HISTORY:  reports that she has never smoked. She has never used smokeless tobacco. She reports that she does not drink alcohol or use illicit drugs. She lives with one daughter, Mel Almond.  Mel Almond has schizophrenia and needs support also.  The other daughter, Otila Kluver, is Marine scientist but does not live with pt. Otila Kluver lives 1.5 hrs away and has arranged for both pt and Cora to have [facility] come in and make sure both of them get their meds. When Mel Almond is on her meds, she is a good caregiver --but she cannot make decisions for pt and she misinterprets some things and needs as much support as possible.  If pt is placed somewhere, Mel Almond would not have a place to live. Pt and Cora are very attentive to each other and it is hoped that pt can go home --with HOSPICE in the Home. All of this per [name]'s report.   LEGAL DOCUMENTS:  NONE  CODE STATUS: Full code  PAST MEDICAL HISTORY: Past Medical History  Diagnosis Date  . Other frontotemporal dementia   . Acute kidney failure, unspecified ([place])   . Osteoporosis, unspecified   . Essential hypertension, benign   .  Depressive disorder, not elsewhere classified   . Edema   . Other psoriasis   . Unspecified venous (peripheral) insufficiency   . Anxiety state, unspecified   . Unspecified hypothyroidism   . Functional diarrhea   . Unspecified pruritic disorder   . Other specified disorders of shoulder joint   .  Cachexia ([place])   . Dehydration   . Urinary tract infection, site not specified   . Other and unspecified disc disorder of cervical region   . Early satiety   . Iron deficiency anemia, unspecified   . Nonspecific abnormal electrocardiogram (ECG) (EKG)     PAST SURGICAL HISTORY:  Past Surgical History  Procedure Laterality Date  . Cataract extraction, bilateral      ALLERGIES:  is allergic to lamisil and sulfur.  MEDICATIONS:  Current Facility-Administered Medications  Medication Dose Route Frequency Provider Last Rate Last Dose  . acetaminophen (TYLENOL) tablet 650 mg  650 mg Oral Q6H PRN Sherri Rad, MD   650 mg at 03/11/15 [id number]  . amLODipine (NORVASC) tablet 5 mg  5 mg Oral Daily Sherri Rad, MD   5 mg at 03/16/15 [id number]  . hydrocortisone (ANUSOL-HC) suppository 25 mg  25 mg Rectal BID PRN Colleen Can, MD      . levothyroxine (SYNTHROID, LEVOTHROID) tablet 75 mcg  75 mcg Oral QAC breakfast Sherri Rad, MD   75 mcg at 03/16/15 [id number]    Vital Signs: BP 148/45 mmHg  Pulse 69  Temp(Src) 98.1 F (36.7 C) (Oral)  Resp 18  Ht 5\' 4"  (1.626 m)  Wt 38.374 kg (84 lb 9.6 oz)  BMI 14.51 kg/m2  SpO2 98% Filed Weights   03/14/15 0434 03/15/15 0455 03/16/15 0500  Weight: 37.921 kg (83 lb 9.6 oz) 37.376 kg (82 lb 6.4 oz) 38.374 kg (84 lb 9.6 oz)    Estimated body mass index is 14.51 kg/(m^2) as calculated from the following:   Height as of this encounter: 5\' 4"  (1.626 m).   Weight as of this encounter: 38.374 kg (84 lb 9.6 oz).  PERFORMANCE STATUS (ECOG) : 3 - Symptomatic, >50% confined to bed  PHYSICAL EXAM: Lethargic but opens eyes and says a few words that indicate she is confused EOMI Cachectic Temporal wasting No JVD or TM Hrt rrr no m Lungs with decreased BS bases Ext no cyanosis or mottling     Labs and imaging results all reviewed by me personally today.  Time Spent:   55 minutes

## 2015-03-16 NOTE — Progress Notes (Signed)
New referral for Hospice and [facility] services at home following discharge received from [place] following a Palliative Medicine consult with Dr. Megan Salon.  Ms. Jarema was admitted to [facility] on 1/4 for evaluation of abdominal pain. She was found to have an ileocolonic intussusception. She has not to this point required surgery and has improved with a soft diet. Patient seen sitting up in bed alert, slow to respond, daughter Mel Almond at bedside. Patient appears cachectic, drinking and Ensure. Education initiated regarding hospice services, philosophy and team approach to care with understanding voiced. Questions answered Patient's abdominal pain has been controlled by PRN IV morphine, she has also required PRN zofran for nausea. She denied both during visit. DME in the home includes a walker, wheel chair and BSC, no DME needs identified prior to discharge. Writer also contacted patient's daughter and Christianne Borrow to provide hospice information with good understanding voiced. Otila Kluver has requested that the hospice admission not take place until Thursday as she will be in [place] on that day. Patient remains a full code. Plan is for discharge home via car today. Hospital care  team aware. Discharge summary faxed to referral. Thank you for the opportunity to be involved in the care of this patient. Flo Shanks RN, BSN, [facility] [place], hospital Liaison [phone]

## 2015-03-18 DIAGNOSIS — R64 Cachexia: Secondary | ICD-10-CM | POA: Diagnosis not present

## 2015-03-18 DIAGNOSIS — Z681 Body mass index (BMI) 19 or less, adult: Secondary | ICD-10-CM | POA: Diagnosis not present

## 2015-03-18 DIAGNOSIS — I10 Essential (primary) hypertension: Secondary | ICD-10-CM | POA: Diagnosis not present

## 2015-03-18 DIAGNOSIS — F329 Major depressive disorder, single episode, unspecified: Secondary | ICD-10-CM | POA: Diagnosis not present

## 2015-03-18 DIAGNOSIS — N1330 Unspecified hydronephrosis: Secondary | ICD-10-CM | POA: Diagnosis not present

## 2015-03-18 DIAGNOSIS — F419 Anxiety disorder, unspecified: Secondary | ICD-10-CM | POA: Diagnosis not present

## 2015-03-18 DIAGNOSIS — F0280 Dementia in other diseases classified elsewhere without behavioral disturbance: Secondary | ICD-10-CM | POA: Diagnosis not present

## 2015-03-18 DIAGNOSIS — I2510 Atherosclerotic heart disease of native coronary artery without angina pectoris: Secondary | ICD-10-CM | POA: Diagnosis not present

## 2015-03-18 DIAGNOSIS — Z8673 Personal history of transient ischemic attack (TIA), and cerebral infarction without residual deficits: Secondary | ICD-10-CM | POA: Diagnosis not present

## 2015-03-18 DIAGNOSIS — E440 Moderate protein-calorie malnutrition: Secondary | ICD-10-CM | POA: Diagnosis not present

## 2015-03-18 DIAGNOSIS — G3109 Other frontotemporal dementia: Secondary | ICD-10-CM | POA: Diagnosis not present

## 2015-03-18 DIAGNOSIS — K561 Intussusception: Secondary | ICD-10-CM | POA: Diagnosis not present

## 2015-03-18 DIAGNOSIS — N179 Acute kidney failure, unspecified: Secondary | ICD-10-CM | POA: Diagnosis not present

## 2015-03-18 DIAGNOSIS — I872 Venous insufficiency (chronic) (peripheral): Secondary | ICD-10-CM | POA: Diagnosis not present

## 2015-03-18 DIAGNOSIS — M810 Age-related osteoporosis without current pathological fracture: Secondary | ICD-10-CM | POA: Diagnosis not present

## 2015-03-19 DIAGNOSIS — N179 Acute kidney failure, unspecified: Secondary | ICD-10-CM | POA: Diagnosis not present

## 2015-03-19 DIAGNOSIS — I2510 Atherosclerotic heart disease of native coronary artery without angina pectoris: Secondary | ICD-10-CM | POA: Diagnosis not present

## 2015-03-19 DIAGNOSIS — E440 Moderate protein-calorie malnutrition: Secondary | ICD-10-CM | POA: Diagnosis not present

## 2015-03-19 DIAGNOSIS — R64 Cachexia: Secondary | ICD-10-CM | POA: Diagnosis not present

## 2015-03-19 DIAGNOSIS — K561 Intussusception: Secondary | ICD-10-CM | POA: Diagnosis not present

## 2015-03-19 DIAGNOSIS — N1330 Unspecified hydronephrosis: Secondary | ICD-10-CM | POA: Diagnosis not present

## 2015-03-23 DIAGNOSIS — E440 Moderate protein-calorie malnutrition: Secondary | ICD-10-CM | POA: Diagnosis not present

## 2015-03-23 DIAGNOSIS — N179 Acute kidney failure, unspecified: Secondary | ICD-10-CM | POA: Diagnosis not present

## 2015-03-23 DIAGNOSIS — N1330 Unspecified hydronephrosis: Secondary | ICD-10-CM | POA: Diagnosis not present

## 2015-03-23 DIAGNOSIS — K561 Intussusception: Secondary | ICD-10-CM | POA: Diagnosis not present

## 2015-03-23 DIAGNOSIS — R64 Cachexia: Secondary | ICD-10-CM | POA: Diagnosis not present

## 2015-03-23 DIAGNOSIS — I2510 Atherosclerotic heart disease of native coronary artery without angina pectoris: Secondary | ICD-10-CM | POA: Diagnosis not present

## 2015-03-24 DIAGNOSIS — I2510 Atherosclerotic heart disease of native coronary artery without angina pectoris: Secondary | ICD-10-CM | POA: Diagnosis not present

## 2015-03-24 DIAGNOSIS — R64 Cachexia: Secondary | ICD-10-CM | POA: Diagnosis not present

## 2015-03-24 DIAGNOSIS — K561 Intussusception: Secondary | ICD-10-CM | POA: Diagnosis not present

## 2015-03-24 DIAGNOSIS — N1330 Unspecified hydronephrosis: Secondary | ICD-10-CM | POA: Diagnosis not present

## 2015-03-24 DIAGNOSIS — N179 Acute kidney failure, unspecified: Secondary | ICD-10-CM | POA: Diagnosis not present

## 2015-03-24 DIAGNOSIS — E440 Moderate protein-calorie malnutrition: Secondary | ICD-10-CM | POA: Diagnosis not present

## 2015-03-25 DIAGNOSIS — R64 Cachexia: Secondary | ICD-10-CM | POA: Diagnosis not present

## 2015-03-25 DIAGNOSIS — I2510 Atherosclerotic heart disease of native coronary artery without angina pectoris: Secondary | ICD-10-CM | POA: Diagnosis not present

## 2015-03-25 DIAGNOSIS — N1330 Unspecified hydronephrosis: Secondary | ICD-10-CM | POA: Diagnosis not present

## 2015-03-25 DIAGNOSIS — E440 Moderate protein-calorie malnutrition: Secondary | ICD-10-CM | POA: Diagnosis not present

## 2015-03-25 DIAGNOSIS — K561 Intussusception: Secondary | ICD-10-CM | POA: Diagnosis not present

## 2015-03-25 DIAGNOSIS — N179 Acute kidney failure, unspecified: Secondary | ICD-10-CM | POA: Diagnosis not present

## 2015-03-29 ENCOUNTER — Ambulatory Visit (INDEPENDENT_AMBULATORY_CARE_PROVIDER_SITE_OTHER): Payer: Medicare Other | Admitting: Family Medicine

## 2015-03-29 ENCOUNTER — Encounter: Payer: Self-pay | Admitting: Family Medicine

## 2015-03-29 VITALS — BP 118/66 | HR 75 | Temp 98.4°F | Resp 16 | Ht 64.0 in | Wt 81.7 lb

## 2015-03-29 DIAGNOSIS — Z09 Encounter for follow-up examination after completed treatment for conditions other than malignant neoplasm: Secondary | ICD-10-CM | POA: Diagnosis not present

## 2015-03-29 DIAGNOSIS — Z23 Encounter for immunization: Secondary | ICD-10-CM | POA: Diagnosis not present

## 2015-03-29 DIAGNOSIS — K561 Intussusception: Secondary | ICD-10-CM

## 2015-03-29 DIAGNOSIS — R64 Cachexia: Secondary | ICD-10-CM | POA: Diagnosis not present

## 2015-03-29 NOTE — Progress Notes (Signed)
Name: Tammy Henry   MRN: [id number]    DOB: [birth year]   Date:03/29/2015       Progress Note  Subjective  Chief Complaint  Chief Complaint  Patient presents with  . Hospitalization Follow-up    Went in for abdominal pain on 03/11/15 and stayed in until 03/16/15  . Twisted Bowel    While in the ER they gave her fluids, medication for agitation and for pain. Started her back on Norvasc 5 mg for her BP.     [facility] Follow up: she was hospitalized on 03/06/2015 for evaluation of abdominal pain, and back again on 03/11/2015 with same symptoms, she was diagnosed with ileo-colonic intussusception. . She has lost 6 lbs in the past month. She is able to keep food down, but eats small amounts. She denies any blood in stools, abdominal pain is intermittent and sometimes epigastric or right lower quadrant. She is taking medication for pain - given by hospice. No side effects of medication. She is taking Colace also. She is not drinking a lot of water but has been drinking Ensure. She came in with her daughter Mel Almond and her daughter Otila Kluver that has power of attorney of healthcare was on the phone during the visit.    Patient Active Problem List   Diagnosis Date Noted  . Intussusception intestine ([place])   . Hemorrhoid   . Intussusception of cecum ([place]) 03/11/2015  . Intussusception ([place]) 03/06/2015  . Lower abdominal pain   . Nausea 02/24/2015  . Protein-calorie malnutrition ([place]) 02/24/2015  . Cachexia ([place]) 12/28/2014  . Chronic eczema 12/28/2014  . Pain in shoulder 12/28/2014  . Chronic kidney disease (CKD), stage III (moderate) 12/28/2014  . Major depressive disorder ([place]) 12/28/2014  . Dementia of frontal lobe type 12/28/2014  . History of blood transfusion 12/28/2014  . Anemia, iron deficiency 12/28/2014  . Insomnia 12/28/2014  . Itch of skin 12/28/2014  . Iron deficiency anemia 12/28/2014  . 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 Adult hypothyroidism 05/10/2009  . Varicose vein  05/10/2009  . Chronic venous insufficiency 02/06/2007  . Hypertension goal BP (blood pressure) < 140/90 07/11/2006  . OP (osteoporosis) 07/11/2006    Past Surgical History  Procedure Laterality Date  . Cataract extraction, bilateral      Family History  Problem Relation Age of Onset  . Lymphoma Mother   . Alzheimer's disease Mother   . Alzheimer's disease Father   . Breast cancer Sister   . Depression Daughter   . Asthma Daughter   . Schizophrenia Daughter   . Thyroid disease Daughter     Social History   Social History  . Marital Status: Widowed    Spouse Name: N/A  . Number of Children: N/A  . Years of Education: N/A   Occupational History  . Not on file.   Social History Main Topics  . Smoking status: Never Smoker   . Smokeless tobacco: Never Used  . Alcohol Use: No  . Drug Use: No  . Sexual Activity: Not on file   Other Topics Concern  . Not on file   Social History Narrative     Current outpatient prescriptions:  .  acetaminophen (TYLENOL) 325 MG tablet, Take 2 tablets (650 mg total) by mouth every 6 (six) hours as needed for mild pain (or Fever >/= 101)., Disp: , Rfl:  .  amLODipine (NORVASC) 5 MG tablet, Take 1 tablet (5 mg total) by mouth daily., Disp: 90 tablet, Rfl: 1 .  bisacodyl (  DULCOLAX) 10 MG suppository, Place 1 suppository (10 mg total) rectally daily as needed for moderate constipation., Disp: 6 suppository, Rfl: 0 .  hydrocortisone (ANUSOL-HC) 25 MG suppository, Place 1 suppository (25 mg total) rectally 2 (two) times daily as needed for hemorrhoids or itching., Disp: 12 suppository, Rfl: 0 .  levothyroxine (SYNTHROID, LEVOTHROID) 75 MCG tablet, take 1 tablet by mouth daily, Disp: 30 tablet, Rfl: 2 .  LORazepam (ATIVAN) 0.5 MG tablet, Take 1 tablet (0.5 mg total) by mouth every 6 (six) hours as needed for anxiety., Disp: 15 tablet, Rfl: 0 .  Morphine Sulfate (MORPHINE CONCENTRATE) 10 mg / 0.5 ml concentrated solution, Take 0.25 mLs (5 mg  total) by mouth every 6 (six) hours as needed for severe pain., Disp: 30 mL, Rfl: 0 .  prochlorperazine (COMPAZINE) 25 MG suppository, Place 1 suppository (25 mg total) rectally every 8 (eight) hours as needed for nausea or vomiting., Disp: 12 suppository, Rfl: 0  Allergies  Allergen Reactions  . Lamisil [Terbinafine Hcl]   . Sulfur      ROS  Constitutional: Negative for fever, positive for weight change.  Respiratory: Negative for cough and shortness of breath.   Cardiovascular: Negative for chest pain or palpitations.  Gastrointestinal: Positive for abdominal pain, no bowel changes.  Musculoskeletal: Positive  for gait problem no joint swelling.  Skin: Negative for rash.  Neurological: Negative for dizziness or headache.  No other specific complaints in a complete review of systems (except as listed in HPI above).  Objective  Filed Vitals:   03/29/15 1427  BP: 118/66  Pulse: 75  Temp: 98.4 F (36.9 C)  TempSrc: Oral  Resp: 16  Height: 5' 4"  (1.626 m)  Weight: 81 lb 11.2 oz (37.059 kg)  SpO2: 98%    Body mass index is 14.02 kg/(m^2).  Physical Exam  Constitutional: Patient is cachetic No distress.  HEENT: head atraumatic, normocephalic, pupils equal and reactive to light,neck supple, throat within normal limits Cardiovascular: Normal rate, regular rhythm and normal heart sounds.  No murmur heard. No BLE edema. Pulmonary/Chest: Effort normal and breath sounds normal. No respiratory distress. Abdominal: Soft.  There is no tenderness. Psychiatric: Patient has a normal mood and affect. Asking the same question.  Muscular Skeletal: shuffling gait, using hands as support  Recent Results (from the past 2160 hour(s))  CBC with Differential/Platelet     Status: Abnormal   Collection Time: 02/24/15  4:40 PM  Result Value Ref Range   WBC 7.8 3.4 - 10.8 x10E3/uL   RBC 4.49 3.77 - 5.28 x10E6/uL   Hemoglobin 8.8 (L) 11.1 - 15.9 g/dL   Hematocrit 30.3 (L) 34.0 - 46.6 %   MCV  68 (L) 79 - 97 fL   MCH 19.6 (L) 26.6 - 33.0 pg   MCHC 29.0 (L) 31.5 - 35.7 g/dL   RDW 18.9 (H) 12.3 - 15.4 %   Platelets 541 (H) 150 - 379 x10E3/uL   Neutrophils 70 %   Lymphs 10 %   Monocytes 9 %   Eos 9 %   Basos 1 %   Neutrophils Absolute 5.5 1.4 - 7.0 x10E3/uL   Lymphocytes Absolute 0.7 0.7 - 3.1 x10E3/uL   Monocytes Absolute 0.7 0.1 - 0.9 x10E3/uL   EOS (ABSOLUTE) 0.7 (H) 0.0 - 0.4 x10E3/uL   Basophils Absolute 0.1 0.0 - 0.2 x10E3/uL   Immature Granulocytes 1 %   Immature Grans (Abs) 0.0 0.0 - 0.1 x10E3/uL  Comprehensive metabolic panel     Status: Abnormal  Collection Time: 02/24/15  4:40 PM  Result Value Ref Range   Glucose 60 (L) 65 - 99 mg/dL   BUN 23 8 - 27 mg/dL   Creatinine, Ser 0.97 0.57 - 1.00 mg/dL   GFR calc non Af Amer 54 (L) >59 mL/min/1.73   GFR calc Af Amer 62 >59 mL/min/1.73   BUN/Creatinine Ratio 24 11 - 26   Sodium 141 134 - 144 mmol/L    Comment:               **Please note reference interval change**   Potassium 4.7 3.5 - 5.2 mmol/L   Chloride 102 96 - 106 mmol/L    Comment:               **Please note reference interval change**   CO2 24 18 - 29 mmol/L   Calcium 9.0 8.7 - 10.3 mg/dL   Total Protein 6.2 6.0 - 8.5 g/dL   Albumin 3.6 3.5 - 4.7 g/dL   Globulin, Total 2.6 1.5 - 4.5 g/dL   Albumin/Globulin Ratio 1.4 1.1 - 2.5   Bilirubin Total <0.2 0.0 - 1.2 mg/dL   Alkaline Phosphatase 97 39 - 117 IU/L   AST 18 0 - 40 IU/L   ALT 14 0 - 32 IU/L  TSH     Status: None   Collection Time: 02/24/15  4:40 PM  Result Value Ref Range   TSH 1.070 0.450 - 4.500 uIU/mL  T3, free     Status: Abnormal   Collection Time: 02/24/15  4:40 PM  Result Value Ref Range   T3, Free 1.9 (L) 2.0 - 4.4 pg/mL  T4, free     Status: None   Collection Time: 02/24/15  4:40 PM  Result Value Ref Range   Free T4 1.17 0.82 - 1.77 ng/dL  Ferritin     Status: Abnormal   Collection Time: 02/24/15  4:40 PM  Result Value Ref Range   Ferritin 12 (L) 15 - 150 ng/mL  Iron and TIBC      Status: Abnormal   Collection Time: 02/24/15  4:40 PM  Result Value Ref Range   Total Iron Binding Capacity 326 250 - 450 ug/dL   UIBC 314 118 - 369 ug/dL   Iron 12 (L) 27 - 139 ug/dL   Iron Saturation 4 (LL) 15 - 55 %  Amylase     Status: None   Collection Time: 02/24/15  4:40 PM  Result Value Ref Range   Amylase 64 31 - 124 U/L  Lipase     Status: None   Collection Time: 02/24/15  4:40 PM  Result Value Ref Range   Lipase 40 0 - 59 U/L  Lipase, blood     Status: None   Collection Time: 03/06/15  3:47 PM  Result Value Ref Range   Lipase 24 11 - 51 U/L  Comprehensive metabolic panel     Status: Abnormal   Collection Time: 03/06/15  3:47 PM  Result Value Ref Range   Sodium 137 135 - 145 mmol/L   Potassium 4.5 3.5 - 5.1 mmol/L   Chloride 99 (L) 101 - 111 mmol/L   CO2 31 22 - 32 mmol/L   Glucose, Bld 103 (H) 65 - 99 mg/dL   BUN 28 (H) 6 - 20 mg/dL   Creatinine, Ser 1.02 (H) 0.44 - 1.00 mg/dL   Calcium 9.4 8.9 - 10.3 mg/dL   Total Protein 6.9 6.5 - 8.1 g/dL   Albumin 3.4 (L) 3.5 -  5.0 g/dL   AST 15 15 - 41 U/L   ALT 14 14 - 54 U/L   Alkaline Phosphatase 93 38 - 126 U/L   Total Bilirubin 1.0 0.3 - 1.2 mg/dL    Comment: RESULT CONFIRMED BY MANUAL DILUTION   GFR calc non Af Amer 49 (L) >60 mL/min   GFR calc Af Amer 57 (L) >60 mL/min    Comment: (NOTE) The eGFR has been calculated using the CKD EPI equation. This calculation has not been validated in all clinical situations. eGFR's persistently <60 mL/min signify possible Chronic Kidney Disease.    Anion gap 7 5 - 15  CBC     Status: Abnormal   Collection Time: 03/06/15  3:47 PM  Result Value Ref Range   WBC 10.4 3.6 - 11.0 K/uL   RBC 5.01 3.80 - 5.20 MIL/uL   Hemoglobin 9.8 (L) 12.0 - 16.0 g/dL   HCT 32.2 (L) 35.0 - 47.0 %   MCV 64.2 (L) 80.0 - 100.0 fL   MCH 19.5 (L) 26.0 - 34.0 pg   MCHC 30.5 (L) 32.0 - 36.0 g/dL   RDW 19.9 (H) 11.5 - 14.5 %   Platelets 605 (H) 150 - 440 K/uL  Urinalysis complete, with  microscopic ([facility] only)     Status: Abnormal   Collection Time: 03/06/15  3:47 PM  Result Value Ref Range   Color, Urine YELLOW (A) YELLOW   APPearance HAZY (A) CLEAR   Glucose, UA NEGATIVE NEGATIVE mg/dL   Bilirubin Urine NEGATIVE NEGATIVE   Ketones, ur NEGATIVE NEGATIVE mg/dL   Specific Gravity, Urine 1.019 1.005 - 1.030   Hgb urine dipstick NEGATIVE NEGATIVE   pH 5.0 5.0 - 8.0   Protein, ur NEGATIVE NEGATIVE mg/dL   Nitrite NEGATIVE NEGATIVE   Leukocytes, UA 2+ (A) NEGATIVE   RBC / HPF 0-5 0 - 5 RBC/hpf   WBC, UA TOO NUMEROUS TO COUNT 0 - 5 WBC/hpf   Bacteria, UA MANY (A) NONE SEEN   Squamous Epithelial / LPF 0-5 (A) NONE SEEN   Mucous PRESENT    Hyaline Casts, UA PRESENT   Urine culture     Status: None   Collection Time: 03/06/15  8:48 PM  Result Value Ref Range   Specimen Description URINE, CLEAN CATCH    Special Requests NONE    Culture >=100,000 COLONIES/mL ESCHERICHIA COLI    Report Status 03/08/2015 FINAL    Organism ID, Bacteria ESCHERICHIA COLI       Susceptibility   Escherichia coli - MIC*    AMPICILLIN 4 SENSITIVE Sensitive     CEFAZOLIN <=4 SENSITIVE Sensitive     CEFTRIAXONE <=1 SENSITIVE Sensitive     CIPROFLOXACIN <=0.25 SENSITIVE Sensitive     GENTAMICIN <=1 SENSITIVE Sensitive     IMIPENEM <=0.25 SENSITIVE Sensitive     NITROFURANTOIN 32 SENSITIVE Sensitive     TRIMETH/SULFA >=320 RESISTANT Resistant     Extended ESBL NEGATIVE Sensitive     PIP/TAZO Value in next row Sensitive      SENSITIVE<=4    ERTAPENEM Value in next row Sensitive      SENSITIVE<=0.5    LEVOFLOXACIN Value in next row Sensitive      SENSITIVE<=0.12    * >=100,000 COLONIES/mL ESCHERICHIA COLI  Basic metabolic panel     Status: Abnormal   Collection Time: 03/07/15  5:28 AM  Result Value Ref Range   Sodium 137 135 - 145 mmol/L   Potassium 4.1 3.5 - 5.1 mmol/L  Chloride 104 101 - 111 mmol/L   CO2 29 22 - 32 mmol/L   Glucose, Bld 119 (H) 65 - 99 mg/dL   BUN 21 (H) 6 - 20 mg/dL    Creatinine, Ser 1.11 (H) 0.44 - 1.00 mg/dL   Calcium 8.2 (L) 8.9 - 10.3 mg/dL   GFR calc non Af Amer 45 (L) >60 mL/min   GFR calc Af Amer 52 (L) >60 mL/min    Comment: (NOTE) The eGFR has been calculated using the CKD EPI equation. This calculation has not been validated in all clinical situations. eGFR's persistently <60 mL/min signify possible Chronic Kidney Disease.    Anion gap 4 (L) 5 - 15  CBC     Status: Abnormal   Collection Time: 03/07/15  5:28 AM  Result Value Ref Range   WBC 7.5 3.6 - 11.0 K/uL   RBC 4.22 3.80 - 5.20 MIL/uL   Hemoglobin 8.6 (L) 12.0 - 16.0 g/dL   HCT 27.6 (L) 35.0 - 47.0 %   MCV 65.5 (L) 80.0 - 100.0 fL   MCH 20.3 (L) 26.0 - 34.0 pg   MCHC 31.0 (L) 32.0 - 36.0 g/dL   RDW 20.2 (H) 11.5 - 14.5 %   Platelets 454 (H) 150 - 440 K/uL  CBC     Status: Abnormal   Collection Time: 03/08/15  5:10 AM  Result Value Ref Range   WBC 7.9 3.6 - 11.0 K/uL   RBC 4.37 3.80 - 5.20 MIL/uL   Hemoglobin 8.7 (L) 12.0 - 16.0 g/dL   HCT 28.3 (L) 35.0 - 47.0 %   MCV 64.8 (L) 80.0 - 100.0 fL   MCH 19.9 (L) 26.0 - 34.0 pg   MCHC 30.7 (L) 32.0 - 36.0 g/dL   RDW 20.4 (H) 11.5 - 14.5 %   Platelets 504 (H) 150 - 440 K/uL  Basic metabolic panel     Status: Abnormal   Collection Time: 03/08/15  5:10 AM  Result Value Ref Range   Sodium 139 135 - 145 mmol/L   Potassium 4.6 3.5 - 5.1 mmol/L   Chloride 107 101 - 111 mmol/L   CO2 27 22 - 32 mmol/L   Glucose, Bld 99 65 - 99 mg/dL   BUN 11 6 - 20 mg/dL   Creatinine, Ser 0.77 0.44 - 1.00 mg/dL   Calcium 8.3 (L) 8.9 - 10.3 mg/dL   GFR calc non Af Amer >60 >60 mL/min   GFR calc Af Amer >60 >60 mL/min    Comment: (NOTE) The eGFR has been calculated using the CKD EPI equation. This calculation has not been validated in all clinical situations. eGFR's persistently <60 mL/min signify possible Chronic Kidney Disease.    Anion gap 5 5 - 15  Lactic acid, plasma     Status: None   Collection Time: 03/10/15  8:48 PM  Result Value  Ref Range   Lactic Acid, Venous 0.9 0.5 - 2.0 mmol/L  CBC     Status: Abnormal   Collection Time: 03/10/15  8:49 PM  Result Value Ref Range   WBC 7.0 3.6 - 11.0 K/uL   RBC 4.76 3.80 - 5.20 MIL/uL   Hemoglobin 9.4 (L) 12.0 - 16.0 g/dL   HCT 30.5 (L) 35.0 - 47.0 %   MCV 64.2 (L) 80.0 - 100.0 fL   MCH 19.7 (L) 26.0 - 34.0 pg   MCHC 30.7 (L) 32.0 - 36.0 g/dL   RDW 20.5 (H) 11.5 - 14.5 %   Platelets 576 (H)  150 - 440 K/uL  Comprehensive metabolic panel     Status: Abnormal   Collection Time: 03/10/15  8:49 PM  Result Value Ref Range   Sodium 137 135 - 145 mmol/L   Potassium 4.1 3.5 - 5.1 mmol/L   Chloride 103 101 - 111 mmol/L   CO2 27 22 - 32 mmol/L   Glucose, Bld 97 65 - 99 mg/dL   BUN 13 6 - 20 mg/dL   Creatinine, Ser 0.80 0.44 - 1.00 mg/dL   Calcium 9.0 8.9 - 10.3 mg/dL   Total Protein 6.3 (L) 6.5 - 8.1 g/dL   Albumin 3.1 (L) 3.5 - 5.0 g/dL   AST 14 (L) 15 - 41 U/L   ALT 12 (L) 14 - 54 U/L   Alkaline Phosphatase 85 38 - 126 U/L   Total Bilirubin 0.3 0.3 - 1.2 mg/dL   GFR calc non Af Amer >60 >60 mL/min   GFR calc Af Amer >60 >60 mL/min    Comment: (NOTE) The eGFR has been calculated using the CKD EPI equation. This calculation has not been validated in all clinical situations. eGFR's persistently <60 mL/min signify possible Chronic Kidney Disease.    Anion gap 7 5 - 15  Lactic acid, plasma     Status: None   Collection Time: 03/11/15  3:26 AM  Result Value Ref Range   Lactic Acid, Venous 0.9 0.5 - 2.0 mmol/L  Basic metabolic panel     Status: Abnormal   Collection Time: 03/11/15  3:26 AM  Result Value Ref Range   Sodium 135 135 - 145 mmol/L   Potassium 4.0 3.5 - 5.1 mmol/L   Chloride 104 101 - 111 mmol/L   CO2 28 22 - 32 mmol/L   Glucose, Bld 106 (H) 65 - 99 mg/dL   BUN 10 6 - 20 mg/dL   Creatinine, Ser 0.82 0.44 - 1.00 mg/dL   Calcium 8.5 (L) 8.9 - 10.3 mg/dL   GFR calc non Af Amer >60 >60 mL/min   GFR calc Af Amer >60 >60 mL/min    Comment: (NOTE) The eGFR  has been calculated using the CKD EPI equation. This calculation has not been validated in all clinical situations. eGFR's persistently <60 mL/min signify possible Chronic Kidney Disease.    Anion gap 3 (L) 5 - 15  Urine culture     Status: None   Collection Time: 03/11/15  8:01 PM  Result Value Ref Range   Specimen Description URINE, RANDOM    Special Requests NONE    Culture NO GROWTH 2 DAYS    Report Status 03/13/2015 FINAL   Urinalysis complete, with microscopic ([facility] only)     Status: Abnormal   Collection Time: 03/11/15  8:01 PM  Result Value Ref Range   Color, Urine STRAW (A) YELLOW   APPearance HAZY (A) CLEAR   Glucose, UA NEGATIVE NEGATIVE mg/dL   Bilirubin Urine NEGATIVE NEGATIVE   Ketones, ur NEGATIVE NEGATIVE mg/dL   Specific Gravity, Urine 1.010 1.005 - 1.030   Hgb urine dipstick NEGATIVE NEGATIVE   pH 6.0 5.0 - 8.0   Protein, ur NEGATIVE NEGATIVE mg/dL   Nitrite NEGATIVE NEGATIVE   Leukocytes, UA TRACE (A) NEGATIVE   RBC / HPF 6-30 0 - 5 RBC/hpf   WBC, UA 6-30 0 - 5 WBC/hpf   Bacteria, UA NONE SEEN NONE SEEN   Squamous Epithelial / LPF 0-5 (A) NONE SEEN  CBC     Status: Abnormal   Collection Time: 03/14/15  4:51 AM  Result Value Ref Range   WBC 6.9 3.6 - 11.0 K/uL   RBC 4.40 3.80 - 5.20 MIL/uL   Hemoglobin 8.6 (L) 12.0 - 16.0 g/dL   HCT 28.2 (L) 35.0 - 47.0 %   MCV 64.1 (L) 80.0 - 100.0 fL   MCH 19.4 (L) 26.0 - 34.0 pg   MCHC 30.3 (L) 32.0 - 36.0 g/dL   RDW 20.1 (H) 11.5 - 14.5 %   Platelets 516 (H) 150 - 440 K/uL  Creatinine, serum     Status: Abnormal   Collection Time: 03/14/15  4:54 AM  Result Value Ref Range   Creatinine, Ser 0.95 0.44 - 1.00 mg/dL   GFR calc non Af Amer 54 (L) >60 mL/min   GFR calc Af Amer >60 >60 mL/min    Comment: (NOTE) The eGFR has been calculated using the CKD EPI equation. This calculation has not been validated in all clinical situations. eGFR's persistently <60 mL/min signify possible Chronic Kidney Disease.   Albumin      Status: Abnormal   Collection Time: 03/14/15  4:54 AM  Result Value Ref Range   Albumin 2.9 (L) 3.5 - 5.0 g/dL     PHQ2/9: Depression screen PHQ 2/9 02/24/2015  Decreased Interest 0  Down, Depressed, Hopeless 0  PHQ - 2 Score 0     Fall Risk: Fall Risk  03/03/2015 02/24/2015  Falls in the past year? Yes Yes  Number falls in past yr: 1 1  Injury with Fall? Yes No  Risk Factor Category  High Fall Risk -  Follow up Falls evaluation completed;Falls prevention discussed -     Assessment & Plan  1. Intussusception ([place])  Continue Morphine for pain control, and colace to avoid constipation  2. Cachexia ([place])  Continue protein supplementation and also small frequent meals as tolerated  3. Hospital discharge follow-up  Continue current management, under hospice, palliative care  4. Need for pneumococcal vaccination  - Pneumococcal conjugate vaccine 13-valent IM

## 2015-03-31 DIAGNOSIS — E440 Moderate protein-calorie malnutrition: Secondary | ICD-10-CM | POA: Diagnosis not present

## 2015-03-31 DIAGNOSIS — I2510 Atherosclerotic heart disease of native coronary artery without angina pectoris: Secondary | ICD-10-CM | POA: Diagnosis not present

## 2015-03-31 DIAGNOSIS — N179 Acute kidney failure, unspecified: Secondary | ICD-10-CM | POA: Diagnosis not present

## 2015-03-31 DIAGNOSIS — R64 Cachexia: Secondary | ICD-10-CM | POA: Diagnosis not present

## 2015-03-31 DIAGNOSIS — K561 Intussusception: Secondary | ICD-10-CM | POA: Diagnosis not present

## 2015-03-31 DIAGNOSIS — N1330 Unspecified hydronephrosis: Secondary | ICD-10-CM | POA: Diagnosis not present

## 2015-04-01 DIAGNOSIS — I2510 Atherosclerotic heart disease of native coronary artery without angina pectoris: Secondary | ICD-10-CM | POA: Diagnosis not present

## 2015-04-01 DIAGNOSIS — N179 Acute kidney failure, unspecified: Secondary | ICD-10-CM | POA: Diagnosis not present

## 2015-04-01 DIAGNOSIS — R64 Cachexia: Secondary | ICD-10-CM | POA: Diagnosis not present

## 2015-04-01 DIAGNOSIS — K561 Intussusception: Secondary | ICD-10-CM | POA: Diagnosis not present

## 2015-04-01 DIAGNOSIS — N1330 Unspecified hydronephrosis: Secondary | ICD-10-CM | POA: Diagnosis not present

## 2015-04-01 DIAGNOSIS — E440 Moderate protein-calorie malnutrition: Secondary | ICD-10-CM | POA: Diagnosis not present

## 2015-04-06 DIAGNOSIS — N1330 Unspecified hydronephrosis: Secondary | ICD-10-CM | POA: Diagnosis not present

## 2015-04-06 DIAGNOSIS — R64 Cachexia: Secondary | ICD-10-CM | POA: Diagnosis not present

## 2015-04-06 DIAGNOSIS — E440 Moderate protein-calorie malnutrition: Secondary | ICD-10-CM | POA: Diagnosis not present

## 2015-04-06 DIAGNOSIS — N179 Acute kidney failure, unspecified: Secondary | ICD-10-CM | POA: Diagnosis not present

## 2015-04-06 DIAGNOSIS — I2510 Atherosclerotic heart disease of native coronary artery without angina pectoris: Secondary | ICD-10-CM | POA: Diagnosis not present

## 2015-04-06 DIAGNOSIS — K561 Intussusception: Secondary | ICD-10-CM | POA: Diagnosis not present

## 2015-04-07 DIAGNOSIS — Z681 Body mass index (BMI) 19 or less, adult: Secondary | ICD-10-CM | POA: Diagnosis not present

## 2015-04-07 DIAGNOSIS — N179 Acute kidney failure, unspecified: Secondary | ICD-10-CM | POA: Diagnosis not present

## 2015-04-07 DIAGNOSIS — F0280 Dementia in other diseases classified elsewhere without behavioral disturbance: Secondary | ICD-10-CM | POA: Diagnosis not present

## 2015-04-07 DIAGNOSIS — F419 Anxiety disorder, unspecified: Secondary | ICD-10-CM | POA: Diagnosis not present

## 2015-04-07 DIAGNOSIS — E440 Moderate protein-calorie malnutrition: Secondary | ICD-10-CM | POA: Diagnosis not present

## 2015-04-07 DIAGNOSIS — F329 Major depressive disorder, single episode, unspecified: Secondary | ICD-10-CM | POA: Diagnosis not present

## 2015-04-07 DIAGNOSIS — G3109 Other frontotemporal dementia: Secondary | ICD-10-CM | POA: Diagnosis not present

## 2015-04-07 DIAGNOSIS — R64 Cachexia: Secondary | ICD-10-CM | POA: Diagnosis not present

## 2015-04-07 DIAGNOSIS — I2510 Atherosclerotic heart disease of native coronary artery without angina pectoris: Secondary | ICD-10-CM | POA: Diagnosis not present

## 2015-04-07 DIAGNOSIS — M810 Age-related osteoporosis without current pathological fracture: Secondary | ICD-10-CM | POA: Diagnosis not present

## 2015-04-07 DIAGNOSIS — N1330 Unspecified hydronephrosis: Secondary | ICD-10-CM | POA: Diagnosis not present

## 2015-04-07 DIAGNOSIS — I872 Venous insufficiency (chronic) (peripheral): Secondary | ICD-10-CM | POA: Diagnosis not present

## 2015-04-07 DIAGNOSIS — Z8673 Personal history of transient ischemic attack (TIA), and cerebral infarction without residual deficits: Secondary | ICD-10-CM | POA: Diagnosis not present

## 2015-04-07 DIAGNOSIS — K561 Intussusception: Secondary | ICD-10-CM | POA: Diagnosis not present

## 2015-04-07 DIAGNOSIS — I10 Essential (primary) hypertension: Secondary | ICD-10-CM | POA: Diagnosis not present

## 2015-04-08 DIAGNOSIS — K561 Intussusception: Secondary | ICD-10-CM | POA: Diagnosis not present

## 2015-04-08 DIAGNOSIS — N1330 Unspecified hydronephrosis: Secondary | ICD-10-CM | POA: Diagnosis not present

## 2015-04-08 DIAGNOSIS — N179 Acute kidney failure, unspecified: Secondary | ICD-10-CM | POA: Diagnosis not present

## 2015-04-08 DIAGNOSIS — I2510 Atherosclerotic heart disease of native coronary artery without angina pectoris: Secondary | ICD-10-CM | POA: Diagnosis not present

## 2015-04-08 DIAGNOSIS — R64 Cachexia: Secondary | ICD-10-CM | POA: Diagnosis not present

## 2015-04-08 DIAGNOSIS — E440 Moderate protein-calorie malnutrition: Secondary | ICD-10-CM | POA: Diagnosis not present

## 2015-05-07 ENCOUNTER — Other Ambulatory Visit: Payer: Self-pay | Admitting: Family Medicine

## 2015-05-14 ENCOUNTER — Encounter: Payer: Self-pay | Admitting: *Deleted

## 2015-05-28 ENCOUNTER — Other Ambulatory Visit: Payer: Self-pay | Admitting: Family Medicine

## 2015-06-16 ENCOUNTER — Telehealth: Payer: Self-pay | Admitting: Family Medicine

## 2015-06-16 MED ORDER — LORAZEPAM 0.5 MG PO TABS: 0.5000 mg | ORAL_TABLET | Freq: Four times a day (QID) | ORAL | Status: DC | PRN

## 2015-06-16 NOTE — Telephone Encounter (Signed)
Requesting refill on Lorazepam please send to [facility]

## 2015-06-16 NOTE — Telephone Encounter (Signed)
Please call it in. 15 pills only

## 2015-06-22 ENCOUNTER — Other Ambulatory Visit: Payer: Self-pay | Admitting: Family Medicine

## 2015-06-29 ENCOUNTER — Ambulatory Visit: Payer: Medicare Other | Admitting: Family Medicine

## 2015-06-30 ENCOUNTER — Ambulatory Visit (INDEPENDENT_AMBULATORY_CARE_PROVIDER_SITE_OTHER): Payer: Medicare Other | Admitting: Family Medicine

## 2015-06-30 ENCOUNTER — Encounter: Payer: Self-pay | Admitting: Family Medicine

## 2015-06-30 VITALS — BP 122/62 | HR 69 | Temp 98.2°F | Resp 16 | Ht 64.0 in | Wt 83.9 lb

## 2015-06-30 DIAGNOSIS — E038 Other specified hypothyroidism: Secondary | ICD-10-CM

## 2015-06-30 DIAGNOSIS — F330 Major depressive disorder, recurrent, mild: Secondary | ICD-10-CM | POA: Diagnosis not present

## 2015-06-30 DIAGNOSIS — L309 Dermatitis, unspecified: Secondary | ICD-10-CM | POA: Diagnosis not present

## 2015-06-30 DIAGNOSIS — N183 Chronic kidney disease, stage 3 unspecified: Secondary | ICD-10-CM

## 2015-06-30 DIAGNOSIS — L0889 Other specified local infections of the skin and subcutaneous tissue: Secondary | ICD-10-CM | POA: Diagnosis not present

## 2015-06-30 DIAGNOSIS — D509 Iron deficiency anemia, unspecified: Secondary | ICD-10-CM

## 2015-06-30 DIAGNOSIS — D1739 Benign lipomatous neoplasm of skin and subcutaneous tissue of other sites: Secondary | ICD-10-CM | POA: Diagnosis not present

## 2015-06-30 DIAGNOSIS — L989 Disorder of the skin and subcutaneous tissue, unspecified: Secondary | ICD-10-CM

## 2015-06-30 DIAGNOSIS — I10 Essential (primary) hypertension: Secondary | ICD-10-CM

## 2015-06-30 DIAGNOSIS — R64 Cachexia: Secondary | ICD-10-CM | POA: Diagnosis not present

## 2015-06-30 DIAGNOSIS — D1720 Benign lipomatous neoplasm of skin and subcutaneous tissue of unspecified limb: Secondary | ICD-10-CM

## 2015-06-30 DIAGNOSIS — L089 Local infection of the skin and subcutaneous tissue, unspecified: Secondary | ICD-10-CM

## 2015-06-30 DIAGNOSIS — R2689 Other abnormalities of gait and mobility: Secondary | ICD-10-CM

## 2015-06-30 MED ORDER — TRIAMCINOLONE ACETONIDE 0.1 % EX CREA: TOPICAL_CREAM | CUTANEOUS | Status: AC

## 2015-06-30 MED ORDER — AMLODIPINE BESYLATE 2.5 MG PO TABS: 2.5000 mg | ORAL_TABLET | Freq: Every day | ORAL | Status: DC

## 2015-06-30 NOTE — Progress Notes (Signed)
Name: Tammy Henry   MRN: [id number]    DOB: 1930/01/16   Date:06/30/2015       Progress Note  Subjective  Chief Complaint  Chief Complaint  Patient presents with  . Referral    patient has a place on her chin that they want the dermatologists to check out.  . Cellulitis    patient has 2 areas of concern on the right leg that might be infected.  . Hip Problem    patient's left hip is sticking out more than the right hip.    HPI  Cachexia: she is still very frail but gained 1 lbs, appetite has been good, eating puree food. Needs assistance with bathing and dressing and transportation.   Anemia iron deficiency: missed appointment with Dr. Mike Gip, patient denies SOB or significant fatigue. Eating more iron in her diet, and taking iron supplementation and Centrum silver  Rash on legs: daughter has noticed some pustules on left leg today, patient has not complained of pain or discomfort, she has been afebrile  Lipoma: noticed by daughter recently, but CNA that cares for her states it has been present for at least 6 months  HTN: taking bp medication, denies dizziness or chest pain  Major Depression: doing better, occasionally she gets frustrated and CNA gives her BZD very seldom for agitation, explained that medication is to be given sporadically and it can cause sedation and increases risk of fall  Chronic Eczema: doing better now, using Eucerin mixed with steroids on both legs. Still has some pruritus.   Patient Active Problem List   Diagnosis Date Noted  . Intussusception intestine ([place])   . Hemorrhoid   . Intussusception of cecum ([place]) 03/11/2015  . Intussusception ([place]) 03/06/2015  . Protein-calorie malnutrition ([place]) 02/24/2015  . Cachexia ([place]) 12/28/2014  . Chronic eczema 12/28/2014  . Pain in shoulder 12/28/2014  . Chronic kidney disease (CKD), stage III (moderate) 12/28/2014  . Major depressive disorder ([place]) 12/28/2014  . Dementia of frontal lobe type 12/28/2014   . History of blood transfusion 12/28/2014  . Anemia, iron deficiency 12/28/2014  . Insomnia 12/28/2014  . Itch of skin 12/28/2014  . Iron deficiency anemia 12/28/2014  . 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 Adult hypothyroidism 05/10/2009  . Varicose vein 05/10/2009  . Chronic venous insufficiency 02/06/2007  . Hypertension goal BP (blood pressure) < 140/90 07/11/2006  . OP (osteoporosis) 07/11/2006    Past Surgical History  Procedure Laterality Date  . Cataract extraction, bilateral      Family History  Problem Relation Age of Onset  . Lymphoma Mother   . Alzheimer's disease Mother   . Alzheimer's disease Father   . Breast cancer Sister   . Depression Daughter   . Asthma Daughter   . Schizophrenia Daughter   . Thyroid disease Daughter     Social History   Social History  . Marital Status: Widowed    Spouse Name: N/A  . Number of Children: N/A  . Years of Education: N/A   Occupational History  . Not on file.   Social History Main Topics  . Smoking status: Never Smoker   . Smokeless tobacco: Never Used  . Alcohol Use: No  . Drug Use: No  . Sexual Activity: Not on file   Other Topics Concern  . Not on file   Social History Narrative     Current outpatient prescriptions:  .  acetaminophen (TYLENOL) 325 MG tablet, Take 2 tablets (650 mg total) by mouth every 6 (  six) hours as needed for mild pain (or Fever >/= 101)., Disp: , Rfl:  .  amLODipine (NORVASC) 2.5 MG tablet, Take 1 tablet (2.5 mg total) by mouth daily., Disp: 30 tablet, Rfl: 5 .  bisacodyl (DULCOLAX) 10 MG suppository, Place 1 suppository (10 mg total) rectally daily as needed for moderate constipation., Disp: 6 suppository, Rfl: 0 .  hydrocortisone (ANUSOL-HC) 25 MG suppository, Place 1 suppository (25 mg total) rectally 2 (two) times daily as needed for hemorrhoids or itching., Disp: 12 suppository, Rfl: 0 .  levothyroxine (SYNTHROID, LEVOTHROID) 75 MCG tablet, take 1 tablet by mouth once daily, Disp:  30 tablet, Rfl: 2 .  LORazepam (ATIVAN) 0.5 MG tablet, Take 1 tablet (0.5 mg total) by mouth every 6 (six) hours as needed for anxiety., Disp: 15 tablet, Rfl: 0 .  triamcinolone cream (KENALOG) 0.1 %, MIX WITH EUCERIN AT HOME AND APPLY TO RASH twice a day as directed BY PRESCRIBER, Disp: 80 g, Rfl: 1  Allergies  Allergen Reactions  . Lamisil [Terbinafine Hcl]   . Sulfur      ROS  Ten systems reviewed and is negative except as mentioned in HPI   Objective  Filed Vitals:   06/30/15 1453  BP: 122/62  Pulse: 69  Temp: 98.2 F (36.8 C)  TempSrc: Oral  Resp: 16  Height: 5\' 4"  (1.626 m)  Weight: 83 lb 14.4 oz (38.057 kg)  SpO2: 98%    Body mass index is 14.39 kg/(m^2).  Physical Exam  Constitutional: Patient appears well-developed and cachetic, pale. No distress.  HEENT: head atraumatic, normocephalic, pupils equal and reactive to light,  neck supple, throat within normal limits Cardiovascular: Normal rate, regular rhythm and normal heart sounds.  No murmur heard. No BLE edema. Pulmonary/Chest: Effort normal and breath sounds normal. No respiratory distress. Abdominal: Soft.  There is no tenderness. Psychiatric: Patient has a normal mood and affect. Does not seem to hear well during the visit, kept looking at her daughter Skin: soft mass on left outer hip, likely lipoma, also has two pustule on left leg, mild erythema from eczema but greatly improved, also has a white, pedunculated lesion on left side of chin    PHQ2/9: Depression screen [facility] 2/9 06/30/2015 02/24/2015  Decreased Interest 0 0  Down, Depressed, Hopeless 0 0  PHQ - 2 Score 0 0     Fall Risk: Fall Risk  06/30/2015 03/03/2015 02/24/2015  Falls in the past year? No Yes Yes  Number falls in past yr: - 1 1  Injury with Fall? - Yes No  Risk Factor Category  - High Fall Risk -  Risk for fall due to : Impaired balance/gait - -  Follow up - Falls evaluation completed;Falls prevention discussed -       Functional Status Survey: Is the patient deaf or have difficulty hearing?: Yes Does the patient have difficulty seeing, even when wearing glasses/contacts?: No Does the patient have difficulty concentrating, remembering, or making decisions?: Yes Does the patient have difficulty walking or climbing stairs?: Yes Does the patient have difficulty dressing or bathing?: Yes (needs help with bathing and dressing) Does the patient have difficulty doing errands alone such as visiting a doctor's office or shopping?: Yes    Assessment & Plan   1. Cachexia ([place])  Gained 1 lb since last visit, appetite is better, but has difficulty chewing because of poor fitting dentures - having to puree her food - Comprehensive metabolic panel  2. Other specified hypothyroidism  - TSH  3. Anemia, iron deficiency  Needs to follow up with Dr. Mike Gip - CBC with Differential/Platelet - Ferritin  4. Chronic eczema  Much better on both legs - triamcinolone cream (KENALOG) 0.1 %; MIX WITH EUCERIN AT HOME AND APPLY TO RASH twice a day as directed BY PRESCRIBER  Dispense: 80 g; Refill: 1  5. Hypertension goal BP (blood pressure) < 140/90  BP is towards low end of normal we will decrease dose of Norvasc - amLODipine (NORVASC) 2.5 MG tablet; Take 1 tablet (2.5 mg total) by mouth daily.  Dispense: 30 tablet; Refill: 5 - Comprehensive metabolic panel  6. Chronic kidney disease (CKD), stage III (moderate)  - Comprehensive metabolic panel  7. Mild episode of recurrent major depressive disorder ([place])  Doing better now  8. Shuffling gait  stable  9. Skin lesion, superficial  On face likely wart versus skin tag - Ambulatory referral to Dermatology  10. Skin pustule  Consent : YES - verbally given   Procedure: Incision and drainage of pustule  Location: left leg Equipment used: sterile scalpel Anesthesia:  Not done Cleaned and prepped: alcohol   After verbal consent given , affected  area of skin prepped with alcohol .  Small incision  #10 blade made over pustule . Purulent material expressed,and specimen collected for culture  -culture ordered  11. Lipoma of thigh  One of the caregiver has noticed for over 6 months and not growing, not painful

## 2015-07-01 DIAGNOSIS — L0889 Other specified local infections of the skin and subcutaneous tissue: Secondary | ICD-10-CM | POA: Diagnosis not present

## 2015-07-01 LAB — TSH: TSH: 6.13 u[IU]/mL — AB (ref 0.450–4.500)

## 2015-07-01 LAB — CBC WITH DIFFERENTIAL/PLATELET
BASOS ABS: 0.1 10*3/uL (ref 0.0–0.2)
BASOS: 1 %
EOS (ABSOLUTE): 0.5 10*3/uL — ABNORMAL HIGH (ref 0.0–0.4)
Eos: 8 %
HEMATOCRIT: 39.5 % (ref 34.0–46.6)
Hemoglobin: 12.5 g/dL (ref 11.1–15.9)
Immature Grans (Abs): 0.1 10*3/uL (ref 0.0–0.1)
Immature Granulocytes: 1 %
LYMPHS ABS: 1 10*3/uL (ref 0.7–3.1)
Lymphs: 15 %
MCH: 24.8 pg — AB (ref 26.6–33.0)
MCHC: 31.6 g/dL (ref 31.5–35.7)
MCV: 78 fL — AB (ref 79–97)
MONOCYTES: 7 %
Monocytes Absolute: 0.4 10*3/uL (ref 0.1–0.9)
NEUTROS ABS: 4.5 10*3/uL (ref 1.4–7.0)
NEUTROS PCT: 68 %
Platelets: 438 10*3/uL — ABNORMAL HIGH (ref 150–379)
RBC: 5.04 x10E6/uL (ref 3.77–5.28)
WBC: 6.6 10*3/uL (ref 3.4–10.8)

## 2015-07-01 LAB — COMPREHENSIVE METABOLIC PANEL
A/G RATIO: 1.8 (ref 1.2–2.2)
ALBUMIN: 3.9 g/dL (ref 3.5–4.7)
ALT: 14 IU/L (ref 0–32)
AST: 18 IU/L (ref 0–40)
Alkaline Phosphatase: 83 IU/L (ref 39–117)
BUN/Creatinine Ratio: 26 (ref 12–28)
BUN: 24 mg/dL (ref 8–27)
Bilirubin Total: <0.2 mg/dL (ref 0.0–1.2)
CALCIUM: 9.6 mg/dL (ref 8.7–10.3)
CO2: 26 mmol/L (ref 18–29)
CREATININE: 0.93 mg/dL (ref 0.57–1.00)
Chloride: 103 mmol/L (ref 96–106)
GFR calc Af Amer: 66 mL/min/{1.73_m2} (ref >59)
GFR, EST NON AFRICAN AMERICAN: 57 mL/min/{1.73_m2} — AB (ref >59)
GLOBULIN, TOTAL: 2.2 g/dL (ref 1.5–4.5)
Glucose: 58 mg/dL — ABNORMAL LOW (ref 65–99)
Potassium: 5.3 mmol/L — ABNORMAL HIGH (ref 3.5–5.2)
SODIUM: 144 mmol/L (ref 134–144)
TOTAL PROTEIN: 6.1 g/dL (ref 6.0–8.5)

## 2015-07-01 LAB — FERRITIN: Ferritin: 27 ng/mL (ref 15–150)

## 2015-07-04 LAB — BODY FLUID CULTURE

## 2015-07-05 ENCOUNTER — Telehealth: Payer: Self-pay | Admitting: Family Medicine

## 2015-07-05 NOTE — Telephone Encounter (Signed)
Patient requesting to referred to [facility] with Dr. Tyler Deis

## 2015-07-06 NOTE — Telephone Encounter (Signed)
Switched referral and refaxed to [facility].

## 2015-08-17 ENCOUNTER — Other Ambulatory Visit: Payer: Self-pay | Admitting: Family Medicine

## 2015-08-17 NOTE — Telephone Encounter (Signed)
Patient requesting refill. 

## 2015-09-15 ENCOUNTER — Other Ambulatory Visit: Payer: Self-pay | Admitting: Family Medicine

## 2015-09-15 NOTE — Telephone Encounter (Signed)
Patient requesting refill. 

## 2015-09-27 ENCOUNTER — Ambulatory Visit: Payer: Medicare Other | Admitting: Family Medicine

## 2015-10-15 ENCOUNTER — Encounter: Payer: Self-pay | Admitting: Internal Medicine

## 2015-10-15 DIAGNOSIS — L309 Dermatitis, unspecified: Secondary | ICD-10-CM | POA: Diagnosis not present

## 2015-10-15 DIAGNOSIS — F0281 Dementia in other diseases classified elsewhere with behavioral disturbance: Secondary | ICD-10-CM | POA: Diagnosis not present

## 2015-10-15 DIAGNOSIS — K561 Intussusception: Secondary | ICD-10-CM | POA: Diagnosis not present

## 2015-10-15 DIAGNOSIS — E039 Hypothyroidism, unspecified: Secondary | ICD-10-CM | POA: Diagnosis not present

## 2015-10-15 DIAGNOSIS — I10 Essential (primary) hypertension: Secondary | ICD-10-CM | POA: Diagnosis not present

## 2015-10-15 DIAGNOSIS — F419 Anxiety disorder, unspecified: Secondary | ICD-10-CM | POA: Diagnosis not present

## 2015-10-15 DIAGNOSIS — M810 Age-related osteoporosis without current pathological fracture: Secondary | ICD-10-CM | POA: Diagnosis not present

## 2015-10-21 ENCOUNTER — Emergency Department
Admission: EM | Admit: 2015-10-21 | Discharge: 2015-10-21 | Disposition: A | Discharge status: Home / Self Care | Payer: Medicare Other | Attending: Emergency Medicine | Admitting: Emergency Medicine

## 2015-10-21 ENCOUNTER — Encounter: Payer: Self-pay | Admitting: Emergency Medicine

## 2015-10-21 ENCOUNTER — Emergency Department: Payer: Medicare Other

## 2015-10-21 DIAGNOSIS — N183 Chronic kidney disease, stage 3 (moderate): Secondary | ICD-10-CM | POA: Insufficient documentation

## 2015-10-21 DIAGNOSIS — Y939 Activity, unspecified: Secondary | ICD-10-CM | POA: Insufficient documentation

## 2015-10-21 DIAGNOSIS — I129 Hypertensive chronic kidney disease with stage 1 through stage 4 chronic kidney disease, or unspecified chronic kidney disease: Secondary | ICD-10-CM | POA: Insufficient documentation

## 2015-10-21 DIAGNOSIS — S0990XA Unspecified injury of head, initial encounter: Secondary | ICD-10-CM

## 2015-10-21 DIAGNOSIS — S0181XA Laceration without foreign body of other part of head, initial encounter: Secondary | ICD-10-CM | POA: Diagnosis not present

## 2015-10-21 DIAGNOSIS — Y999 Unspecified external cause status: Secondary | ICD-10-CM | POA: Diagnosis not present

## 2015-10-21 DIAGNOSIS — Z791 Long term (current) use of non-steroidal anti-inflammatories (NSAID): Secondary | ICD-10-CM | POA: Diagnosis not present

## 2015-10-21 DIAGNOSIS — S0083XA Contusion of other part of head, initial encounter: Secondary | ICD-10-CM

## 2015-10-21 DIAGNOSIS — Z23 Encounter for immunization: Secondary | ICD-10-CM | POA: Diagnosis not present

## 2015-10-21 DIAGNOSIS — S81012A Laceration without foreign body, left knee, initial encounter: Secondary | ICD-10-CM | POA: Diagnosis not present

## 2015-10-21 DIAGNOSIS — Z79899 Other long term (current) drug therapy: Secondary | ICD-10-CM | POA: Diagnosis not present

## 2015-10-21 DIAGNOSIS — S01112A Laceration without foreign body of left eyelid and periocular area, initial encounter: Secondary | ICD-10-CM | POA: Insufficient documentation

## 2015-10-21 DIAGNOSIS — W19XXXA Unspecified fall, initial encounter: Secondary | ICD-10-CM | POA: Diagnosis not present

## 2015-10-21 DIAGNOSIS — E039 Hypothyroidism, unspecified: Secondary | ICD-10-CM | POA: Insufficient documentation

## 2015-10-21 DIAGNOSIS — Y92009 Unspecified place in unspecified non-institutional (private) residence as the place of occurrence of the external cause: Secondary | ICD-10-CM | POA: Diagnosis not present

## 2015-10-21 LAB — CBC
HCT: 40.4 % (ref 35.0–47.0)
HEMOGLOBIN: 13.3 g/dL (ref 12.0–16.0)
MCH: 27.8 pg (ref 26.0–34.0)
MCHC: 32.9 g/dL (ref 32.0–36.0)
MCV: 84.7 fL (ref 80.0–100.0)
PLATELETS: 395 10*3/uL (ref 150–440)
RBC: 4.77 MIL/uL (ref 3.80–5.20)
RDW: 16 % — ABNORMAL HIGH (ref 11.5–14.5)
WBC: 7 10*3/uL (ref 3.6–11.0)

## 2015-10-21 LAB — BASIC METABOLIC PANEL
ANION GAP: 5 (ref 5–15)
BUN: 29 mg/dL — ABNORMAL HIGH (ref 6–20)
CALCIUM: 8.8 mg/dL — AB (ref 8.9–10.3)
CO2: 31 mmol/L (ref 22–32)
CREATININE: 1.07 mg/dL — AB (ref 0.44–1.00)
Chloride: 109 mmol/L (ref 101–111)
GFR, EST AFRICAN AMERICAN: 54 mL/min — AB (ref >60)
GFR, EST NON AFRICAN AMERICAN: 47 mL/min — AB (ref >60)
Glucose, Bld: 73 mg/dL (ref 65–99)
Potassium: 4 mmol/L (ref 3.5–5.1)
SODIUM: 145 mmol/L (ref 135–145)

## 2015-10-21 LAB — TROPONIN I

## 2015-10-21 MED ORDER — TETANUS-DIPHTH-ACELL PERTUSSIS 5-2.5-18.5 LF-MCG/0.5 IM SUSP
0.5000 mL | Freq: Once | INTRAMUSCULAR | Status: AC
  Administered 2015-10-21: 0.5 mL via INTRAMUSCULAR
  Filled 2015-10-21 (×2): qty 0.5

## 2015-10-21 NOTE — ED Triage Notes (Addendum)
Pt had unwitnessed fall last night. Has felt a little weak and dizzy. Pt had dementia and does not remember fall. Pt has irregular laceration to forehead. Skin tear to back. No blood thinners. Pt has felt a little weak and dizzy. Hx anemia with blood transfusion.

## 2015-10-21 NOTE — ED Provider Notes (Signed)
[facility] Emergency Department Provider Note  ____________________________________________   I have reviewed the triage vital signs and the nursing notes.   HISTORY  Chief Complaint Fall    HPI Tammy Henry is a 82 y.o. female who presents today complaining of a fall. Patient fell 24 hours ago proximally. She did not lose consciousness. She does have some cuts on her forehead. They have been dressed at home. Tetanus shot is not known to be up-to-date. The patient has no complaints at this time. They brought her in because she bumped her head. She is not on blood thinners.Patient did not pass out, this is a non-syncopal event. She walks with a cane and lost her footing. She remembers the accident.     Past Medical History:  Diagnosis Date  . Acute kidney failure, unspecified ([place])   . Anxiety state, unspecified   . Cachexia ([place])   . Dehydration   . Depressive disorder, not elsewhere classified   . Early satiety   . Edema   . Essential hypertension, benign   . Functional diarrhea   . Iron deficiency anemia, unspecified   . Nonspecific abnormal electrocardiogram (ECG) (EKG)   . Osteoporosis, unspecified   . Other and unspecified disc disorder of cervical region   . Other frontotemporal dementia   . Other psoriasis   . Other specified disorders of shoulder joint   . Unspecified hypothyroidism   . Unspecified pruritic disorder   . Unspecified venous (peripheral) insufficiency   . Urinary tract infection, site not specified     Patient Active Problem List   Diagnosis Date Noted  . Intussusception intestine ([place])   . Hemorrhoid   . Intussusception of cecum ([place]) 03/11/2015  . Intussusception ([place]) 03/06/2015  . Protein-calorie malnutrition ([place]) 02/24/2015  . Cachexia ([place]) 12/28/2014  . Chronic eczema 12/28/2014  . Pain in shoulder 12/28/2014  . Chronic kidney disease (CKD), stage III (moderate) 12/28/2014  . Major depressive disorder  ([place]) 12/28/2014  . Dementia of frontal lobe type 12/28/2014  . History of blood transfusion 12/28/2014  . Anemia, iron deficiency 12/28/2014  . Insomnia 12/28/2014  . Itch of skin 12/28/2014  . Iron deficiency anemia 12/28/2014  . 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 Adult hypothyroidism 05/10/2009  . Varicose vein 05/10/2009  . Chronic venous insufficiency 02/06/2007  . Hypertension goal BP (blood pressure) < 140/90 07/11/2006  . OP (osteoporosis) 07/11/2006    Past Surgical History:  Procedure Laterality Date  . CATARACT EXTRACTION, BILATERAL      Prior to Admission medications   Medication Sig Start Date End Date Taking? Authorizing Provider  acetaminophen (TYLENOL) 325 MG tablet Take 2 tablets (650 mg total) by mouth every 6 (six) hours as needed for mild pain (or Fever >/= 101). 03/16/15   Colleen Can, MD  amLODipine (NORVASC) 2.5 MG tablet Take 1 tablet (2.5 mg total) by mouth daily. 06/30/15   Steele Sizer, MD  bisacodyl (DULCOLAX) 10 MG suppository Place 1 suppository (10 mg total) rectally daily as needed for moderate constipation. 03/16/15   Colleen Can, MD  hydrocortisone (ANUSOL-HC) 25 MG suppository Place 1 suppository (25 mg total) rectally 2 (two) times daily as needed for hemorrhoids or itching. 03/16/15   Colleen Can, MD  hydrOXYzine (VISTARIL) 25 MG capsule take 1 capsule by mouth every 12 hours if needed for itching 08/17/15   Steele Sizer, MD  levothyroxine (SYNTHROID, LEVOTHROID) 75 MCG tablet take 1 tablet by mouth once daily 05/11/15  Ashok Norris, MD  LORazepam (ATIVAN) 0.5 MG tablet take 1 tablet by mouth every 6 hours if needed for anxiety 08/17/15   Steele Sizer, MD  triamcinolone cream (KENALOG) 0.1 % MIX WITH EUCERIN AT HOME AND APPLY TO RASH twice a day as directed BY PRESCRIBER 06/30/15   Steele Sizer, MD    Allergies Lamisil Reva Bores hcl] and Sulfur  Family History  Problem Relation Age of Onset  . Lymphoma Mother   .  Alzheimer's disease Mother   . Alzheimer's disease Father   . Breast cancer Sister   . Depression Daughter   . Asthma Daughter   . Schizophrenia Daughter   . Thyroid disease Daughter     Social History Social History  Substance Use Topics  . Smoking status: Never Smoker  . Smokeless tobacco: Never Used  . Alcohol use No    Review of Systems Constitutional: No fever/chills Eyes: No visual changes. ENT: No sore throat. No stiff neck no neck pain Cardiovascular: Denies chest pain. Respiratory: Denies shortness of breath. Gastrointestinal:   no vomiting.  No diarrhea.  No constipation. Genitourinary: Negative for dysuria. Musculoskeletal: Negative lower extremity swelling Skin: Negative for rash. Neurological: Negative for severe headaches, focal weakness or numbness. 10-point ROS otherwise negative.  ____________________________________________   PHYSICAL EXAM:  VITAL SIGNS: ED Triage Vitals [10/21/15 1534]  Enc Vitals Group     BP (!) 126/54     Pulse Rate 65     Resp 14     Temp 98.6 F (37 C)     Temp Source Oral     SpO2 96 %     Weight 95 lb (43.1 kg)     Height 5\' 2"  (1.575 m)     Head Circumference      Peak Flow      Pain Score      Pain Loc      Pain Edu?      Excl. in [place]?     Constitutional: Alert and oriented. Well appearing and in no acute distress. Eyes: Conjunctivae are normal. PERRL. EOMI. Head: Bruising to forehead noted with one well approximated laceration to the knee the left eyebrow and to the right forehead there is an abrasion/laceration which is approximately 3 cm long with a last centimeter open but not bleeding. It appears to be somewhat avulsed, there is obviously missing tissue as if a skin tear. Nose: No congestion/rhinnorhea. Mouth/Throat: Mucous membranes are moist.  Oropharynx non-erythematous. Neck: No stridor.   Nontender with no meningismus Cardiovascular: Normal rate, regular rhythm. Grossly normal heart sounds.  Good  peripheral circulation. Respiratory: Normal respiratory effort.  No retractions. Lungs CTAB. Chest: No rib fracture palpated lungs are clear Abdominal: Soft and nontender. No distention. No guarding no rebound Back:  There is no focal tenderness or step off.  there is no midline tenderness there are no lesions noted. there is no CVA tenderness Musculoskeletal: No lower extremity tenderness, no upper extremity tenderness. No joint effusions, no DVT signs strong distal pulses no edema Neurologic:  Normal speech and language. No gross focal neurologic deficits are appreciated.  Skin:  Skin is warm, dry see above, abrasion also noted to the back. Psychiatric: Mood and affect are normal. Speech and behavior are normal.  ____________________________________________   LABS (all labs ordered are listed, but only abnormal results are displayed)  Labs Reviewed  BASIC METABOLIC PANEL - Abnormal; Notable for the following:       Result Value   BUN 29 (*)  Creatinine, Ser 1.07 (*)    Calcium 8.8 (*)    GFR calc non Af Amer 47 (*)    GFR calc Af Amer 54 (*)    All other components within normal limits  CBC - Abnormal; Notable for the following:    RDW 16.0 (*)    All other components within normal limits  TROPONIN I  URINALYSIS COMPLETEWITH MICROSCOPIC ([facility] ONLY)  CBG MONITORING, ED   ____________________________________________  EKG  I personally interpreted any EKGs ordered by me or triage Sinus rhythm rate 72 bpm no acute ST elevation or acute ST depression normal axis ____________________________________________  RADIOLOGY  I reviewed any imaging ordered by me or triage that were performed during my shift and, if possible, patient and/or family made aware of any abnormal findings. ____________________________________________   PROCEDURES  Procedure(s) performed: None  Procedures  Critical Care performed: None  ____________________________________________   INITIAL  IMPRESSION / ASSESSMENT AND PLAN / ED COURSE  Pertinent labs & imaging results that were available during my care of the patient were reviewed by me and considered in my medical decision making (see chart for details).  Patient with a non-syncopal fall presents to emergency room with abrasions and lacerations to her head which are over 24 hours old. I cannot repair them at this time, one is well approximated, and the other is mostly an avulsion. We will clean them out. I'll give her a tetanus shot. CT is negative no evidence of acute bony injury, patient at her baseline according to family. Non-syncopal fall.  Clinical Course   ____________________________________________   FINAL CLINICAL IMPRESSION(S) / ED DIAGNOSES  Final diagnoses:  None      This chart was dictated using voice recognition software.  Despite best efforts to proofread,  errors can occur which can change meaning.      Schuyler Amor, MD 10/21/15 [phone]

## 2015-11-01 ENCOUNTER — Ambulatory Visit: Payer: Medicare Other | Admitting: Family Medicine

## 2015-11-09 DIAGNOSIS — F0281 Dementia in other diseases classified elsewhere with behavioral disturbance: Secondary | ICD-10-CM | POA: Diagnosis not present

## 2015-11-09 DIAGNOSIS — D473 Essential (hemorrhagic) thrombocythemia: Secondary | ICD-10-CM | POA: Diagnosis not present

## 2015-11-09 DIAGNOSIS — F419 Anxiety disorder, unspecified: Secondary | ICD-10-CM | POA: Diagnosis not present

## 2015-11-09 DIAGNOSIS — E039 Hypothyroidism, unspecified: Secondary | ICD-10-CM | POA: Diagnosis not present

## 2015-11-09 DIAGNOSIS — S20412D Abrasion of left back wall of thorax, subsequent encounter: Secondary | ICD-10-CM | POA: Diagnosis not present

## 2015-11-09 DIAGNOSIS — M25512 Pain in left shoulder: Secondary | ICD-10-CM | POA: Diagnosis not present

## 2015-11-09 DIAGNOSIS — S0081XD Abrasion of other part of head, subsequent encounter: Secondary | ICD-10-CM | POA: Diagnosis not present

## 2015-11-09 DIAGNOSIS — M810 Age-related osteoporosis without current pathological fracture: Secondary | ICD-10-CM | POA: Diagnosis not present

## 2015-12-27 DIAGNOSIS — F0281 Dementia in other diseases classified elsewhere with behavioral disturbance: Secondary | ICD-10-CM | POA: Diagnosis not present

## 2016-01-11 DIAGNOSIS — F0281 Dementia in other diseases classified elsewhere with behavioral disturbance: Secondary | ICD-10-CM | POA: Diagnosis not present

## 2016-01-11 DIAGNOSIS — F419 Anxiety disorder, unspecified: Secondary | ICD-10-CM | POA: Diagnosis not present

## 2016-01-11 DIAGNOSIS — D473 Essential (hemorrhagic) thrombocythemia: Secondary | ICD-10-CM | POA: Diagnosis not present

## 2016-01-11 DIAGNOSIS — E039 Hypothyroidism, unspecified: Secondary | ICD-10-CM | POA: Diagnosis not present

## 2016-01-11 DIAGNOSIS — R609 Edema, unspecified: Secondary | ICD-10-CM | POA: Diagnosis not present

## 2016-01-11 DIAGNOSIS — L309 Dermatitis, unspecified: Secondary | ICD-10-CM | POA: Diagnosis not present

## 2016-01-11 DIAGNOSIS — L0100 Impetigo, unspecified: Secondary | ICD-10-CM | POA: Diagnosis not present

## 2016-01-11 DIAGNOSIS — R079 Chest pain, unspecified: Secondary | ICD-10-CM | POA: Diagnosis not present

## 2016-01-13 ENCOUNTER — Ambulatory Visit
Admission: RE | Admit: 2016-01-13 | Discharge: 2016-01-13 | Disposition: A | Discharge status: Home / Self Care | Payer: Medicare Other | Source: Ambulatory Visit | Attending: Internal Medicine | Admitting: Internal Medicine

## 2016-01-13 ENCOUNTER — Other Ambulatory Visit: Payer: Self-pay | Admitting: Internal Medicine

## 2016-01-13 DIAGNOSIS — R609 Edema, unspecified: Secondary | ICD-10-CM

## 2016-01-13 DIAGNOSIS — R600 Localized edema: Secondary | ICD-10-CM | POA: Diagnosis not present

## 2016-01-18 DIAGNOSIS — L309 Dermatitis, unspecified: Secondary | ICD-10-CM | POA: Diagnosis not present

## 2016-01-18 DIAGNOSIS — I10 Essential (primary) hypertension: Secondary | ICD-10-CM | POA: Diagnosis not present

## 2016-01-18 DIAGNOSIS — R079 Chest pain, unspecified: Secondary | ICD-10-CM | POA: Diagnosis not present

## 2016-01-18 DIAGNOSIS — F0281 Dementia in other diseases classified elsewhere with behavioral disturbance: Secondary | ICD-10-CM | POA: Diagnosis not present

## 2016-01-18 DIAGNOSIS — R609 Edema, unspecified: Secondary | ICD-10-CM | POA: Diagnosis not present

## 2016-01-19 DIAGNOSIS — N182 Chronic kidney disease, stage 2 (mild): Secondary | ICD-10-CM | POA: Diagnosis not present

## 2016-01-19 DIAGNOSIS — E079 Disorder of thyroid, unspecified: Secondary | ICD-10-CM | POA: Diagnosis not present

## 2016-01-19 DIAGNOSIS — R0602 Shortness of breath: Secondary | ICD-10-CM | POA: Diagnosis not present

## 2016-01-19 DIAGNOSIS — R600 Localized edema: Secondary | ICD-10-CM | POA: Diagnosis not present

## 2016-01-19 DIAGNOSIS — R011 Cardiac murmur, unspecified: Secondary | ICD-10-CM | POA: Diagnosis not present

## 2016-01-19 DIAGNOSIS — R079 Chest pain, unspecified: Secondary | ICD-10-CM | POA: Diagnosis not present

## 2016-01-19 DIAGNOSIS — F419 Anxiety disorder, unspecified: Secondary | ICD-10-CM | POA: Diagnosis not present

## 2016-01-19 DIAGNOSIS — R002 Palpitations: Secondary | ICD-10-CM | POA: Diagnosis not present

## 2016-01-20 DIAGNOSIS — Z23 Encounter for immunization: Secondary | ICD-10-CM | POA: Diagnosis not present

## 2016-01-31 DIAGNOSIS — R002 Palpitations: Secondary | ICD-10-CM | POA: Diagnosis not present

## 2016-02-04 DIAGNOSIS — R0602 Shortness of breath: Secondary | ICD-10-CM | POA: Diagnosis not present

## 2016-02-04 DIAGNOSIS — R011 Cardiac murmur, unspecified: Secondary | ICD-10-CM | POA: Diagnosis not present

## 2016-02-04 DIAGNOSIS — R600 Localized edema: Secondary | ICD-10-CM | POA: Diagnosis not present

## 2016-02-09 DIAGNOSIS — N182 Chronic kidney disease, stage 2 (mild): Secondary | ICD-10-CM | POA: Diagnosis not present

## 2016-02-09 DIAGNOSIS — R079 Chest pain, unspecified: Secondary | ICD-10-CM | POA: Diagnosis not present

## 2016-02-09 DIAGNOSIS — R002 Palpitations: Secondary | ICD-10-CM | POA: Diagnosis not present

## 2016-02-09 DIAGNOSIS — E079 Disorder of thyroid, unspecified: Secondary | ICD-10-CM | POA: Diagnosis not present

## 2016-02-09 DIAGNOSIS — F419 Anxiety disorder, unspecified: Secondary | ICD-10-CM | POA: Diagnosis not present

## 2016-02-09 DIAGNOSIS — R0602 Shortness of breath: Secondary | ICD-10-CM | POA: Diagnosis not present

## 2016-02-09 DIAGNOSIS — R600 Localized edema: Secondary | ICD-10-CM | POA: Diagnosis not present

## 2016-02-09 DIAGNOSIS — R011 Cardiac murmur, unspecified: Secondary | ICD-10-CM | POA: Diagnosis not present

## 2016-04-26 DIAGNOSIS — H4052X4 Glaucoma secondary to other eye disorders, left eye, indeterminate stage: Secondary | ICD-10-CM | POA: Diagnosis not present

## 2016-04-27 DIAGNOSIS — R609 Edema, unspecified: Secondary | ICD-10-CM | POA: Diagnosis not present

## 2016-04-27 DIAGNOSIS — Z23 Encounter for immunization: Secondary | ICD-10-CM | POA: Diagnosis not present

## 2016-04-27 DIAGNOSIS — F419 Anxiety disorder, unspecified: Secondary | ICD-10-CM | POA: Diagnosis not present

## 2016-04-27 DIAGNOSIS — I10 Essential (primary) hypertension: Secondary | ICD-10-CM | POA: Diagnosis not present

## 2016-05-10 DIAGNOSIS — H5440 Blindness, one eye, unspecified eye: Secondary | ICD-10-CM | POA: Diagnosis not present

## 2016-06-26 DIAGNOSIS — R609 Edema, unspecified: Secondary | ICD-10-CM | POA: Diagnosis not present

## 2016-06-26 DIAGNOSIS — E039 Hypothyroidism, unspecified: Secondary | ICD-10-CM | POA: Diagnosis not present

## 2016-06-26 DIAGNOSIS — I10 Essential (primary) hypertension: Secondary | ICD-10-CM | POA: Diagnosis not present

## 2016-06-26 DIAGNOSIS — R32 Unspecified urinary incontinence: Secondary | ICD-10-CM | POA: Diagnosis not present

## 2016-09-27 ENCOUNTER — Emergency Department
Admission: EM | Admit: 2016-09-27 | Discharge: 2016-09-28 | Disposition: A | Discharge status: Home / Self Care | Payer: Medicare Other | Attending: Emergency Medicine | Admitting: Emergency Medicine

## 2016-09-27 ENCOUNTER — Emergency Department: Payer: Medicare Other

## 2016-09-27 ENCOUNTER — Encounter: Payer: Self-pay | Admitting: *Deleted

## 2016-09-27 DIAGNOSIS — Y999 Unspecified external cause status: Secondary | ICD-10-CM | POA: Diagnosis not present

## 2016-09-27 DIAGNOSIS — N183 Chronic kidney disease, stage 3 (moderate): Secondary | ICD-10-CM | POA: Insufficient documentation

## 2016-09-27 DIAGNOSIS — S0003XA Contusion of scalp, initial encounter: Secondary | ICD-10-CM | POA: Diagnosis not present

## 2016-09-27 DIAGNOSIS — E039 Hypothyroidism, unspecified: Secondary | ICD-10-CM | POA: Diagnosis not present

## 2016-09-27 DIAGNOSIS — R262 Difficulty in walking, not elsewhere classified: Secondary | ICD-10-CM | POA: Diagnosis not present

## 2016-09-27 DIAGNOSIS — S0101XA Laceration without foreign body of scalp, initial encounter: Secondary | ICD-10-CM | POA: Diagnosis not present

## 2016-09-27 DIAGNOSIS — Y929 Unspecified place or not applicable: Secondary | ICD-10-CM | POA: Insufficient documentation

## 2016-09-27 DIAGNOSIS — I129 Hypertensive chronic kidney disease with stage 1 through stage 4 chronic kidney disease, or unspecified chronic kidney disease: Secondary | ICD-10-CM | POA: Diagnosis not present

## 2016-09-27 DIAGNOSIS — S199XXA Unspecified injury of neck, initial encounter: Secondary | ICD-10-CM | POA: Diagnosis not present

## 2016-09-27 DIAGNOSIS — Z79899 Other long term (current) drug therapy: Secondary | ICD-10-CM | POA: Insufficient documentation

## 2016-09-27 DIAGNOSIS — Y9389 Activity, other specified: Secondary | ICD-10-CM | POA: Insufficient documentation

## 2016-09-27 DIAGNOSIS — W19XXXA Unspecified fall, initial encounter: Secondary | ICD-10-CM

## 2016-09-27 DIAGNOSIS — W1830XA Fall on same level, unspecified, initial encounter: Secondary | ICD-10-CM | POA: Diagnosis not present

## 2016-09-27 DIAGNOSIS — S0990XA Unspecified injury of head, initial encounter: Secondary | ICD-10-CM | POA: Diagnosis present

## 2016-09-27 DIAGNOSIS — R6889 Other general symptoms and signs: Secondary | ICD-10-CM | POA: Diagnosis not present

## 2016-09-27 MED ORDER — ACETAMINOPHEN 325 MG PO TABS
650.0000 mg | ORAL_TABLET | Freq: Once | ORAL | Status: AC
  Administered 2016-09-28: 650 mg via ORAL
  Filled 2016-09-27: qty 2

## 2016-09-27 NOTE — ED Provider Notes (Signed)
[facility] Emergency Department Provider Note    First MD Initiated Contact with Patient 09/27/16 2313     (approximate)  I have reviewed the triage vital signs and the nursing notes.   HISTORY  Chief Complaint Fall    HPI Tammy Mcmanamon Henry is a 83 y.o. female resents emergency department with her caregiver with history of accidental witnessed fall. Per the patient's caregiver and patient stood up stating that she needed to use the restroom without her walker and subsequently fell backwards striking her occiput. Caregiver states no loss consciousness. Patient complains of pain to the occipital portion of her head at this time. Patient denies any other complaints.   Past Medical History:  Diagnosis Date  . Acute kidney failure, unspecified ([place])   . Anxiety state, unspecified   . Cachexia ([place])   . Dehydration   . Depressive disorder, not elsewhere classified   . Early satiety   . Edema   . Essential hypertension, benign   . Functional diarrhea   . Iron deficiency anemia, unspecified   . Nonspecific abnormal electrocardiogram (ECG) (EKG)   . Osteoporosis, unspecified   . Other and unspecified disc disorder of cervical region   . Other frontotemporal dementia   . Other psoriasis   . Other specified disorders of shoulder joint   . Unspecified hypothyroidism   . Unspecified pruritic disorder   . Unspecified venous (peripheral) insufficiency   . Urinary tract infection, site not specified     Patient Active Problem List   Diagnosis Date Noted  . Intussusception intestine ([place])   . Hemorrhoid   . Intussusception of cecum ([place]) 03/11/2015  . Intussusception ([place]) 03/06/2015  . Protein-calorie malnutrition ([place]) 02/24/2015  . Cachexia ([place]) 12/28/2014  . Chronic eczema 12/28/2014  . Pain in shoulder 12/28/2014  . Chronic kidney disease (CKD), stage III (moderate) 12/28/2014  . Major depressive disorder 12/28/2014  . Dementia of frontal lobe type  12/28/2014  . History of blood transfusion 12/28/2014  . Anemia, iron deficiency 12/28/2014  . Insomnia 12/28/2014  . Itch of skin 12/28/2014  . Iron deficiency anemia 12/28/2014  . 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 Adult hypothyroidism 05/10/2009  . Varicose vein 05/10/2009  . Chronic venous insufficiency 02/06/2007  . Hypertension goal BP (blood pressure) < 140/90 07/11/2006  . OP (osteoporosis) 07/11/2006    Past Surgical History:  Procedure Laterality Date  . CATARACT EXTRACTION, BILATERAL      Prior to Admission medications   Medication Sig Start Date End Date Taking? Authorizing Provider  acetaminophen (TYLENOL) 325 MG tablet Take 2 tablets (650 mg total) by mouth every 6 (six) hours as needed for mild pain (or Fever >/= 101). 03/16/15   Colleen Can, MD  amLODipine (NORVASC) 2.5 MG tablet Take 1 tablet (2.5 mg total) by mouth daily. 06/30/15   Steele Sizer, MD  bisacodyl (DULCOLAX) 10 MG suppository Place 1 suppository (10 mg total) rectally daily as needed for moderate constipation. 03/16/15   Colleen Can, MD  hydrocortisone (ANUSOL-HC) 25 MG suppository Place 1 suppository (25 mg total) rectally 2 (two) times daily as needed for hemorrhoids or itching. 03/16/15   Colleen Can, MD  hydrOXYzine (VISTARIL) 25 MG capsule take 1 capsule by mouth every 12 hours if needed for itching 08/17/15   Steele Sizer, MD  levothyroxine (SYNTHROID, LEVOTHROID) 75 MCG tablet take 1 tablet by mouth once daily 05/11/15   Ashok Norris, MD  LORazepam (ATIVAN) 0.5 MG tablet take 1 tablet  by mouth every 6 hours if needed for anxiety 08/17/15   Steele Sizer, MD  triamcinolone cream (KENALOG) 0.1 % MIX WITH EUCERIN AT HOME AND APPLY TO RASH twice a day as directed BY PRESCRIBER 06/30/15   Steele Sizer, MD    Allergies Lamisil Reva Bores hcl] and Sulfur  Family History  Problem Relation Age of Onset  . Lymphoma Mother   . Alzheimer's disease Mother   . Alzheimer's  disease Father   . Breast cancer Sister   . Depression Daughter   . Asthma Daughter   . Schizophrenia Daughter   . Thyroid disease Daughter     Social History Social History  Substance Use Topics  . Smoking status: Never Smoker  . Smokeless tobacco: Never Used  . Alcohol use No    Review of Systems Constitutional: No fever/chills Eyes: No visual changes. ENT: No sore throat. Cardiovascular: Denies chest pain. Respiratory: Denies shortness of breath. Gastrointestinal: No abdominal pain.  No nausea, no vomiting.  No diarrhea.  No constipation. Genitourinary: Negative for dysuria. Musculoskeletal: Negative for neck pain.  Negative for back pain. Integumentary: Negative for rash. Neurological: Negative for headaches, focal weakness or numbness.   ____________________________________________   PHYSICAL EXAM:  VITAL SIGNS: ED Triage Vitals  Enc Vitals Group     BP 09/27/16 2304 (!) 171/68     Pulse Rate 09/27/16 2304 (!) 55     Resp 09/27/16 2304 16     Temp 09/27/16 2304 98 F (36.7 C)     Temp Source 09/27/16 2304 Oral     SpO2 09/27/16 2304 97 %     Weight 09/27/16 2305 45.4 kg (100 lb)     Height 09/27/16 2305 1.499 m (4\' 11" )     Head Circumference --      Peak Flow --      Pain Score 09/27/16 2303 5     Pain Loc --      Pain Edu? --      Excl. in [place]? --     Constitutional: Alert and  Well appearing and in no acute distress. Eyes: Conjunctivae are normal. Head: 5cm Stellate laceration occipital scalp Mouth/Throat: Mucous membranes are moist. Neck: No stridor.  No cervical spine tenderness to palpation. Cardiovascular: Normal rate, regular rhythm. Good peripheral circulation. Grossly normal heart sounds. Respiratory: Normal respiratory effort.  No retractions. Lungs CTAB. Gastrointestinal: Soft and nontender. No distention.  Musculoskeletal: No lower extremity tenderness nor edema. No gross deformities of extremities. Neurologic:  Normal speech and  language. No gross focal neurologic deficits are appreciated.  Skin:  4 cm stellate laceration occipital scalp Psychiatric: Mood and affect are normal. Speech and behavior are normal.    RADIOLOGY I, [place] Shyhiem Beeney, personally viewed and evaluated these images (plain radiographs) as part of my medical decision making, as well as reviewing the written report by the radiologist.  Ct Head Wo Contrast  Result Date: 09/27/2016 CLINICAL DATA:  [place] backwards, striking the back of her head while ambulating without her walker tonight. EXAM: CT HEAD WITHOUT CONTRAST CT CERVICAL SPINE WITHOUT CONTRAST TECHNIQUE: Multidetector CT imaging of the head and cervical spine was performed following the standard protocol without intravenous contrast. Multiplanar CT image reconstructions of the cervical spine were also generated. COMPARISON:  10/21/2015 FINDINGS: CT HEAD FINDINGS Brain: There is no intracranial hemorrhage, mass or evidence of acute infarction. There is moderate generalized atrophy. There is moderate chronic microvascular ischemic change. There is no significant extra-axial fluid collection. No acute intracranial findings  are evident. Vascular: No hyperdense vessel or unexpected calcification. Skull: Normal. Negative for fracture or focal lesion. Sinuses/Orbits: No acute finding. Other: Right occipital scalp hematoma. CT CERVICAL SPINE FINDINGS Alignment: No traumatic malalignment. Slight degenerative spondylolisthesis at C4-5, C5-6 and C6-7. Skull base and vertebrae: No acute fracture. No primary bone lesion or focal pathologic process. Soft tissues and spinal canal: No prevertebral fluid or swelling. No visible canal hematoma. Disc levels: Mild degenerative disc changes from C4 through C7. Moderate facet arthritis. Facet articulations are intact. Upper chest: No acute findings. Other: None IMPRESSION: 1. No acute intracranial findings. There is moderate generalized atrophy and chronic appearing white  matter hypodensities which likely represent small vessel ischemic disease. 2. Negative for acute cervical spine fracture. 3. Right occipital scalp hematoma. Electronically Signed   By: Andreas Newport M.D.   On: 09/27/2016 23:58   Ct Cervical Spine Wo Contrast  Result Date: 09/27/2016 CLINICAL DATA:  [place] backwards, striking the back of her head while ambulating without her walker tonight. EXAM: CT HEAD WITHOUT CONTRAST CT CERVICAL SPINE WITHOUT CONTRAST TECHNIQUE: Multidetector CT imaging of the head and cervical spine was performed following the standard protocol without intravenous contrast. Multiplanar CT image reconstructions of the cervical spine were also generated. COMPARISON:  10/21/2015 FINDINGS: CT HEAD FINDINGS Brain: There is no intracranial hemorrhage, mass or evidence of acute infarction. There is moderate generalized atrophy. There is moderate chronic microvascular ischemic change. There is no significant extra-axial fluid collection. No acute intracranial findings are evident. Vascular: No hyperdense vessel or unexpected calcification. Skull: Normal. Negative for fracture or focal lesion. Sinuses/Orbits: No acute finding. Other: Right occipital scalp hematoma. CT CERVICAL SPINE FINDINGS Alignment: No traumatic malalignment. Slight degenerative spondylolisthesis at C4-5, C5-6 and C6-7. Skull base and vertebrae: No acute fracture. No primary bone lesion or focal pathologic process. Soft tissues and spinal canal: No prevertebral fluid or swelling. No visible canal hematoma. Disc levels: Mild degenerative disc changes from C4 through C7. Moderate facet arthritis. Facet articulations are intact. Upper chest: No acute findings. Other: None IMPRESSION: 1. No acute intracranial findings. There is moderate generalized atrophy and chronic appearing white matter hypodensities which likely represent small vessel ischemic disease. 2. Negative for acute cervical spine fracture. 3. Right occipital scalp  hematoma. Electronically Signed   By: Andreas Newport M.D.   On: 09/27/2016 23:58       .Marland KitchenLaceration Repair Date/Time: 09/28/2016 7:41 AM Performed by: Gregor Hams Authorized by: Gregor Hams   Consent:    Consent obtained:  Verbal   Consent given by:  Healthcare agent   Risks discussed:  Pain and infection   Alternatives discussed:  No treatment Anesthesia (see MAR for exact dosages):    Anesthesia method:  Local infiltration   Local anesthetic:  Lidocaine 1% WITH epi Laceration details:    Location:  Scalp   Scalp location:  Occipital   Length (cm):  4   Depth (mm):  5 Repair type:    Repair type:  Simple Pre-procedure details:    Preparation:  Patient was prepped and draped in usual sterile fashion Exploration:    Contaminated: no   Treatment:    Area cleansed with:  Betadine and saline   Amount of cleaning:  Standard   Visualized foreign bodies/material removed: no   Skin repair:    Repair method:  Staples Approximation:    Approximation:  Close Post-procedure details:    Dressing:  Open (no dressing)     ____________________________________________   INITIAL  IMPRESSION / ASSESSMENT AND PLAN / ED COURSE  Pertinent labs & imaging results that were available during my care of the patient were reviewed by me and considered in my medical decision making (see chart for details).        ____________________________________________  FINAL CLINICAL IMPRESSION(S) / ED DIAGNOSES  Final diagnoses:  Laceration of occipital scalp, initial encounter  Fall, initial encounter     MEDICATIONS GIVEN DURING THIS VISIT:  Medications  lidocaine-EPINEPHrine (XYLOCAINE W/EPI) 2 %-1:100000 (with pres) injection (not administered)  acetaminophen (TYLENOL) tablet 650 mg (650 mg Oral Given 09/28/16 0029)     NEW OUTPATIENT MEDICATIONS STARTED DURING THIS VISIT:  New Prescriptions   No medications on file    Modified Medications   No medications on  file    Discontinued Medications   No medications on file     Note:  This document was prepared using Dragon voice recognition software and [place] include unintentional dictation errors.    Gregor Hams, MD 09/28/16 [phone]

## 2016-09-27 NOTE — ED Triage Notes (Signed)
Per EMS report, patient stood up without her walker and fell backwards and hit the back of her head. No LOC.

## 2016-09-27 NOTE — ED Notes (Signed)
Patient transported to CT 

## 2016-09-27 NOTE — ED Notes (Signed)
Pt. Has returned from CT.

## 2016-09-27 NOTE — ED Notes (Signed)
Triage level increased from 4 to 3 due to services.

## 2016-09-28 DIAGNOSIS — R6889 Other general symptoms and signs: Secondary | ICD-10-CM | POA: Diagnosis not present

## 2016-09-28 DIAGNOSIS — Z7401 Bed confinement status: Secondary | ICD-10-CM | POA: Diagnosis not present

## 2016-09-28 DIAGNOSIS — R531 Weakness: Secondary | ICD-10-CM | POA: Diagnosis not present

## 2016-09-28 MED ORDER — LIDOCAINE-EPINEPHRINE 2 %-1:100000 IJ SOLN
INTRAMUSCULAR | Status: AC
  Filled 2016-09-28: qty 1.7

## 2016-09-28 NOTE — ED Notes (Signed)
Pt. Going home via [facility].

## 2016-09-28 NOTE — ED Notes (Signed)
Pt. Needs ambulance ride back to home.  Caregiver unable to take patient home safely.  Caregiver gave address of [address]

## 2016-10-02 DIAGNOSIS — R609 Edema, unspecified: Secondary | ICD-10-CM | POA: Diagnosis not present

## 2016-10-02 DIAGNOSIS — Z13220 Encounter for screening for lipoid disorders: Secondary | ICD-10-CM | POA: Diagnosis not present

## 2016-10-02 DIAGNOSIS — I10 Essential (primary) hypertension: Secondary | ICD-10-CM | POA: Diagnosis not present

## 2016-10-02 DIAGNOSIS — F0281 Dementia in other diseases classified elsewhere with behavioral disturbance: Secondary | ICD-10-CM | POA: Diagnosis not present

## 2016-10-02 DIAGNOSIS — E039 Hypothyroidism, unspecified: Secondary | ICD-10-CM | POA: Diagnosis not present

## 2016-10-04 DIAGNOSIS — S0502XA Injury of conjunctiva and corneal abrasion without foreign body, left eye, initial encounter: Secondary | ICD-10-CM | POA: Diagnosis not present

## 2016-10-09 DIAGNOSIS — R609 Edema, unspecified: Secondary | ICD-10-CM | POA: Diagnosis not present

## 2016-10-09 DIAGNOSIS — I10 Essential (primary) hypertension: Secondary | ICD-10-CM | POA: Diagnosis not present

## 2016-10-09 DIAGNOSIS — Z13220 Encounter for screening for lipoid disorders: Secondary | ICD-10-CM | POA: Diagnosis not present

## 2016-10-09 DIAGNOSIS — R946 Abnormal results of thyroid function studies: Secondary | ICD-10-CM | POA: Diagnosis not present

## 2016-10-09 DIAGNOSIS — F0390 Unspecified dementia without behavioral disturbance: Secondary | ICD-10-CM | POA: Diagnosis not present

## 2016-10-09 DIAGNOSIS — E039 Hypothyroidism, unspecified: Secondary | ICD-10-CM | POA: Diagnosis not present

## 2016-10-09 DIAGNOSIS — F0281 Dementia in other diseases classified elsewhere with behavioral disturbance: Secondary | ICD-10-CM | POA: Diagnosis not present

## 2016-10-17 DIAGNOSIS — H18899 Other specified disorders of cornea, unspecified eye: Secondary | ICD-10-CM | POA: Diagnosis not present

## 2016-10-18 DIAGNOSIS — N390 Urinary tract infection, site not specified: Secondary | ICD-10-CM | POA: Diagnosis not present

## 2016-10-31 DIAGNOSIS — H18899 Other specified disorders of cornea, unspecified eye: Secondary | ICD-10-CM | POA: Diagnosis not present

## 2016-11-29 DIAGNOSIS — F0281 Dementia in other diseases classified elsewhere with behavioral disturbance: Secondary | ICD-10-CM | POA: Diagnosis not present

## 2016-12-04 DIAGNOSIS — H18899 Other specified disorders of cornea, unspecified eye: Secondary | ICD-10-CM | POA: Diagnosis not present

## 2016-12-15 DIAGNOSIS — F419 Anxiety disorder, unspecified: Secondary | ICD-10-CM | POA: Diagnosis not present

## 2016-12-15 DIAGNOSIS — I10 Essential (primary) hypertension: Secondary | ICD-10-CM | POA: Diagnosis not present

## 2016-12-15 DIAGNOSIS — N3090 Cystitis, unspecified without hematuria: Secondary | ICD-10-CM | POA: Diagnosis not present

## 2016-12-15 DIAGNOSIS — R609 Edema, unspecified: Secondary | ICD-10-CM | POA: Diagnosis not present

## 2016-12-18 DIAGNOSIS — Z4802 Encounter for removal of sutures: Secondary | ICD-10-CM | POA: Diagnosis not present

## 2016-12-18 DIAGNOSIS — I10 Essential (primary) hypertension: Secondary | ICD-10-CM | POA: Diagnosis not present

## 2016-12-18 DIAGNOSIS — E039 Hypothyroidism, unspecified: Secondary | ICD-10-CM | POA: Diagnosis not present

## 2016-12-18 DIAGNOSIS — N390 Urinary tract infection, site not specified: Secondary | ICD-10-CM | POA: Diagnosis not present

## 2016-12-18 DIAGNOSIS — E782 Mixed hyperlipidemia: Secondary | ICD-10-CM | POA: Diagnosis not present

## 2016-12-25 DIAGNOSIS — H5440 Blindness, one eye, unspecified eye: Secondary | ICD-10-CM | POA: Diagnosis not present

## 2017-01-14 ENCOUNTER — Encounter: Payer: Self-pay | Admitting: Emergency Medicine

## 2017-01-14 ENCOUNTER — Emergency Department
Admission: EM | Admit: 2017-01-14 | Discharge: 2017-01-14 | Disposition: A | Discharge status: Home / Self Care | Payer: Medicare Other | Attending: Emergency Medicine | Admitting: Emergency Medicine

## 2017-01-14 ENCOUNTER — Other Ambulatory Visit: Payer: Self-pay

## 2017-01-14 DIAGNOSIS — Y9389 Activity, other specified: Secondary | ICD-10-CM | POA: Diagnosis not present

## 2017-01-14 DIAGNOSIS — Z79899 Other long term (current) drug therapy: Secondary | ICD-10-CM | POA: Insufficient documentation

## 2017-01-14 DIAGNOSIS — Y929 Unspecified place or not applicable: Secondary | ICD-10-CM | POA: Diagnosis not present

## 2017-01-14 DIAGNOSIS — S51812A Laceration without foreign body of left forearm, initial encounter: Secondary | ICD-10-CM | POA: Diagnosis not present

## 2017-01-14 DIAGNOSIS — Y33XXXA Other specified events, undetermined intent, initial encounter: Secondary | ICD-10-CM | POA: Insufficient documentation

## 2017-01-14 DIAGNOSIS — I129 Hypertensive chronic kidney disease with stage 1 through stage 4 chronic kidney disease, or unspecified chronic kidney disease: Secondary | ICD-10-CM | POA: Insufficient documentation

## 2017-01-14 DIAGNOSIS — N183 Chronic kidney disease, stage 3 (moderate): Secondary | ICD-10-CM | POA: Insufficient documentation

## 2017-01-14 DIAGNOSIS — G309 Alzheimer's disease, unspecified: Secondary | ICD-10-CM | POA: Insufficient documentation

## 2017-01-14 DIAGNOSIS — S51802A Unspecified open wound of left forearm, initial encounter: Secondary | ICD-10-CM | POA: Diagnosis not present

## 2017-01-14 DIAGNOSIS — Y998 Other external cause status: Secondary | ICD-10-CM | POA: Insufficient documentation
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
## 2017-01-14 NOTE — ED Notes (Addendum)
Pt went to [facility] today, they referred her to ED. Pt's daughter states they said they saw yellow fatty tissue and pt [place] need skin graph.Pt got skin tear working with caregiver about midnight last night. Pt has a hx of AD and can be combative at times. Pt is NAD awaiting EDP

## 2017-01-14 NOTE — Discharge Instructions (Signed)
Leave the dressing in place for 24 hours. Change the dressing daily. Do not apply any type of ointments or creams to the affected area. Call to schedule a follow-up appointment with wound care. If you are unable to get an appointment with the wound care center, please schedule an appointment with primary care.  Return to the emergency department for any symptoms of concern if you're unable to schedule an appointment.

## 2017-01-18 ENCOUNTER — Encounter: Payer: Self-pay | Admitting: Emergency Medicine

## 2017-01-18 ENCOUNTER — Inpatient Hospital Stay
Admission: EM | Admit: 2017-01-18 | Discharge: 2017-01-24 | DRG: 603 | Disposition: A | Discharge status: Skilled Nursing Facility | Payer: Medicare Other | Attending: Internal Medicine | Admitting: Internal Medicine

## 2017-01-18 ENCOUNTER — Other Ambulatory Visit: Payer: Self-pay

## 2017-01-18 DIAGNOSIS — Z7401 Bed confinement status: Secondary | ICD-10-CM | POA: Diagnosis not present

## 2017-01-18 DIAGNOSIS — R64 Cachexia: Secondary | ICD-10-CM | POA: Diagnosis present

## 2017-01-18 DIAGNOSIS — L03114 Cellulitis of left upper limb: Secondary | ICD-10-CM | POA: Diagnosis not present

## 2017-01-18 DIAGNOSIS — K644 Residual hemorrhoidal skin tags: Secondary | ICD-10-CM | POA: Diagnosis present

## 2017-01-18 DIAGNOSIS — R1312 Dysphagia, oropharyngeal phase: Secondary | ICD-10-CM | POA: Diagnosis not present

## 2017-01-18 DIAGNOSIS — Z8673 Personal history of transient ischemic attack (TIA), and cerebral infarction without residual deficits: Secondary | ICD-10-CM

## 2017-01-18 DIAGNOSIS — E860 Dehydration: Secondary | ICD-10-CM | POA: Diagnosis present

## 2017-01-18 DIAGNOSIS — F329 Major depressive disorder, single episode, unspecified: Secondary | ICD-10-CM | POA: Diagnosis present

## 2017-01-18 DIAGNOSIS — G3109 Other frontotemporal dementia: Secondary | ICD-10-CM | POA: Diagnosis present

## 2017-01-18 DIAGNOSIS — F0281 Dementia in other diseases classified elsewhere with behavioral disturbance: Secondary | ICD-10-CM | POA: Diagnosis present

## 2017-01-18 DIAGNOSIS — B9562 Methicillin resistant Staphylococcus aureus infection as the cause of diseases classified elsewhere: Secondary | ICD-10-CM | POA: Diagnosis present

## 2017-01-18 DIAGNOSIS — L0390 Cellulitis, unspecified: Secondary | ICD-10-CM | POA: Diagnosis not present

## 2017-01-18 DIAGNOSIS — N183 Chronic kidney disease, stage 3 (moderate): Secondary | ICD-10-CM | POA: Diagnosis present

## 2017-01-18 DIAGNOSIS — Z7189 Other specified counseling: Secondary | ICD-10-CM | POA: Diagnosis not present

## 2017-01-18 DIAGNOSIS — B9620 Unspecified Escherichia coli [E. coli] as the cause of diseases classified elsewhere: Secondary | ICD-10-CM | POA: Diagnosis present

## 2017-01-18 DIAGNOSIS — M810 Age-related osteoporosis without current pathological fracture: Secondary | ICD-10-CM | POA: Diagnosis present

## 2017-01-18 DIAGNOSIS — D649 Anemia, unspecified: Secondary | ICD-10-CM | POA: Diagnosis present

## 2017-01-18 DIAGNOSIS — R633 Feeding difficulties: Secondary | ICD-10-CM | POA: Diagnosis not present

## 2017-01-18 DIAGNOSIS — K649 Unspecified hemorrhoids: Secondary | ICD-10-CM | POA: Diagnosis not present

## 2017-01-18 DIAGNOSIS — R636 Underweight: Secondary | ICD-10-CM | POA: Diagnosis present

## 2017-01-18 DIAGNOSIS — R278 Other lack of coordination: Secondary | ICD-10-CM | POA: Diagnosis not present

## 2017-01-18 DIAGNOSIS — Z888 Allergy status to other drugs, medicaments and biological substances status: Secondary | ICD-10-CM

## 2017-01-18 DIAGNOSIS — Z9181 History of falling: Secondary | ICD-10-CM

## 2017-01-18 DIAGNOSIS — R4182 Altered mental status, unspecified: Secondary | ICD-10-CM | POA: Diagnosis not present

## 2017-01-18 DIAGNOSIS — Z79899 Other long term (current) drug therapy: Secondary | ICD-10-CM

## 2017-01-18 DIAGNOSIS — I129 Hypertensive chronic kidney disease with stage 1 through stage 4 chronic kidney disease, or unspecified chronic kidney disease: Secondary | ICD-10-CM | POA: Diagnosis present

## 2017-01-18 DIAGNOSIS — F411 Generalized anxiety disorder: Secondary | ICD-10-CM | POA: Diagnosis present

## 2017-01-18 DIAGNOSIS — F419 Anxiety disorder, unspecified: Secondary | ICD-10-CM | POA: Diagnosis not present

## 2017-01-18 DIAGNOSIS — N390 Urinary tract infection, site not specified: Secondary | ICD-10-CM | POA: Diagnosis not present

## 2017-01-18 DIAGNOSIS — E785 Hyperlipidemia, unspecified: Secondary | ICD-10-CM | POA: Diagnosis present

## 2017-01-18 DIAGNOSIS — G3183 Dementia with Lewy bodies: Secondary | ICD-10-CM | POA: Diagnosis not present

## 2017-01-18 DIAGNOSIS — F3289 Other specified depressive episodes: Secondary | ICD-10-CM | POA: Diagnosis not present

## 2017-01-18 DIAGNOSIS — N3000 Acute cystitis without hematuria: Secondary | ICD-10-CM | POA: Diagnosis not present

## 2017-01-18 DIAGNOSIS — Z5189 Encounter for other specified aftercare: Secondary | ICD-10-CM | POA: Diagnosis not present

## 2017-01-18 DIAGNOSIS — Z818 Family history of other mental and behavioral disorders: Secondary | ICD-10-CM

## 2017-01-18 DIAGNOSIS — E569 Vitamin deficiency, unspecified: Secondary | ICD-10-CM | POA: Diagnosis not present

## 2017-01-18 DIAGNOSIS — G309 Alzheimer's disease, unspecified: Secondary | ICD-10-CM | POA: Diagnosis not present

## 2017-01-18 DIAGNOSIS — I872 Venous insufficiency (chronic) (peripheral): Secondary | ICD-10-CM | POA: Diagnosis present

## 2017-01-18 DIAGNOSIS — Z23 Encounter for immunization: Secondary | ICD-10-CM | POA: Diagnosis not present

## 2017-01-18 DIAGNOSIS — Z66 Do not resuscitate: Secondary | ICD-10-CM | POA: Diagnosis not present

## 2017-01-18 DIAGNOSIS — R627 Adult failure to thrive: Secondary | ICD-10-CM | POA: Diagnosis present

## 2017-01-18 DIAGNOSIS — E039 Hypothyroidism, unspecified: Secondary | ICD-10-CM | POA: Diagnosis present

## 2017-01-18 DIAGNOSIS — I10 Essential (primary) hypertension: Secondary | ICD-10-CM | POA: Diagnosis not present

## 2017-01-18 DIAGNOSIS — G4709 Other insomnia: Secondary | ICD-10-CM | POA: Diagnosis not present

## 2017-01-18 DIAGNOSIS — M6281 Muscle weakness (generalized): Secondary | ICD-10-CM | POA: Diagnosis not present

## 2017-01-18 DIAGNOSIS — L409 Psoriasis, unspecified: Secondary | ICD-10-CM | POA: Diagnosis not present

## 2017-01-18 DIAGNOSIS — F0151 Vascular dementia with behavioral disturbance: Secondary | ICD-10-CM | POA: Diagnosis not present

## 2017-01-18 LAB — COMPREHENSIVE METABOLIC PANEL
ALBUMIN: 3 g/dL — AB (ref 3.5–5.0)
ALT: 11 U/L — ABNORMAL LOW (ref 14–54)
AST: 15 U/L (ref 15–41)
Alkaline Phosphatase: 76 U/L (ref 38–126)
Anion gap: 9 (ref 5–15)
BUN: 22 mg/dL — AB (ref 6–20)
CO2: 28 mmol/L (ref 22–32)
Calcium: 8.8 mg/dL — ABNORMAL LOW (ref 8.9–10.3)
Chloride: 105 mmol/L (ref 101–111)
Creatinine, Ser: 1.03 mg/dL — ABNORMAL HIGH (ref 0.44–1.00)
GFR, EST AFRICAN AMERICAN: 56 mL/min — AB (ref >60)
GFR, EST NON AFRICAN AMERICAN: 48 mL/min — AB (ref >60)
Glucose, Bld: 98 mg/dL (ref 65–99)
POTASSIUM: 4.2 mmol/L (ref 3.5–5.1)
Sodium: 142 mmol/L (ref 135–145)
Total Bilirubin: 0.5 mg/dL (ref 0.3–1.2)
Total Protein: 6.5 g/dL (ref 6.5–8.1)

## 2017-01-18 LAB — CBC WITH DIFFERENTIAL/PLATELET
BASOS ABS: 0 10*3/uL (ref 0–0.1)
BLASTS: 0 %
Band Neutrophils: 2 %
Basophils Relative: 0 %
Eosinophils Absolute: 0.6 10*3/uL (ref 0–0.7)
Eosinophils Relative: 7 %
HEMATOCRIT: 40.8 % (ref 35.0–47.0)
Hemoglobin: 13.5 g/dL (ref 12.0–16.0)
Lymphocytes Relative: 16 %
Lymphs Abs: 1.3 10*3/uL (ref 1.0–3.6)
MCH: 29.1 pg (ref 26.0–34.0)
MCHC: 33.2 g/dL (ref 32.0–36.0)
MCV: 87.7 fL (ref 80.0–100.0)
METAMYELOCYTES PCT: 0 %
MONOS PCT: 3 %
MYELOCYTES: 0 %
Monocytes Absolute: 0.2 10*3/uL (ref 0.2–0.9)
NEUTROS ABS: 6.1 10*3/uL (ref 1.4–6.5)
NRBC: 0 /100{WBCs}
Neutrophils Relative %: 72 %
Other: 0 %
Platelets: 432 10*3/uL (ref 150–440)
Promyelocytes Absolute: 0 %
RBC: 4.65 MIL/uL (ref 3.80–5.20)
RDW: 20 % — ABNORMAL HIGH (ref 11.5–14.5)
WBC: 8.2 10*3/uL (ref 3.6–11.0)

## 2017-01-18 LAB — LACTIC ACID, PLASMA
Lactic Acid, Venous: 1.1 mmol/L (ref 0.5–1.9)
Lactic Acid, Venous: 1.5 mmol/L (ref 0.5–1.9)

## 2017-01-18 MED ORDER — ACETAMINOPHEN 325 MG PO TABS
650.0000 mg | ORAL_TABLET | Freq: Four times a day (QID) | ORAL | Status: DC | PRN
  Administered 2017-01-18 – 2017-01-23 (×8): 650 mg via ORAL
  Filled 2017-01-18 (×9): qty 2

## 2017-01-18 MED ORDER — DIVALPROEX SODIUM 125 MG PO CSDR
250.0000 mg | DELAYED_RELEASE_CAPSULE | Freq: Two times a day (BID) | ORAL | Status: DC
  Administered 2017-01-18 – 2017-01-23 (×11): 250 mg via ORAL
  Filled 2017-01-18 (×12): qty 2

## 2017-01-18 MED ORDER — ATORVASTATIN CALCIUM 20 MG PO TABS
20.0000 mg | ORAL_TABLET | Freq: Every day | ORAL | Status: DC
  Administered 2017-01-18 – 2017-01-23 (×6): 20 mg via ORAL
  Filled 2017-01-18 (×6): qty 1

## 2017-01-18 MED ORDER — VANCOMYCIN HCL IN DEXTROSE 1-5 GM/200ML-% IV SOLN
1000.0000 mg | Freq: Once | INTRAVENOUS | Status: AC
  Administered 2017-01-18: 1000 mg via INTRAVENOUS
  Filled 2017-01-18: qty 200

## 2017-01-18 MED ORDER — HYDROXYZINE HCL 25 MG PO TABS
25.0000 mg | ORAL_TABLET | Freq: Two times a day (BID) | ORAL | Status: DC
  Administered 2017-01-18 – 2017-01-24 (×12): 25 mg via ORAL
  Filled 2017-01-18 (×12): qty 1

## 2017-01-18 MED ORDER — LEVOTHYROXINE SODIUM 50 MCG PO TABS
50.0000 ug | ORAL_TABLET | Freq: Every day | ORAL | Status: DC
  Administered 2017-01-19 – 2017-01-24 (×6): 50 ug via ORAL
  Filled 2017-01-18 (×6): qty 1

## 2017-01-18 MED ORDER — BACITRACIN ZINC 500 UNIT/GM EX OINT
TOPICAL_OINTMENT | Freq: Once | CUTANEOUS | Status: AC
  Administered 2017-01-18: 18:00:00 via TOPICAL

## 2017-01-18 MED ORDER — VITAMIN D 1000 UNITS PO TABS
1000.0000 [IU] | ORAL_TABLET | Freq: Every day | ORAL | Status: DC
  Administered 2017-01-19 – 2017-01-23 (×4): 1000 [IU] via ORAL
  Filled 2017-01-18 (×4): qty 1

## 2017-01-18 MED ORDER — ENOXAPARIN SODIUM 30 MG/0.3ML ~~LOC~~ SOLN
30.0000 mg | SUBCUTANEOUS | Status: DC
  Administered 2017-01-18 – 2017-01-23 (×6): 30 mg via SUBCUTANEOUS
  Filled 2017-01-18 (×6): qty 0.3

## 2017-01-18 MED ORDER — TEMAZEPAM 15 MG PO CAPS
15.0000 mg | ORAL_CAPSULE | Freq: Every evening | ORAL | Status: DC | PRN
  Administered 2017-01-19 – 2017-01-24 (×5): 15 mg via ORAL
  Filled 2017-01-18 (×5): qty 1

## 2017-01-18 MED ORDER — AMLODIPINE BESYLATE 5 MG PO TABS
5.0000 mg | ORAL_TABLET | Freq: Every day | ORAL | Status: DC
  Administered 2017-01-19 – 2017-01-20 (×2): 5 mg via ORAL
  Filled 2017-01-18 (×4): qty 1

## 2017-01-18 MED ORDER — CALCIUM-VITAMIN D 500-200 MG-UNIT PO TABS
1.0000 | ORAL_TABLET | Freq: Every day | ORAL | Status: DC
  Administered 2017-01-19 – 2017-01-23 (×4): 1 via ORAL
  Filled 2017-01-18 (×6): qty 1

## 2017-01-18 MED ORDER — CITALOPRAM HYDROBROMIDE 10 MG PO TABS
10.0000 mg | ORAL_TABLET | ORAL | Status: DC
  Administered 2017-01-19 – 2017-01-24 (×6): 10 mg via ORAL
  Filled 2017-01-18 (×7): qty 1

## 2017-01-18 MED ORDER — BACITRACIN ZINC 500 UNIT/GM EX OINT
TOPICAL_OINTMENT | CUTANEOUS | Status: AC
Start: 2017-01-18 — End: 2017-01-18
  Filled 2017-01-18: qty 1.8

## 2017-01-18 MED ORDER — ACETAMINOPHEN 650 MG RE SUPP: 650.0000 mg | Freq: Four times a day (QID) | RECTAL | Status: DC | PRN

## 2017-01-18 MED ORDER — HYDROCORTISONE 2.5 % RE CREA
TOPICAL_CREAM | Freq: Four times a day (QID) | RECTAL | Status: DC
  Administered 2017-01-18 – 2017-01-23 (×16): via RECTAL
  Filled 2017-01-18 (×2): qty 28.35

## 2017-01-18 MED ORDER — ENOXAPARIN SODIUM 40 MG/0.4ML ~~LOC~~ SOLN: 40.0000 mg | SUBCUTANEOUS | Status: DC

## 2017-01-18 MED ORDER — VANCOMYCIN HCL 500 MG IV SOLR
500.0000 mg | INTRAVENOUS | Status: DC
  Administered 2017-01-19 – 2017-01-20 (×2): 500 mg via INTRAVENOUS
  Filled 2017-01-18 (×3): qty 500

## 2017-01-18 MED ORDER — SODIUM CHLORIDE 0.9 % IV SOLN
INTRAVENOUS | Status: DC
  Administered 2017-01-18 – 2017-01-23 (×5): via INTRAVENOUS

## 2017-01-18 NOTE — ED Notes (Signed)
Vancomycin paused for approx 15 mins for IV site change. Restarted and running at this point.

## 2017-01-18 NOTE — Progress Notes (Signed)
Anticoagulation monitoring(Lovenox):  83 yo  female ordered Lovenox 40 mg Q24h for DVT prevention.  Filed Weights   01/18/17 1705  Weight: 100 lb (45.4 kg)     Lab Results  Component Value Date   CREATININE 1.03 (H) 01/18/2017   CREATININE 1.07 (H) 10/21/2015   CREATININE 0.93 06/30/2015   Estimated Creatinine Clearance: 28.6 mL/min (A) (by C-G formula based on SCr of 1.03 mg/dL (H)). Hemoglobin & Hematocrit     Component Value Date/Time   HGB 13.5 01/18/2017 1703   HGB 12.5 06/30/2015 1553   HCT 40.8 01/18/2017 1703   HCT 39.5 06/30/2015 1553     Per Protocol for Patient with estCrcl < 30 ml/min and BMI < 40, will transition to Lovenox 30mg  Q24h.     Paulina Fusi, PharmD, BCPS 01/18/2017 8:37 PM

## 2017-01-18 NOTE — ED Notes (Signed)
Second lactic not drawn yet due to lack of access and only line running vancomycin at this time

## 2017-01-18 NOTE — ED Triage Notes (Signed)
Pt in via POV from home with cellulitis to left arm, pt with recent skin tear, seen here on Sunday with some of tear "surgically glued back together" per family.  Pt with new redness, swelling, warmth to left arm and clear yellow exodus from site.  Pt afebrile, vitals WDL, NAD noted a this time.

## 2017-01-18 NOTE — Progress Notes (Signed)
Pharmacy Antibiotic Note  Tammy Henry is a 83 y.o. female admitted on 01/18/2017 with cellulitis.  Pharmacy has been consulted for vancomycin dosing.  Plan: Ke= 0.028 h-1 Vd= 31.8 L  Vancomycin 1000 mg iv once given in ED. Will initiate vancomycin 500 IV every 24 hours.  Goal trough 10-15 mcg/mL. Will check levels/adjust dosing as clinically indicated.   Height: 5\' 4"  (162.6 cm) Weight: 100 lb (45.4 kg) IBW/kg (Calculated) : 54.7  Temp (24hrs), Avg:98.2 F (36.8 C), Min:98.2 F (36.8 C), Max:98.2 F (36.8 C)  Recent Labs  Lab 01/18/17 1703  WBC 8.2  CREATININE 1.03*  LATICACIDVEN 1.1    Estimated Creatinine Clearance: 28.6 mL/min (A) (by C-G formula based on SCr of 1.03 mg/dL (H)).    Allergies  Allergen Reactions  . Lamisil [Terbinafine] Nausea And Vomiting  . Sulfur Nausea And Vomiting    Antimicrobials this admission: Vancomycin 11/15 >>   Dose adjustments this admission:   Microbiology results:  Thank you for allowing pharmacy to be a part of this patient's care.  Napoleon Form 01/18/2017 7:46 PM

## 2017-01-18 NOTE — H&P (Addendum)
[place] [facility] [place] NAME: Tammy Henry    MR#:  [id number]  DATE OF BIRTH:  1930-03-01  DATE OF ADMISSION:  01/18/2017  PRIMARY CARE PHYSICIAN: Perrin Maltese, MD   REQUESTING/REFERRING PHYSICIAN: Dr Merlyn Lot  CHIEF COMPLAINT:   Chief Complaint  Patient presents with  . Cellulitis    HISTORY OF PRESENT ILLNESS:  Tammy Henry  is a 83 y.o. female who recently had a skin tear on Sunday.  Family states that a little glue was used to keep it together.  Area started getting more inflamed and red.  Patient had a low-grade temperature.  Family noticed she has been drinking less recently.  They also noticed a foul smell in her urine.  Patient is a poor historian secondary to dementia.  Hospitalist services contacted for cellulitis of the left arm.  PAST MEDICAL HISTORY:   Past Medical History:  Diagnosis Date  . Acute kidney failure, unspecified ([place])   . Anxiety state, unspecified   . Cachexia ([place])   . Dehydration   . Depressive disorder, not elsewhere classified   . Early satiety   . Edema   . Essential hypertension, benign   . Functional diarrhea   . Iron deficiency anemia, unspecified   . Nonspecific abnormal electrocardiogram (ECG) (EKG)   . Osteoporosis, unspecified   . Other and unspecified disc disorder of cervical region   . Other frontotemporal dementia   . Other psoriasis   . Other specified disorders of shoulder joint   . Unspecified hypothyroidism   . Unspecified pruritic disorder   . Unspecified venous (peripheral) insufficiency   . Urinary tract infection, site not specified     PAST SURGICAL HISTORY:   Past Surgical History:  Procedure Laterality Date  . CATARACT EXTRACTION, BILATERAL      SOCIAL HISTORY:   Social History   Tobacco Use  . Smoking status: Never Smoker  . Smokeless tobacco: Never Used  Substance Use Topics  . Alcohol use: No    FAMILY HISTORY:   Family History   Problem Relation Age of Onset  . Lymphoma Mother   . Alzheimer's disease Mother   . Alzheimer's disease Father   . Breast cancer Sister   . Depression Daughter   . Asthma Daughter   . Schizophrenia Daughter   . Thyroid disease Daughter     DRUG ALLERGIES:   Allergies  Allergen Reactions  . Lamisil [Terbinafine] Nausea And Vomiting  . Sulfur Nausea And Vomiting    REVIEW OF SYSTEMS:  Limited with dementia but with help of family able to answer some questions.  CONSTITUTIONAL: Positive for fever.  EYES: Legally blind left eye EARS, NOSE, AND THROAT: No sore throat RESPIRATORY: No shortness of breath CARDIOVASCULAR: No chest pain.  GASTROINTESTINAL: No abdominal pain.  Family states positive for external hemorrhoids GENITOURINARY: No dysuria.  HEMATOLOGY: No anemia SKIN: Skin tear MUSCULOSKELETAL: No joint pain .   NEUROLOGIC: Positive for weakness PSYCHIATRY: History of dementia with behavioral disturbance  MEDICATIONS AT HOME:   Prior to Admission medications   Medication Sig Start Date End Date Taking? Authorizing Provider  amLODipine (NORVASC) 5 MG tablet Take 5 mg daily by mouth.   Yes [provider]  atorvastatin (LIPITOR) 20 MG tablet Take 20 mg at bedtime by mouth.   Yes [provider]  Calcium Carb-Cholecalciferol (CALCIUM 600+D3) 600-800 MG-UNIT TABS Take 1 tablet daily by mouth.   Yes [provider]  cholecalciferol (VITAMIN D)  1000 units tablet Take 1,000 Units daily by mouth.   Yes [provider]  citalopram (CELEXA) 10 MG tablet Take 10 mg every morning by mouth.   Yes [provider]  divalproex (DEPAKOTE ER) 250 MG 24 hr tablet Take 500 mg daily by mouth.   Yes [provider]  furosemide (LASIX) 40 MG tablet Take 40 mg daily by mouth. If needed for leg swelling   Yes [provider]  hydrOXYzine (ATARAX/VISTARIL) 25 MG tablet Take 25 mg 2 (two) times daily by mouth.   Yes [provider]  levothyroxine (SYNTHROID, LEVOTHROID) 50 MCG tablet Take 50 mcg daily before breakfast by mouth.   Yes [provider]  temazepam (RESTORIL) 15 MG capsule Take 15 mg at bedtime as needed by mouth for sleep.   Yes [provider]  acetaminophen (TYLENOL) 325 MG tablet Take 2 tablets (650 mg total) by mouth every 6 (six) hours as needed for mild pain (or Fever >/= 101). 03/16/15   Colleen Can, MD  triamcinolone cream (KENALOG) 0.1 % MIX WITH EUCERIN AT HOME AND APPLY TO RASH twice a day as directed BY PRESCRIBER 06/30/15   Steele Sizer, MD      VITAL SIGNS:  Blood pressure (!) 154/64, pulse 75, temperature 98.2 F (36.8 C), temperature source Axillary, resp. rate 18, height 5\' 4"  (1.626 m), weight 45.4 kg (100 lb), SpO2 92 %.  PHYSICAL EXAMINATION:  GENERAL:  83 y.o.-year-old patient lying in the bed with no acute distress.  EYES: Pupils equal, round, reactive to light and accommodation. No scleral icterus. Extraocular muscles intact.  HEENT: Head atraumatic, normocephalic. Oropharynx and nasopharynx clear.  NECK:  Supple, no jugular venous distention. No thyroid enlargement, no tenderness.  LUNGS: Normal breath sounds bilaterally, no wheezing, rales,rhonchi or crepitation. No use of accessory muscles of respiration.  CARDIOVASCULAR: S1, S2 normal. No murmurs, rubs, or gallops.  ABDOMEN: Soft, nontender, nondistended. Bowel sounds present. No organomegaly or mass.  EXTREMITIES: No pedal edema, cyanosis, or clubbing.  NEUROLOGIC: Cranial nerves II through XII are intact. Muscle strength 5/5 in all extremities. Sensation intact. Gait not checked.  PSYCHIATRIC: The patient is alert.  SKIN: Left forearm large skin tear around 7 cm x 4 cm with some drainage and erythema surrounding.  LABORATORY PANEL:   CBC Recent Labs  Lab 01/18/17 1703  WBC 8.2  HGB 13.5  HCT 40.8  PLT 432    ------------------------------------------------------------------------------------------------------------------  Chemistries  Recent Labs  Lab 01/18/17 1703  NA 142  K 4.2  CL 105  CO2 28  GLUCOSE 98  BUN 22*  CREATININE 1.03*  CALCIUM 8.8*  AST 15  ALT 11*  ALKPHOS 76  BILITOT 0.5   ------------------------------------------------------------------------------------------------------------------    IMPRESSION AND PLAN:    1.  Cellulitis of the left forearm.  Started off as a skin tear.  Now with surrounding cellulitis.  Vancomycin started in ER and I will continue this. 2.  Dementia with behavioral disturbance.  Change Depakote to Depakote sprinkle twice daily since the patient having trouble swallowing this pill. 3.  Hypothyroidism unspecified continue levothyroxine. 4.  Essential hypertension on Norvasc 5.  Hyperlipidemia unspecified on atorvastatin 6.  Dehydration.  Gentle IV fluid hydration and hold Lasix. 7.  External hemorrhoids Anusol cream   All the records are reviewed and case discussed with ED provider. Management plans discussed with the patient, family and they are in agreement.  CODE STATUS: full code  TOTAL TIME TAKING CARE OF THIS  PATIENT: 50 minutes.    Loletha Grayer M.D on 01/18/2017 at 6:40 PM  Between 7am to 6pm - Pager - [phone]  After 6pm call admission pager [phone]  [facility]  [phone]  CC: Primary care physician; Perrin Maltese, MD

## 2017-01-18 NOTE — ED Notes (Signed)
Scott, EDT transporting pt to room 215.

## 2017-01-18 NOTE — ED Notes (Signed)
Bandaged pt arm. Placed pt on bedpan

## 2017-01-18 NOTE — ED Notes (Signed)
Re-dressed arm.

## 2017-01-18 NOTE — ED Provider Notes (Signed)
[facility] Emergency Department Provider Note    First MD Initiated Contact with Patient 01/18/17 1714     (approximate)  I have reviewed the triage vital signs and the nursing notes.   HISTORY  Chief Complaint Cellulitis   HPI Tammy Henry is a 83 y.o. female presents from home for evaluation of redness swelling and pain to the left upper extremity.  Patient had traumatic skin tear that was partially glued.  She was discharged home and over the past several days has developed worsening redness and pain.  Now with this purulent drainage.  Has not been on any antibiotics.  Denies any chest pains or shortness of breath.  No measured fevers at home.  According the caregiver she does not take her medications as directed.  Past Medical History:  Diagnosis Date  . Acute kidney failure, unspecified ([place])   . Anxiety state, unspecified   . Cachexia ([place])   . Dehydration   . Depressive disorder, not elsewhere classified   . Early satiety   . Edema   . Essential hypertension, benign   . Functional diarrhea   . Iron deficiency anemia, unspecified   . Nonspecific abnormal electrocardiogram (ECG) (EKG)   . Osteoporosis, unspecified   . Other and unspecified disc disorder of cervical region   . Other frontotemporal dementia   . Other psoriasis   . Other specified disorders of shoulder joint   . Unspecified hypothyroidism   . Unspecified pruritic disorder   . Unspecified venous (peripheral) insufficiency   . Urinary tract infection, site not specified    Family History  Problem Relation Age of Onset  . Lymphoma Mother   . Alzheimer's disease Mother   . Alzheimer's disease Father   . Breast cancer Sister   . Depression Daughter   . Asthma Daughter   . Schizophrenia Daughter   . Thyroid disease Daughter    Past Surgical History:  Procedure Laterality Date  . CATARACT EXTRACTION, BILATERAL     Patient Active Problem List   Diagnosis Date Noted  .  Cellulitis 01/18/2017  . Intussusception intestine ([place])   . Hemorrhoid   . Intussusception of cecum ([place]) 03/11/2015  . Intussusception ([place]) 03/06/2015  . Protein-calorie malnutrition ([place]) 02/24/2015  . Cachexia ([place]) 12/28/2014  . Chronic eczema 12/28/2014  . Pain in shoulder 12/28/2014  . Chronic kidney disease (CKD), stage III (moderate) ([place]) 12/28/2014  . Major depressive disorder 12/28/2014  . Dementia of frontal lobe type 12/28/2014  . History of blood transfusion 12/28/2014  . Anemia, iron deficiency 12/28/2014  . Insomnia 12/28/2014  . Itch of skin 12/28/2014  . Iron deficiency anemia 12/28/2014  . 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 Adult hypothyroidism 05/10/2009  . Varicose vein 05/10/2009  . Chronic venous insufficiency 02/06/2007  . Hypertension goal BP (blood pressure) < 140/90 07/11/2006  . OP (osteoporosis) 07/11/2006      Prior to Admission medications   Medication Sig Start Date End Date Taking? Authorizing Provider  amLODipine (NORVASC) 5 MG tablet Take 5 mg daily by mouth.   Yes [provider]  atorvastatin (LIPITOR) 20 MG tablet Take 20 mg at bedtime by mouth.   Yes [provider]  Calcium Carb-Cholecalciferol (CALCIUM 600+D3) 600-800 MG-UNIT TABS Take 1 tablet daily by mouth.   Yes [provider]  cholecalciferol (VITAMIN D) 1000 units tablet Take 1,000 Units daily by mouth.   Yes [provider]  citalopram (CELEXA) 10 MG tablet Take 10 mg every morning  by mouth.   Yes [provider]  divalproex (DEPAKOTE ER) 250 MG 24 hr tablet Take 500 mg daily by mouth.   Yes [provider]  furosemide (LASIX) 40 MG tablet Take 40 mg daily by mouth. If needed for leg swelling   Yes [provider]  hydrOXYzine (ATARAX/VISTARIL) 25 MG tablet Take 25 mg 2 (two) times daily by mouth.   Yes [provider]  levothyroxine (SYNTHROID, LEVOTHROID) 50 MCG tablet Take 50 mcg daily before breakfast by  mouth.   Yes [provider]  temazepam (RESTORIL) 15 MG capsule Take 15 mg at bedtime as needed by mouth for sleep.   Yes [provider]  acetaminophen (TYLENOL) 325 MG tablet Take 2 tablets (650 mg total) by mouth every 6 (six) hours as needed for mild pain (or Fever >/= 101). 03/16/15   Colleen Can, MD  triamcinolone cream (KENALOG) 0.1 % MIX WITH EUCERIN AT HOME AND APPLY TO RASH twice a day as directed BY PRESCRIBER 06/30/15   Steele Sizer, MD    Allergies Lamisil [terbinafine] and Sulfur    Social History Social History   Tobacco Use  . Smoking status: Never Smoker  . Smokeless tobacco: Never Used  Substance Use Topics  . Alcohol use: No  . Drug use: No    Review of Systems Patient denies headaches, rhinorrhea, blurry vision, numbness, shortness of breath, chest pain, edema, cough, abdominal pain, nausea, vomiting, diarrhea, dysuria, fevers, rashes or hallucinations unless otherwise stated above in HPI. ____________________________________________   PHYSICAL EXAM:  VITAL SIGNS: Vitals:   01/18/17 1800 01/18/17 1830  BP: (!) 154/64 (!) 161/72  Pulse: 75 77  Resp:    Temp:    SpO2: 92% 92%    Constitutional: Alert and oriented. Frail and elderly appearing, in no acute distress. Eyes: Conjunctivae are normal.  Head: Atraumatic. Nose: No congestion/rhinnorhea. Mouth/Throat: Mucous membranes are moist.   Neck: No stridor. Painless ROM.  Cardiovascular: Normal rate, regular rhythm. Grossly normal heart sounds.  Good peripheral circulation. Respiratory: Normal respiratory effort.  No retractions. Lungs CTAB. Gastrointestinal: Soft and nontender. No distention. No abdominal bruits. No CVA tenderness. Musculoskeletal: No lower extremity tenderness nor edema.  No joint effusions. Neurologic:  Normal speech and language. No gross focal neurologic deficits are appreciated. No facial droop Skin: Large defect of the left upper extremity roughly 7  x 7cm with area of appropriately healing granulation tissue but area where previously glued skin tear has adhered to underlying skin and developed purulent drainage with surrounding erythema involving the majority of the forearm with streaking redness proximally. Psychiatric: appropriate  ____________________________________________   LABS (all labs ordered are listed, but only abnormal results are displayed)  Results for orders placed or performed during the hospital encounter of 01/18/17 (from the past 24 hour(s))  Lactic acid, plasma     Status: None   Collection Time: 01/18/17  5:03 PM  Result Value Ref Range   Lactic Acid, Venous 1.1 0.5 - 1.9 mmol/L  Comprehensive metabolic panel     Status: Abnormal   Collection Time: 01/18/17  5:03 PM  Result Value Ref Range   Sodium 142 135 - 145 mmol/L   Potassium 4.2 3.5 - 5.1 mmol/L   Chloride 105 101 - 111 mmol/L   CO2 28 22 - 32 mmol/L   Glucose, Bld 98 65 - 99 mg/dL   BUN 22 (H) 6 - 20 mg/dL   Creatinine, Ser 1.03 (H) 0.44 - 1.00 mg/dL  Calcium 8.8 (L) 8.9 - 10.3 mg/dL   Total Protein 6.5 6.5 - 8.1 g/dL   Albumin 3.0 (L) 3.5 - 5.0 g/dL   AST 15 15 - 41 U/L   ALT 11 (L) 14 - 54 U/L   Alkaline Phosphatase 76 38 - 126 U/L   Total Bilirubin 0.5 0.3 - 1.2 mg/dL   GFR calc non Af Amer 48 (L) >60 mL/min   GFR calc Af Amer 56 (L) >60 mL/min   Anion gap 9 5 - 15  CBC with Differential     Status: Abnormal   Collection Time: 01/18/17  5:03 PM  Result Value Ref Range   WBC 8.2 3.6 - 11.0 K/uL   RBC 4.65 3.80 - 5.20 MIL/uL   Hemoglobin 13.5 12.0 - 16.0 g/dL   HCT 40.8 35.0 - 47.0 %   MCV 87.7 80.0 - 100.0 fL   MCH 29.1 26.0 - 34.0 pg   MCHC 33.2 32.0 - 36.0 g/dL   RDW 20.0 (H) 11.5 - 14.5 %   Platelets 432 150 - 440 K/uL   Neutrophils Relative % 72 %   Lymphocytes Relative 16 %   Monocytes Relative 3 %   Eosinophils Relative 7 %   Basophils Relative 0 %   Band Neutrophils 2 %   Metamyelocytes Relative 0 %   Myelocytes 0 %    Promyelocytes Absolute 0 %   Blasts 0 %   nRBC 0 0 /100 WBC   Other 0 %   Neutro Abs 6.1 1.4 - 6.5 K/uL   Lymphs Abs 1.3 1.0 - 3.6 K/uL   Monocytes Absolute 0.2 0.2 - 0.9 K/uL   Eosinophils Absolute 0.6 0 - 0.7 K/uL   Basophils Absolute 0.0 0 - 0.1 K/uL   RBC Morphology POLYCHROMASIA PRESENT    ____________________________________________ ____________________________________________  RADIOLOGY   ____________________________________________   PROCEDURES  Procedure(s) performed:  Wound repair Date/Time: 01/18/2017 6:52 PM Performed by: Merlyn Lot, MD Authorized by: Merlyn Lot, MD  Consent: Verbal consent obtained. Consent given by: patient Patient tolerance: Patient tolerated the procedure well with no immediate complications Comments: Area of purulent and adhered skin flap and tissue was removed using scissors after it was washed with saline.  No undermining.  No foreign bodies.       Critical Care performed: no ____________________________________________   INITIAL IMPRESSION / ASSESSMENT AND PLAN / ED COURSE  Pertinent labs & imaging results that were available during my care of the patient were reviewed by me and considered in my medical decision making (see chart for details).  DDX: cellulitis, nsti, abscess, wound ulceration  Tammy Henry is a 83 y.o. who presents to the ED with purulent cellulitis as described above.  Patient is not septic at this time but given the extent of her wound, her comorbidities, frailty and noncompliance with taking her medications due to her underlying dementia with poorly healing wound I do feel the patient will require admission for IV antibiotics and wound consult.  Have discussed with the patient and available family all diagnostics and treatments performed thus far and all questions were answered to the best of my ability. The patient demonstrates understanding and agreement with plan.        ____________________________________________   FINAL CLINICAL IMPRESSION(S) / ED DIAGNOSES  Final diagnoses:  Cellulitis of left upper extremity      NEW MEDICATIONS STARTED DURING THIS VISIT:  This SmartLink is deprecated. Use AVSMEDLIST instead to display the medication list for a patient.  Note:  This document was prepared using Dragon voice recognition software and [employer] include unintentional dictation errors.    Merlyn Lot, MD 01/18/17 [phone]

## 2017-01-18 NOTE — ED Notes (Addendum)
Pt has laceration on left forearm which started out as a simple skin tear. Forearm is red, weaping, and open. Family at bedside. Dr. Quentin Cornwall at the bedside.

## 2017-01-19 DIAGNOSIS — Z7189 Other specified counseling: Secondary | ICD-10-CM

## 2017-01-19 DIAGNOSIS — F0281 Dementia in other diseases classified elsewhere with behavioral disturbance: Secondary | ICD-10-CM

## 2017-01-19 DIAGNOSIS — L03114 Cellulitis of left upper limb: Principal | ICD-10-CM

## 2017-01-19 DIAGNOSIS — G3183 Dementia with Lewy bodies: Secondary | ICD-10-CM

## 2017-01-19 LAB — BASIC METABOLIC PANEL
Anion gap: 7 (ref 5–15)
BUN: 19 mg/dL (ref 6–20)
CHLORIDE: 106 mmol/L (ref 101–111)
CO2: 27 mmol/L (ref 22–32)
Calcium: 8 mg/dL — ABNORMAL LOW (ref 8.9–10.3)
Creatinine, Ser: 0.8 mg/dL (ref 0.44–1.00)
GFR calc non Af Amer: >60 mL/min (ref >60)
Glucose, Bld: 70 mg/dL (ref 65–99)
POTASSIUM: 3.6 mmol/L (ref 3.5–5.1)
SODIUM: 140 mmol/L (ref 135–145)

## 2017-01-19 LAB — CBC
HEMATOCRIT: 37.4 % (ref 35.0–47.0)
Hemoglobin: 12.4 g/dL (ref 12.0–16.0)
MCH: 28.9 pg (ref 26.0–34.0)
MCHC: 33.2 g/dL (ref 32.0–36.0)
MCV: 87.1 fL (ref 80.0–100.0)
Platelets: 371 10*3/uL (ref 150–440)
RBC: 4.3 MIL/uL (ref 3.80–5.20)
RDW: 19.8 % — ABNORMAL HIGH (ref 11.5–14.5)
WBC: 6.4 10*3/uL (ref 3.6–11.0)

## 2017-01-19 MED ORDER — ENSURE ENLIVE PO LIQD
237.0000 mL | Freq: Two times a day (BID) | ORAL | Status: DC
Start: 2017-01-19 — End: 2017-01-23
  Administered 2017-01-19 – 2017-01-22 (×4): 237 mL via ORAL

## 2017-01-19 MED ORDER — MUPIROCIN 2 % EX OINT
TOPICAL_OINTMENT | Freq: Every day | CUTANEOUS | Status: DC
  Administered 2017-01-20 – 2017-01-23 (×3): via TOPICAL
  Filled 2017-01-19 (×2): qty 22

## 2017-01-19 MED ORDER — INFLUENZA VAC SPLIT HIGH-DOSE 0.5 ML IM SUSY
0.5000 mL | PREFILLED_SYRINGE | INTRAMUSCULAR | Status: AC
  Administered 2017-01-20: 0.5 mL via INTRAMUSCULAR
  Filled 2017-01-19: qty 0.5

## 2017-01-19 NOTE — Progress Notes (Signed)
New referral for out patient palliative to follow at short term rehab received from [place]. Facility and discharge date undetermined. Patient information faxed to referral. Flo Shanks RN, BSN, [facility], hospital liaison [phone] c

## 2017-01-19 NOTE — Clinical Social Work Note (Signed)
Clinical Social Work Assessment  Patient Details  Name: Tammy Henry MRN: [id number] Date of Birth: February 28, 1930  Date of referral:  01/19/17               Reason for consult:  Discharge Planning                Permission sought to share information with:  Chartered certified accountant granted to share information::  Yes, Release of Information Signed  Name::        Agency::     Relationship::     Contact Information:     Housing/Transportation Living arrangements for the past 2 months:  Single Family Home Source of Information:  Adult Children Patient Interpreter Needed:  None Criminal Activity/Legal Involvement Pertinent to Current Situation/Hospitalization:  No - Comment as needed Significant Relationships:  Adult Children Lives with:  Adult Children Do you feel safe going back to the place where you live?  Yes Need for family participation in patient care:  Yes (Comment)  Care giving concerns:  Patient will require a higher level of care and cannot return home.   Social Worker assessment / plan:  Patient is cared for at home by one of her daughters: Mel Almond. Mel Almond is disabled and thus patient's contact and responsible party is her other daughter: Everardo Beals: [phone]. CSW contacted by Palliative Care, Crystal, this morning to ask if I could call Mrs. Autumn Patty and answer some questions for her. Mrs. Autumn Patty has been through a great deal recently. Her husband died yesterday and funeral is for Sunday. Her sister had to be hospitalized recently. CSW contacted Mrs. Autumn Patty and answered her questions. It has been decided to have patient go to STR and then potentially convert to hospice. This will give Mrs. Autumn Patty some time to get through the next three days and also to decide on long term arrangements for her mother. CSW to ask for PT order.    Employment status:  Retired Forensic scientist:  Medicare PT Recommendations:  [place] /  Referral to community resources:  [place]  Patient/Family's Response to care:  Mrs. Autumn Patty expressed much appreciation for CSW assistance.   Patient/Family's Understanding of and Emotional Response to Diagnosis, Current Treatment, and Prognosis:  Mrs. Autumn Patty realizes that patient is nearing end of life and is considering hospice down the road but wishes to attempt STR first.  Emotional Assessment Appearance:  Appears stated age Attitude/Demeanor/Rapport:    Affect (typically observed):    Orientation:  Oriented to Self Alcohol / Substance use:  Not Applicable Psych involvement (Current and /or in the community):  No (Comment)  Discharge Needs  Concerns to be addressed:  Care Coordination Readmission within the last 30 days:  No Current discharge risk:  None Barriers to Discharge:  No Barriers Identified   Shela Leff, LCSW 01/19/2017, 12:58 PM

## 2017-01-19 NOTE — Progress Notes (Signed)
Initial Nutrition Assessment  DOCUMENTATION CODES:   Underweight  INTERVENTION:   Ensure Enlive po BID, each supplement provides 350 kcal and 20 grams of protein  Magic cup TID with meals, each supplement provides 290 kcal and 9 grams of protein  NUTRITION DIAGNOSIS:   Underweight related to chronic illness(dementia, advanced age ) as evidenced by other (comment)(pt with low BMI).  GOAL:   Patient will meet greater than or equal to 90% of their needs  MONITOR:   PO intake, Supplement acceptance, Labs, Weight trends, I & O's  REASON FOR ASSESSMENT:   Malnutrition Screening Tool    ASSESSMENT:   83 y.o. female presents from home for evaluation of redness swelling and pain to the left upper extremity.   RD visited pt's room today. Pt and family members were meeting with palliative care at time of RD visit. Per chart, family leaning towards hospice services. RD will order Ensure and Magic Cups to be available for pt comfort. Per chart, pt is weight stable on dysphagia 1 diet. Pt is likely malnourished given her advanced age and dementia.   Medications reviewed and include: Ca-Vit D, Vitamin D, celexa, lovenox, synthroid, vancomycin, NaCl @40ml /hr   Labs reviewed: Ca 8.0(L)  Unable to complete Nutrition-Focused physical exam at this time.   Diet Order:  DIET - DYS 1 Room service appropriate? Yes; Fluid consistency: Thin  EDUCATION NEEDS:   Not appropriate for education at this time  Skin:  Reviewed RN Assessment  Last BM:  11/16-type 6  Height:   Ht Readings from Last 1 Encounters:  01/18/17 5\' 4"  (1.626 m)    Weight:   Wt Readings from Last 1 Encounters:  01/18/17 99 lb 8 oz (45.1 kg)    Ideal Body Weight:  54.5 kg  BMI:  Body mass index is 17.08 kg/m.  Estimated Nutritional Needs:   Kcal:  1300-1600kcal/day   Protein:  68-76g/day   Fluid:  >1.6L/day   Koleen Distance MS, RD, LDN Pager #[phone] After Hours Pager: [phone]

## 2017-01-19 NOTE — Progress Notes (Signed)
Pharmacy Antibiotic Note  Tammy Henry is a 83 y.o. female admitted on 01/18/2017 with cellulitis.  Pharmacy has been consulted for vancomycin dosing.  Plan:  Will continue vancomycin 500 IV every 24 hours.  Goal trough 10-15 mcg/mL. Will check levels/adjust dosing as clinically indicated.   Height: 5\' 4"  (162.6 cm) Weight: 99 lb 8 oz (45.1 kg) IBW/kg (Calculated) : 54.7  Temp (24hrs), Avg:97.7 F (36.5 C), Min:97.4 F (36.3 C), Max:98.2 F (36.8 C)  Recent Labs  Lab 01/18/17 1703 01/18/17 2039 01/19/17 0435  WBC 8.2  --  6.4  CREATININE 1.03*  --  0.80  LATICACIDVEN 1.1 1.5  --     Estimated Creatinine Clearance: 36.6 mL/min (by C-G formula based on SCr of 0.8 mg/dL).    Allergies  Allergen Reactions  . Lamisil [Terbinafine] Nausea And Vomiting  . Sulfur Nausea And Vomiting    Antimicrobials this admission: Vancomycin 11/15 >>   Dose adjustments this admission:   Microbiology results:  Thank you for allowing pharmacy to be a part of this patient's care.  Laquita Harlan D 01/19/2017 10:48 AM

## 2017-01-19 NOTE — Consult Note (Signed)
Consultation Note Date: 01/19/2017   Patient Name: Tammy Henry  DOB: 01/22/1930  MRN: [id number]  Age / Sex: 83 y.o., female  PCP: Perrin Maltese, MD Referring Physician: Max Sane, MD  Reason for Consultation: Establishing goals of care  HPI/Patient Profile:  Tammy Henry  is a 83 y.o. female who recently had a skin tear on Sunday.  Family states that a little glue was used to keep it together.  Area started getting more inflamed and red.  Patient had a low-grade temperature.  Family noticed she has been drinking less recently.  They also noticed a foul smell in her urine.  Patient is a poor historian secondary to dementia.  Hospitalist services contacted for cellulitis of the left arm.    Clinical Assessment and Goals of Care: Tammy Henry is resting in bed. Her daughter and home caregiver is at bedside. She has fronto-temporal dementia, and has had this since around 2011, in addition to venous insufficiency, CKD, osteoporosis, cervical disk disorder, protein calorie malnutrition, anemia, hypothyroidism, HTN, HLD, history of pulmonary edema possibly due to right heart failure, history of CVA, history of falls, history of multiple intasusceptions.   Up until 2 months ago she was walking and would wonder in the house and outside, she is no longer able to walk and caregivers have to hold her up to get her on bedside toilet. She is usually incontinent of bladder and bowel. She becomes agitated because she does not want to get up out of bed and yells at care givers. Per caregiver, she also yells the name Tammy Henry all day and is usually inconsolable. She sometimes forms sentences but they often are nonsensical or do not fit the conversation. She is only able to feed herself pureed food with one on one supervision and frequent prompting. She has been using pureed foods for over the past year, and caregivers have had to  crush pills for swallowing.  She frequently plays with her food and sometimes puts in in her napkin indicating a markedly decreased appetite and desire for food.  BMI is 18.5, she is frail appearing with poor skin turgor and muscle wasting.  She has cold intolerance and does not like to leave the house to go to appointments. She has failure to thrive.     Daughter Tammy Henry is her daughter and POA. Tammy Henry is unable to come to hospital today as her husband died yesterday and she is planning funeral arrangements for funeral Sunday. She will be better able to assess and discuss the situation Monday, after this weekend. Tammy Henry states her sister (patient's other daughter) is unable and not allowed to make medical decisions and is not allowed to act as a Air traffic controller. She states she needs to see her mother and can further discuss planning.    We discussed diagnosis, prognosis, GOC, EOL wishes disposition and options.   The difference between an aggressive medical intervention path and a Hospice comfort care path for this patient was discussed.  Values and goals of care  important to patient and family were discussed.  She is leaning toward Hospice but has questions for Hospice and Social Work. Daughter expressed concerns for finances. Hospice and SW were both updated and will reach out to [facility]. Per conversation with Tammy Henry, will continue current treatment. She does request her mother be made DNR. Code status updated.         SUMMARY OF RECOMMENDATIONS    POA [name] requesting to speak with SW and Hospice. She wants to see her mother following the funeral to better assess the situation and make decisions.  Code Status/Advance Care Planning:  DNR per Tammy Henry.   Palliative Prophylaxis:   Aspiration and Palliative Wound Care    Prognosis:   < 6 months  Discharge Planning: To Be Determined      Primary Diagnoses: Present on Admission: . Cellulitis   I have reviewed the medical record,  interviewed the patient and family, and examined the patient. The following aspects are pertinent.  Past Medical History:  Diagnosis Date  . Acute kidney failure, unspecified ([place])   . Anxiety state, unspecified   . Cachexia ([place])   . Dehydration   . Depressive disorder, not elsewhere classified   . Early satiety   . Edema   . Essential hypertension, benign   . Functional diarrhea   . Iron deficiency anemia, unspecified   . Nonspecific abnormal electrocardiogram (ECG) (EKG)   . Osteoporosis, unspecified   . Other and unspecified disc disorder of cervical region   . Other frontotemporal dementia   . Other psoriasis   . Other specified disorders of shoulder joint   . Unspecified hypothyroidism   . Unspecified pruritic disorder   . Unspecified venous (peripheral) insufficiency   . Urinary tract infection, site not specified    Social History   Socioeconomic History  . Marital status: Widowed    Spouse name: None  . Number of children: None  . Years of education: None  . Highest education level: None  Social Needs  . Financial resource strain: None  . Food insecurity - worry: None  . Food insecurity - inability: None  . Transportation needs - medical: None  . Transportation needs - non-medical: None  Occupational History  . None  Tobacco Use  . Smoking status: Never Smoker  . Smokeless tobacco: Never Used  Substance and Sexual Activity  . Alcohol use: No  . Drug use: No  . Sexual activity: None  Other Topics Concern  . None  Social History Narrative  . None   Family History  Problem Relation Age of Onset  . Lymphoma Mother   . Alzheimer's disease Mother   . Alzheimer's disease Father   . Breast cancer Sister   . Depression Daughter   . Asthma Daughter   . Schizophrenia Daughter   . Thyroid disease Daughter    Scheduled Meds: . amLODipine  5 mg Oral Daily  . atorvastatin  20 mg Oral QHS  . calcium-vitamin D  1 tablet Oral Daily  . cholecalciferol  1,000  Units Oral Daily  . citalopram  10 mg Oral [id number]  . divalproex  250 mg Oral BID  . enoxaparin (LOVENOX) injection  30 mg Subcutaneous Q24H  . hydrocortisone   Rectal QID  . hydrOXYzine  25 mg Oral BID  . [START ON 01/20/2017] Influenza vac split quadrivalent PF  0.5 mL Intramuscular Tomorrow-1000  . levothyroxine  50 mcg Oral QAC breakfast  . mupirocin ointment   Topical Daily   Continuous Infusions: .  sodium chloride 40 mL/hr at 01/18/17 2106  . vancomycin     PRN Meds:.acetaminophen **OR** acetaminophen, temazepam Medications Prior to Admission:  Prior to Admission medications   Medication Sig Start Date End Date Taking? Authorizing Provider  amLODipine (NORVASC) 5 MG tablet Take 5 mg daily by mouth.   Yes [provider]  atorvastatin (LIPITOR) 20 MG tablet Take 20 mg at bedtime by mouth.   Yes [provider]  Calcium Carb-Cholecalciferol (CALCIUM 600+D3) 600-800 MG-UNIT TABS Take 1 tablet daily by mouth.   Yes [provider]  cholecalciferol (VITAMIN D) 1000 units tablet Take 1,000 Units daily by mouth.   Yes [provider]  citalopram (CELEXA) 10 MG tablet Take 10 mg every morning by mouth.   Yes [provider]  divalproex (DEPAKOTE ER) 250 MG 24 hr tablet Take 500 mg daily by mouth.   Yes [provider]  furosemide (LASIX) 40 MG tablet Take 40 mg daily by mouth. If needed for leg swelling   Yes [provider]  hydrOXYzine (ATARAX/VISTARIL) 25 MG tablet Take 25 mg 2 (two) times daily by mouth.   Yes [provider]  levothyroxine (SYNTHROID, LEVOTHROID) 50 MCG tablet Take 50 mcg daily before breakfast by mouth.   Yes [provider]  temazepam (RESTORIL) 15 MG capsule Take 15 mg at bedtime as needed by mouth for sleep.   Yes [provider]  acetaminophen (TYLENOL) 325 MG tablet Take 2 tablets (650 mg total) by mouth every 6 (six) hours as needed for mild pain (or Fever >/= 101). 03/16/15    Colleen Can, MD  triamcinolone cream (KENALOG) 0.1 % MIX WITH EUCERIN AT HOME AND APPLY TO RASH twice a day as directed BY PRESCRIBER 06/30/15   Steele Sizer, MD   Allergies  Allergen Reactions  . Lamisil [Terbinafine] Nausea And Vomiting  . Sulfur Nausea And Vomiting   Review of Systems  Unable to perform ROS   Physical Exam  HENT:  Head: Atraumatic.  Eyes: EOM are normal.  Pulmonary/Chest: Effort normal.  Neurological: She is alert.  Psychiatric:  Confused. Baseline dementia    Vital Signs: BP 140/78 (BP Location: Left Arm)   Pulse 79   Temp (!) 97.5 F (36.4 C) (Oral)   Resp 16   Ht 5\' 4"  (1.626 m)   Wt 45.1 kg (99 lb 8 oz)   SpO2 95%   BMI 17.08 kg/m  Pain Assessment: PAINAD       SpO2: SpO2: 95 % O2 Device:SpO2: 95 % O2 Flow Rate: .   IO: Intake/output summary:   Intake/Output Summary (Last 24 hours) at 01/19/2017 1124 Last data filed at 01/19/2017 0658 Gross per 24 hour  Intake 709 ml  Output 200 ml  Net 509 ml    LBM:   Baseline Weight: Weight: 45.4 kg (100 lb) Most recent weight: Weight: 45.1 kg (99 lb 8 oz)     Palliative Assessment/Data: 40% at best     Time In: 11:00 Time Out: 12:10 Time Total: 1 hour Greater than 50%  of this time was spent counseling and coordinating care related to the above assessment and plan.  Signed by: Asencion Gowda, NP 01/19/2017 1:40 PM Office: [phone] 7am-7pm  Please see Amion for pager number and availability  Call primary team after hours   Please contact Palliative Medicine Team phone at [phone] for questions and concerns.  For individual provider: See Shea Evans

## 2017-01-19 NOTE — Consult Note (Signed)
[facility] Nurse wound consult note Reason for Consult: Trauma wound to left forearm.  Resists care and is combative at times, per daughter and paid caregiver.  Wound type:trauma Pressure Injury POA: NA Measurement: 8.6 cm x 5.4 cm x 0.1 cm peeling epithelium Wound bed: Ruddy red with devitalized tissue present.  Unable to approximate wound edges at all.   Drainage (amount, consistency, odor) Moderate serosanguinous weeping.  No odor Periwound: Ecchymosis and fragile, paper thin skin Dressing procedure/placement/frequency:Apply mupirocin ointment to wound bed. Contact layer for atraumatic dressing removal and calcium alginate for absorption.  Noted allergy to sulfur, no silver will be implemented. Change daily Will not follow at this time.  Please re-consult if needed.  Domenic Moras RN BSN [facility] [place] Pager [phone]

## 2017-01-19 NOTE — Progress Notes (Signed)
[facility] NAME: Tammy Henry    MR#:  [id number]  DATE OF BIRTH:  [birth year]  SUBJECTIVE:  CHIEF COMPLAINT:   Chief Complaint  Patient presents with  . Cellulitis  seems comfortable, daughter at bedside REVIEW OF SYSTEMS:  Review of Systems  Constitutional: Negative for chills, fever and weight loss.  HENT: Negative for nosebleeds and sore throat.   Eyes: Negative for blurred vision.  Respiratory: Negative for cough, shortness of breath and wheezing.   Cardiovascular: Negative for chest pain, orthopnea, leg swelling and PND.  Gastrointestinal: Negative for abdominal pain, constipation, diarrhea, heartburn, nausea and vomiting.  Genitourinary: Negative for dysuria and urgency.  Musculoskeletal: Negative for back pain.  Skin: Positive for rash.  Neurological: Negative for dizziness, speech change, focal weakness and headaches.  Endo/Heme/Allergies: Does not bruise/bleed easily.  Psychiatric/Behavioral: Negative for depression.    DRUG ALLERGIES:   Allergies  Allergen Reactions  . Lamisil [Terbinafine] Nausea And Vomiting  . Sulfur Nausea And Vomiting   VITALS:  Blood pressure (!) 118/56, pulse 69, temperature (!) 97.5 F (36.4 C), temperature source Oral, resp. rate 16, height 5\' 4"  (1.626 m), weight 45.1 kg (99 lb 8 oz), SpO2 98 %. PHYSICAL EXAMINATION:  Physical Exam  Constitutional: She is oriented to person, place, and time. She appears malnourished. She appears unhealthy. She appears cachectic.  HENT:  Head: Normocephalic and atraumatic.  Eyes: Conjunctivae and EOM are normal. Pupils are equal, round, and reactive to light.  Neck: Normal range of motion. Neck supple. No tracheal deviation present. No thyromegaly present.  Cardiovascular: Normal rate, regular rhythm and normal heart sounds.  Pulmonary/Chest: Effort normal and breath sounds normal. No respiratory distress. She has no wheezes. She exhibits no  tenderness.  Abdominal: Soft. Bowel sounds are normal. She exhibits no distension. There is no tenderness.  Musculoskeletal: Normal range of motion.  Neurological: She is alert and oriented to person, place, and time. No cranial nerve deficit.  Skin: Skin is warm and dry. Rash noted.  Dressing and cast in place on lt forearm  Psychiatric: Mood and affect normal.   LABORATORY PANEL:  Female CBC Recent Labs  Lab 01/19/17 0435  WBC 6.4  HGB 12.4  HCT 37.4  PLT 371   ------------------------------------------------------------------------------------------------------------------ Chemistries  Recent Labs  Lab 01/18/17 1703 01/19/17 0435  NA 142 140  K 4.2 3.6  CL 105 106  CO2 28 27  GLUCOSE 98 70  BUN 22* 19  CREATININE 1.03* 0.80  CALCIUM 8.8* 8.0*  AST 15  --   ALT 11*  --   ALKPHOS 76  --   BILITOT 0.5  --    RADIOLOGY:  No results found. ASSESSMENT AND PLAN:   1.  Cellulitis of the left forearm.  Started off as a skin tear.  Now with surrounding cellulitis. Continue Vancomycin, wound care nurse for dressing change 2.  Dementia with behavioral disturbance: continue Depakote sprinkle twice daily since the patient having trouble swallowing this pill. 3.  Hypothyroidism unspecified continue levothyroxine. 4.  Essential hypertension on Norvasc 5.  Hyperlipidemia unspecified on atorvastatin 6.  Dehydration: continue IV fluid hydration and hold Lasix. 7.  External hemorrhoids Anusol cream    All the records are reviewed and case discussed with Care Management/Social Worker. Management plans discussed with the patient, nursing and they are in agreement.  CODE STATUS: DNR  TOTAL TIME TAKING CARE OF THIS PATIENT: 35 minutes.   More than 50% of  the time was spent in counseling/coordination of care: YES  POSSIBLE D/C IN 1-2 DAYS, DEPENDING ON CLINICAL CONDITION.   Max Sane M.D on 01/19/2017 at 7:02 PM  Between 7am to 6pm - Pager - [phone]  After 6pm go to  [email or web address] - Proofreader  [facility]  Office  [phone]  CC: Primary care physician; Perrin Maltese, MD  Note: This dictation was prepared with Dragon dictation along with smaller phrase technology. Any transcriptional errors that result from this process are unintentional.

## 2017-01-20 LAB — BASIC METABOLIC PANEL
Anion gap: 6 (ref 5–15)
BUN: 17 mg/dL (ref 6–20)
CALCIUM: 8.3 mg/dL — AB (ref 8.9–10.3)
CO2: 26 mmol/L (ref 22–32)
CREATININE: 0.8 mg/dL (ref 0.44–1.00)
Chloride: 108 mmol/L (ref 101–111)
GFR calc Af Amer: >60 mL/min (ref >60)
GLUCOSE: 76 mg/dL (ref 65–99)
Potassium: 3.6 mmol/L (ref 3.5–5.1)
Sodium: 140 mmol/L (ref 135–145)

## 2017-01-20 LAB — CBC
HCT: 36.6 % (ref 35.0–47.0)
Hemoglobin: 12.1 g/dL (ref 12.0–16.0)
MCH: 28.8 pg (ref 26.0–34.0)
MCHC: 33.2 g/dL (ref 32.0–36.0)
MCV: 86.8 fL (ref 80.0–100.0)
PLATELETS: 413 10*3/uL (ref 150–440)
RBC: 4.21 MIL/uL (ref 3.80–5.20)
RDW: 19.7 % — AB (ref 11.5–14.5)
WBC: 6.4 10*3/uL (ref 3.6–11.0)

## 2017-01-20 MED ORDER — EYE WASH OPHTH SOLN
2.0000 [drp] | OPHTHALMIC | Status: DC | PRN
  Administered 2017-01-21: 2 [drp] via OPHTHALMIC
  Filled 2017-01-20 (×2): qty 118

## 2017-01-20 NOTE — NC FL2 (Signed)
[place] LEVEL OF CARE SCREENING TOOL     IDENTIFICATION  Patient Name: Tammy Henry Birthdate: 12/26/1929 Sex: female Admission Date (Current Location): 01/18/2017  [place] and [place] Number:  Engineering geologist and Address:  [facility], [address], [place], [place] [address]      Provider Number: [id number]  Attending Physician Name and Address:  Nicholes Mango, MD  Relative Name and Phone Number:       Current Level of Care: Hospital Recommended Level of Care: [place] Prior Approval Number:    Date Approved/Denied:   PASRR Number: ([id number]  )  Discharge Plan: SNF    Current Diagnoses: Patient Active Problem List   Diagnosis Date Noted  . Cellulitis 01/18/2017  . Intussusception intestine ([place])   . Hemorrhoid   . Intussusception of cecum ([place]) 03/11/2015  . Intussusception ([place]) 03/06/2015  . Protein-calorie malnutrition ([place]) 02/24/2015  . Cachexia ([place]) 12/28/2014  . Chronic eczema 12/28/2014  . Pain in shoulder 12/28/2014  . Chronic kidney disease (CKD), stage III (moderate) ([place]) 12/28/2014  . Major depressive disorder 12/28/2014  . Dementia of frontal lobe type 12/28/2014  . History of blood transfusion 12/28/2014  . Anemia, iron deficiency 12/28/2014  . Insomnia 12/28/2014  . Itch of skin 12/28/2014  . Iron deficiency anemia 12/28/2014  . 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 Adult hypothyroidism 05/10/2009  . Varicose vein 05/10/2009  . Chronic venous insufficiency 02/06/2007  . Hypertension goal BP (blood pressure) < 140/90 07/11/2006  . OP (osteoporosis) 07/11/2006    Orientation RESPIRATION BLADDER Height & Weight     Self  Normal Continent Weight: 99 lb 8 oz (45.1 kg) Height:  5\' 4"  (162.6 cm)  BEHAVIORAL SYMPTOMS/MOOD NEUROLOGICAL BOWEL NUTRITION STATUS  (none) (none) Incontinent Diet(Diet: DYS 1 )  AMBULATORY STATUS COMMUNICATION OF NEEDS Skin   Extensive Assist Verbally  Other (Comment)(01/18/17: non-pressure would left arm.)                       Personal Care Assistance Level of Assistance  Bathing, Feeding, Dressing Bathing Assistance: Limited assistance Feeding assistance: Independent Dressing Assistance: Limited assistance Total Care Assistance: Maximum assistance   Functional Limitations Info  Sight, Hearing, Speech Sight Info: Adequate Hearing Info: Adequate Speech Info: Adequate    SPECIAL CARE FACTORS FREQUENCY  PT (By licensed PT), OT (By licensed OT)     PT Frequency: (5) OT Frequency: (5)            Contractures Contractures Info: Not present    Additional Factors Info  Code Status, Allergies Code Status Info: (DNR ) Allergies Info: (Lamisil Terbinafine, Sulfur)           Current Medications (01/20/2017):  This is the current hospital active medication list Current Facility-Administered Medications  Medication Dose Route Frequency Provider Last Rate Last Dose  . 0.9 %  sodium chloride infusion   Intravenous Continuous Loletha Grayer, MD 40 mL/hr at 01/19/17 2239    . acetaminophen (TYLENOL) tablet 650 mg  650 mg Oral Q6H PRN Loletha Grayer, MD   650 mg at 01/20/17 0640   Or  . acetaminophen (TYLENOL) suppository 650 mg  650 mg Rectal Q6H PRN Loletha Grayer, MD      . amLODipine (NORVASC) tablet 5 mg  5 mg Oral Daily Loletha Grayer, MD   5 mg at 01/19/17 1016  . atorvastatin (LIPITOR) tablet 20 mg  20 mg Oral QHS Loletha Grayer, MD  20 mg at 01/19/17 2240  . calcium-vitamin D 500-200 MG-UNIT per tablet 1 tablet  1 tablet Oral Daily Loletha Grayer, MD   1 tablet at 01/19/17 1016  . cholecalciferol (VITAMIN D) tablet 1,000 Units  1,000 Units Oral Daily Loletha Grayer, MD   1,000 Units at 01/19/17 1016  . citalopram (CELEXA) tablet 10 mg  10 mg Oral Haskel Khan, Richard, MD   10 mg at 01/20/17 0636  . divalproex (DEPAKOTE SPRINKLE) capsule 250 mg  250 mg Oral BID Loletha Grayer, MD   250 mg at  01/19/17 2242  . enoxaparin (LOVENOX) injection 30 mg  30 mg Subcutaneous Q24H Loletha Grayer, MD   30 mg at 01/19/17 2242  . eye wash ((SODIUM/POTASSIUM/SOD CHLORIDE)) ophthalmic solution 2 drop  2 drop Both Eyes PRN Laylana Gerwig, MD      . feeding supplement (ENSURE ENLIVE) (ENSURE ENLIVE) liquid 237 mL  237 mL Oral BID BM Manuella Ghazi, Vipul, MD   237 mL at 01/19/17 1451  . hydrocortisone (ANUSOL-HC) 2.5 % rectal cream   Rectal QID Loletha Grayer, MD      . hydrOXYzine (ATARAX/VISTARIL) tablet 25 mg  25 mg Oral BID Loletha Grayer, MD   25 mg at 01/20/17 0752  . Influenza vac split quadrivalent PF (FLUZONE HIGH-DOSE) injection 0.5 mL  0.5 mL Intramuscular Tomorrow-1000 Wieting, Richard, MD      . levothyroxine (SYNTHROID, LEVOTHROID) tablet 50 mcg  50 mcg Oral QAC breakfast Loletha Grayer, MD   50 mcg at 01/20/17 [phone]  . mupirocin ointment (BACTROBAN) 2 %   Topical Daily Manuella Ghazi, Vipul, MD      . temazepam (RESTORIL) capsule 15 mg  15 mg Oral QHS PRN Loletha Grayer, MD   15 mg at 01/19/17 2253  . vancomycin (VANCOCIN) 500 mg in sodium chloride 0.9 % 100 mL IVPB  500 mg Intravenous Q24H Napoleon Form, [facility]   Stopped at 01/19/17 1904     Discharge Medications: Please see discharge summary for a list of discharge medications.  Relevant Imaging Results:  Relevant Lab Results:   Additional Information (SSN: [id number])  Sample, Veronia Beets, LCSW

## 2017-01-20 NOTE — Progress Notes (Signed)
[place] at [place] NAME: Crucita Neider    MR#:  [id number]  DATE OF BIRTH:  [birth year]  SUBJECTIVE:  CHIEF COMPLAINT:   Chief Complaint  Patient presents with  . Cellulitis  seems comfortable, demented, daughter and care giver at bedside REVIEW OF SYSTEMS:  Review of Systems  Unable to perform ROS: Dementia  Constitutional: Negative for chills, fever and weight loss.  HENT: Negative for nosebleeds and sore throat.   Eyes: Negative for blurred vision.  Respiratory: Negative for cough, shortness of breath and wheezing.   Cardiovascular: Negative for chest pain, orthopnea, leg swelling and PND.  Gastrointestinal: Negative for abdominal pain, constipation, diarrhea, heartburn, nausea and vomiting.  Genitourinary: Negative for dysuria and urgency.  Musculoskeletal: Negative for back pain.  Skin: Positive for rash.  Neurological: Negative for dizziness, speech change, focal weakness and headaches.  Endo/Heme/Allergies: Does not bruise/bleed easily.  Psychiatric/Behavioral: Negative for depression.    DRUG ALLERGIES:   Allergies  Allergen Reactions  . Lamisil [Terbinafine] Nausea And Vomiting  . Sulfur Nausea And Vomiting   VITALS:  Blood pressure (!) 151/59, pulse 70, temperature 97.8 F (36.6 C), temperature source Axillary, resp. rate 16, height 5\' 4"  (1.626 m), weight 45.1 kg (99 lb 8 oz), SpO2 95 %. PHYSICAL EXAMINATION:   GENERAL:  lying in the bed with no acute distress. emasciated EYES: Pupils equal, round, reactive to light and accommodation. No scleral icterus.  HEENT: Head atraumatic, normocephalic. Oropharynx and nasopharynx clear.  NECK:  Supple, no jugular venous distention. No thyroid enlargement, no tenderness.  LUNGS: Nml breath sounds bilaterally, no wheezing, rales,rhonchi ;  No use of accessory muscles of respiration.  CARDIOVASCULAR: S1, S2 normal. No murmurs, rubs, or gallops.  ABDOMEN: Soft, nontender,  nondistended. Bowel sounds present. No organomegaly or mass.  EXTREMITIES: No pedal edema, cyanosis, or clubbing.  NEUROLOGIC:demented, alert PSYCHIATRIC: The patient is alert and oriented x 1.  SKIN: LUE : skin with erythematous wound with some discharge No obvious rash, lesion     LABORATORY PANEL:  Female CBC Recent Labs  Lab 01/20/17 0356  WBC 6.4  HGB 12.1  HCT 36.6  PLT 413   ------------------------------------------------------------------------------------------------------------------ Chemistries  Recent Labs  Lab 01/18/17 1703  01/20/17 0356  NA 142   < > 140  K 4.2   < > 3.6  CL 105   < > 108  CO2 28   < > 26  GLUCOSE 98   < > 76  BUN 22*   < > 17  CREATININE 1.03*   < > 0.80  CALCIUM 8.8*   < > 8.3*  AST 15  --   --   ALT 11*  --   --   ALKPHOS 76  --   --   BILITOT 0.5  --   --    < > = values in this interval not displayed.   RADIOLOGY:  No results found. ASSESSMENT AND PLAN:   1.  Cellulitis of the left forearm.  Started off as a skin tear.  Now with surrounding cellulitis. Continue Vancomycin, wound care nurse for dressing change M/W/F 2.  Dementia with behavioral disturbance: continue Depakote sprinkle twice daily since the patient having trouble swallowing this pill. 3.  Hypothyroidism unspecified continue levothyroxine. 4.  Essential hypertension on Norvasc 5.  Hyperlipidemia unspecified on atorvastatin 6.  Dehydration: continue IV fluid hydration and hold Lasix. 7.  External hemorrhoids Anusol cream    All the records  are reviewed and case discussed with Care Management/Social Worker. Management plans discussed with the patient's daughterJeannine and care giver at bed sideand they are in agreement.  CODE STATUS: DNR  TOTAL TIME TAKING CARE OF THIS PATIENT: 35 minutes.   More than 50% of the time was spent in counseling/coordination of care: YES  POSSIBLE D/C IN 1-2 DAYS, DEPENDING ON CLINICAL CONDITION.   Nicholes Mango M.D on  01/20/2017 at 11:52 AM  Between 7am to 6pm - Pager - [phone]  After 6pm go to [email or web address] - Proofreader  [facility]  Office  [phone]  CC: Primary care physician; Perrin Maltese, MD  Note: This dictation was prepared with Dragon dictation along with smaller phrase technology. Any transcriptional errors that result from this process are unintentional.

## 2017-01-20 NOTE — Progress Notes (Signed)
Clinical Social Worker (CSW) contacted patient's daughter Otila Kluver and presented SNF bed offers. CSW also left list of bed offers with patient's other daughter Mel Almond at bedside. Per Otila Kluver she will review offers and get back to CSW on Monday 01/22/17. CSW will continue to follow and assist as needed.   [employer], LCSW [phone]

## 2017-01-21 LAB — CREATININE, SERUM: CREATININE: 0.84 mg/dL (ref 0.44–1.00)

## 2017-01-21 LAB — URINALYSIS, ROUTINE W REFLEX MICROSCOPIC
Bilirubin Urine: NEGATIVE
Glucose, UA: NEGATIVE mg/dL
KETONES UR: NEGATIVE mg/dL
Nitrite: POSITIVE — AB
PH: 6 (ref 5.0–8.0)
Protein, ur: 100 mg/dL — AB
SPECIFIC GRAVITY, URINE: 1.023 (ref 1.005–1.030)

## 2017-01-21 LAB — VANCOMYCIN, TROUGH: VANCOMYCIN TR: 7 ug/mL — AB (ref 15–20)

## 2017-01-21 MED ORDER — AMLODIPINE BESYLATE 5 MG PO TABS
2.5000 mg | ORAL_TABLET | Freq: Once | ORAL | Status: AC
  Administered 2017-01-21: 2.5 mg via ORAL

## 2017-01-21 MED ORDER — VANCOMYCIN HCL IN DEXTROSE 750-5 MG/150ML-% IV SOLN
750.0000 mg | INTRAVENOUS | Status: DC
Start: 2017-01-21 — End: 2017-01-24
  Administered 2017-01-21 – 2017-01-23 (×3): 750 mg via INTRAVENOUS
  Filled 2017-01-21 (×3): qty 150

## 2017-01-21 MED ORDER — RISAQUAD PO CAPS
2.0000 | ORAL_CAPSULE | Freq: Three times a day (TID) | ORAL | Status: DC
  Administered 2017-01-21 (×2): 2 via ORAL
  Filled 2017-01-21: qty 2

## 2017-01-21 MED ORDER — BACID PO TABS
2.0000 | ORAL_TABLET | Freq: Three times a day (TID) | ORAL | Status: DC
  Administered 2017-01-21: 2 via ORAL
  Filled 2017-01-21 (×3): qty 2

## 2017-01-21 NOTE — Progress Notes (Signed)
[name] NAME: Tammy Henry    MR#:  [id number]  DATE OF BIRTH:  [birth year]  SUBJECTIVE:  CHIEF COMPLAINT:   Chief Complaint  Patient presents with  . Cellulitis  seems comfortable, demented, reports it hurts when she pees .care giver at bedside REVIEW OF SYSTEMS:  Review of Systems  Unable to perform ROS: Dementia  Constitutional: Negative for chills, fever and weight loss.  HENT: Negative for nosebleeds and sore throat.   Eyes: Negative for blurred vision.  Respiratory: Negative for cough, shortness of breath and wheezing.   Cardiovascular: Negative for chest pain, orthopnea, leg swelling and PND.  Gastrointestinal: Negative for abdominal pain, constipation, diarrhea, heartburn, nausea and vomiting.  Genitourinary: Negative for dysuria and urgency.  Musculoskeletal: Negative for back pain.  Skin: Positive for rash.  Neurological: Negative for dizziness, speech change, focal weakness and headaches.  Endo/Heme/Allergies: Does not bruise/bleed easily.  Psychiatric/Behavioral: Negative for depression.    DRUG ALLERGIES:   Allergies  Allergen Reactions  . Lamisil [Terbinafine] Nausea And Vomiting  . Sulfur Nausea And Vomiting   VITALS:  Blood pressure (!) 120/54, pulse 78, temperature 99.3 F (37.4 C), temperature source Oral, resp. rate 16, height 5\' 4"  (1.626 m), weight 45.1 kg (99 lb 8 oz), SpO2 95 %. PHYSICAL EXAMINATION:   GENERAL:  lying in the bed with no acute distress. emasciated EYES: Pupils equal, round, reactive to light and accommodation. No scleral icterus.  HEENT: Head atraumatic, normocephalic. Oropharynx and nasopharynx clear.  NECK:  Supple, no jugular venous distention. No thyroid enlargement, no tenderness.  LUNGS: Nml breath sounds bilaterally, no wheezing, rales,rhonchi ;  No use of accessory muscles of respiration.  CARDIOVASCULAR: S1, S2 normal. No murmurs, rubs, or gallops.  ABDOMEN: Soft,  nontender, nondistended. Bowel sounds present. No organomegaly or mass.  EXTREMITIES: No pedal edema, cyanosis, or clubbing.  NEUROLOGIC:demented, alert PSYCHIATRIC: The patient is alert and oriented x 1.  SKIN: LUE : skin with erythematous wound with some discharge No obvious rash, lesion     LABORATORY PANEL:  Female CBC Recent Labs  Lab 01/20/17 0356  WBC 6.4  HGB 12.1  HCT 36.6  PLT 413   ------------------------------------------------------------------------------------------------------------------ Chemistries  Recent Labs  Lab 01/18/17 1703  01/20/17 0356 01/21/17 0410  NA 142   < > 140  --   K 4.2   < > 3.6  --   CL 105   < > 108  --   CO2 28   < > 26  --   GLUCOSE 98   < > 76  --   BUN 22*   < > 17  --   CREATININE 1.03*   < > 0.80 0.84  CALCIUM 8.8*   < > 8.3*  --   AST 15  --   --   --   ALT 11*  --   --   --   ALKPHOS 76  --   --   --   BILITOT 0.5  --   --   --    < > = values in this interval not displayed.   RADIOLOGY:  No results found. ASSESSMENT AND PLAN:   1.  Cellulitis of the left forearm.  Started off as a skin tear.  Now with surrounding cellulitis. Continue Vancomycin, wound care nurse for dressing change M/W/F.  Probiotics 2.  Dementia with behavioral disturbance: continue Depakote sprinkle twice daily since the patient having trouble swallowing  this pill. 3.  Hypothyroidism unspecified continue levothyroxine. 4.  Essential hypertension on Norvasc 5.  Hyperlipidemia unspecified on atorvastatin 6.  Dehydration: continue IV fluid hydration and hold Lasix. 7.  External hemorrhoids Anusol cream 8.  Dysuria -check  urinalysis culture and sensitivity   All the records are reviewed and case discussed with Care Management/Social Worker. Management plans discussed with the patient's  care giver at bed sideand they are in agreement.  CODE STATUS: DNR  TOTAL TIME TAKING CARE OF THIS PATIENT: 35 minutes.   More than 50% of the time was spent  in counseling/coordination of care: YES  POSSIBLE D/C IN 1-2 DAYS, DEPENDING ON CLINICAL CONDITION.   Nicholes Mango M.D on 01/21/2017 at 12:00 PM  Between 7am to 6pm - Pager - [phone]  After 6pm go to [email or web address] - Proofreader  [facility]  Office  [phone]  CC: Primary care physician; Perrin Maltese, MD  Note: This dictation was prepared with Dragon dictation along with smaller phrase technology. Any transcriptional errors that result from this process are unintentional.

## 2017-01-21 NOTE — Progress Notes (Signed)
Pharmacy Antibiotic Note  Tammy Henry is a 83 y.o. female admitted on 01/18/2017 with cellulitis.  Pharmacy has been consulted for vancomycin dosing.  Plan: 11/18  Vanc Trough 7 - subtherapeutic New kinetics: Ke: 0.050   T1/2: 14  Vd: 31.5  Will adjust dose to Vancomycin 750mg  IV every 24 hours.  Goal trough 10-15 mcg/mL. Estimated trough at [facility] is 11.   Will check levels/adjust dosing as clinically indicated.   Height: 5\' 4"  (162.6 cm) Weight: 99 lb 8 oz (45.1 kg) IBW/kg (Calculated) : 54.7  Temp (24hrs), Avg:99 F (37.2 C), Min:98.8 F (37.1 C), Max:99.3 F (37.4 C)  Recent Labs  Lab 01/18/17 1703 01/18/17 2039 01/19/17 0435 01/20/17 0356 01/21/17 0410 01/21/17 1726  WBC 8.2  --  6.4 6.4  --   --   CREATININE 1.03*  --  0.80 0.80 0.84  --   LATICACIDVEN 1.1 1.5  --   --   --   --   VANCOTROUGH  --   --   --   --   --  7*    Estimated Creatinine Clearance: 34.9 mL/min (by C-G formula based on SCr of 0.84 mg/dL).    Allergies  Allergen Reactions  . Lamisil [Terbinafine] Nausea And Vomiting  . Sulfur Nausea And Vomiting    Antimicrobials this admission: Vancomycin 11/15 >>   Dose adjustments this admission: Vanc 500mg  every 24 hours adjusted on 11/18  Microbiology results:  Thank you for allowing pharmacy to be a part of this patient's care.  Pernell Dupre, PharmD, BCPS Clinical Pharmacist 01/21/2017 6:16 PM

## 2017-01-22 MED ORDER — RISAQUAD PO CAPS
2.0000 | ORAL_CAPSULE | Freq: Three times a day (TID) | ORAL | Status: DC
  Administered 2017-01-22 – 2017-01-23 (×4): 2 via ORAL
  Filled 2017-01-22 (×4): qty 2

## 2017-01-22 MED ORDER — DEXTROSE 5 % IV SOLN
1.0000 g | INTRAVENOUS | Status: DC
  Administered 2017-01-22 – 2017-01-23 (×2): 1 g via INTRAVENOUS
  Filled 2017-01-22 (×2): qty 10

## 2017-01-22 NOTE — Clinical Social Work Note (Signed)
CSW spoke with patient's daughter, Otila Kluver, this afternoon and helped her processing her husband's funeral yesterday and decisions surrounding her mother. CSW extended the bed offers and Otila Kluver is going to come to the hospital tomorrow and let me know of her decision. Shela Leff MSW,LCSW [phone]

## 2017-01-22 NOTE — Consult Note (Signed)
[facility] Infectious Disease     Reason for Consult:Wound infecton    Referring Physician: Gouru, A Date of Admission:  01/18/2017   Active Problems:   Cellulitis   HPI: Tammy Henry is a 83 y.o. female admitted with increasing infection of a L arm wound as well as dysuria.  She has dementia and apparently developed a skin tear on L arm about 2 weeks ago with increasing redness and tenderness. She has not had any abx per report. On admit wbc was 8 and has no fevers. Cx of wound with Staph aureus,  UCX E coli, UA with tntc wbc   Past Medical History:  Diagnosis Date  . Acute kidney failure, unspecified ([place])   . Anxiety state, unspecified   . Cachexia ([place])   . Dehydration   . Depressive disorder, not elsewhere classified   . Early satiety   . Edema   . Essential hypertension, benign   . Functional diarrhea   . Iron deficiency anemia, unspecified   . Nonspecific abnormal electrocardiogram (ECG) (EKG)   . Osteoporosis, unspecified   . Other and unspecified disc disorder of cervical region   . Other frontotemporal dementia   . Other psoriasis   . Other specified disorders of shoulder joint   . Unspecified hypothyroidism   . Unspecified pruritic disorder   . Unspecified venous (peripheral) insufficiency   . Urinary tract infection, site not specified    Past Surgical History:  Procedure Laterality Date  . CATARACT EXTRACTION, BILATERAL     Social History   Tobacco Use  . Smoking status: Never Smoker  . Smokeless tobacco: Never Used  Substance Use Topics  . Alcohol use: No  . Drug use: No   Family History  Problem Relation Age of Onset  . Lymphoma Mother   . Alzheimer's disease Mother   . Alzheimer's disease Father   . Breast cancer Sister   . Depression Daughter   . Asthma Daughter   . Schizophrenia Daughter   . Thyroid disease Daughter     Allergies:  Allergies  Allergen Reactions  . Lamisil [Terbinafine] Nausea And Vomiting  . Sulfur Nausea And  Vomiting    Current antibiotics: Antibiotics Given (last 72 hours)    Date/Time Action Medication Dose Rate   01/19/17 1827 New Bag/Given   vancomycin (VANCOCIN) 500 mg in sodium chloride 0.9 % 100 mL IVPB 500 mg 100 mL/hr   01/20/17 1844 New Bag/Given   vancomycin (VANCOCIN) 500 mg in sodium chloride 0.9 % 100 mL IVPB 500 mg 100 mL/hr   01/21/17 1846 New Bag/Given   vancomycin (VANCOCIN) IVPB 750 mg/150 ml premix 750 mg 150 mL/hr      MEDICATIONS: . acidophilus  2 capsule Oral TID  . amLODipine  5 mg Oral Daily  . atorvastatin  20 mg Oral QHS  . calcium-vitamin D  1 tablet Oral Daily  . cholecalciferol  1,000 Units Oral Daily  . citalopram  10 mg Oral [id number]  . divalproex  250 mg Oral BID  . enoxaparin (LOVENOX) injection  30 mg Subcutaneous Q24H  . feeding supplement (ENSURE ENLIVE)  237 mL Oral BID BM  . hydrocortisone   Rectal QID  . hydrOXYzine  25 mg Oral BID  . levothyroxine  50 mcg Oral QAC breakfast  . mupirocin ointment   Topical Daily    Review of Systems - 11 systems reviewed and negative per HPI   OBJECTIVE: Temp:  [98.8 F (37.1 C)-99.5 F (37.5 C)] 99.5  F (37.5 C) (11/19 1301) Pulse Rate:  [67-84] 84 (11/19 1301) Resp:  [16-19] 16 (11/19 0512) BP: (109-142)/(55-70) 125/70 (11/19 1301) SpO2:  [93 %-94 %] 93 % (11/19 1301) Physical Exam  Constitutional:  Frail, thin, lying in bed, unable to report history. Caregiver at bedside HENT: [place]/AT, PERRLA, no scleral icterus Mouth/Throat: Oropharynx is clear and dry. No oropharyngeal exudate.  Cardiovascular: Normal rate, regular rhythm and normal heart sounds. Exam reveals no gallop and no friction rub.  No murmur heard.  Pulmonary/Chest: Effort normal and breath sounds normal. No respiratory distress.  has no wheezes.  Neck = supple, no nuchal rigidity Abdominal: Soft. Bowel sounds are normal.  exhibits no distension. There is no tenderness.  Lymphadenopathy: no cervical adenopathy. No axillary  adenopathy Neurological: alert and oriented to person, place, and time.  Skin: L arm with a raw appearing wound with some necrotic tissue and  Psychiatric: a normal mood and affect.  behavior is normal.    LABS: Results for orders placed or performed during the hospital encounter of 01/18/17 (from the past 48 hour(s))  Creatinine, serum     Status: None   Collection Time: 01/21/17  4:10 AM  Result Value Ref Range   Creatinine, Ser 0.84 0.44 - 1.00 mg/dL   GFR calc non Af Amer >60 >60 mL/min   GFR calc Af Amer >60 >60 mL/min    Comment: (NOTE) The eGFR has been calculated using the CKD EPI equation. This calculation has not been validated in all clinical situations. eGFR's persistently <60 mL/min signify possible Chronic Kidney Disease.   Urine Culture     Status: Abnormal (Preliminary result)   Collection Time: 01/21/17 11:49 AM  Result Value Ref Range   Specimen Description URINE, RANDOM    Special Requests Normal    Culture >=100,000 COLONIES/mL ESCHERICHIA COLI (A)    Report Status PENDING   Urinalysis, Routine w reflex microscopic     Status: Abnormal   Collection Time: 01/21/17 12:08 PM  Result Value Ref Range   Color, Urine AMBER (A) YELLOW    Comment: BIOCHEMICALS Broker BE AFFECTED BY COLOR   APPearance CLOUDY (A) CLEAR   Specific Gravity, Urine 1.023 1.005 - 1.030   pH 6.0 5.0 - 8.0   Glucose, UA NEGATIVE NEGATIVE mg/dL   Hgb urine dipstick SMALL (A) NEGATIVE   Bilirubin Urine NEGATIVE NEGATIVE   Ketones, ur NEGATIVE NEGATIVE mg/dL   Protein, ur 100 (A) NEGATIVE mg/dL   Nitrite POSITIVE (A) NEGATIVE   Leukocytes, UA LARGE (A) NEGATIVE   RBC / HPF 6-30 0 - 5 RBC/hpf   WBC, UA TOO NUMEROUS TO COUNT 0 - 5 WBC/hpf   Bacteria, UA MANY (A) NONE SEEN   Squamous Epithelial / LPF 0-5 (A) NONE SEEN   WBC Clumps PRESENT   Vancomycin, trough     Status: Abnormal   Collection Time: 01/21/17  5:26 PM  Result Value Ref Range   Vancomycin Tr 7 (L) 15 - 20 ug/mL   No  components found for: ESR, C REACTIVE PROTEIN MICRO: Recent Results (from the past 720 hour(s))  Aerobic/Anaerobic Culture (surgical/deep wound)     Status: None (Preliminary result)   Collection Time: 01/20/17 11:38 AM  Result Value Ref Range Status   Specimen Description WOUND  Final   Special Requests FOREARM  Final   Gram Stain   Final    FEW WBC PRESENT, PREDOMINANTLY PMN RARE GRAM POSITIVE COCCI IN CLUSTERS RARE GRAM NEGATIVE RODS    Culture  Final    MODERATE STAPHYLOCOCCUS AUREUS SUSCEPTIBILITIES TO FOLLOW Performed at [facility], [place] [address], [place], [place] [address]    Report Status PENDING  Incomplete  Urine Culture     Status: Abnormal (Preliminary result)   Collection Time: 01/21/17 11:49 AM  Result Value Ref Range Status   Specimen Description URINE, RANDOM  Final   Special Requests Normal  Final   Culture >=100,000 COLONIES/mL ESCHERICHIA COLI (A)  Final   Report Status PENDING  Incomplete    IMAGING: No results found.  Assessment:   Tiffeny Minchew Dalziel is a 83 y.o. female with L forearm wound and cellulitis with cx growing staph aureus and E coli UTI. SHe has hx dementia. ABx so far has been vancomycin.   Recommendations Add ceftriaxone since has E coli UTI COnt vanco pending sensitivities Likely can treat with oral abx based on sensistivities.  Thank you very much for allowing me to participate in the care of this patient. Please call with questions.   Cheral Marker. Ola Spurr, MD

## 2017-01-22 NOTE — Progress Notes (Addendum)
[place] at [place] NAME: Tammy Henry    MR#:  [id number]  DATE OF BIRTH:  Nov 19, 1929  SUBJECTIVE:  CHIEF COMPLAINT:   Chief Complaint  Patient presents with  . Cellulitis  seems comfortable, demented, daughter and care giver at bedside.  Soft stool according to the nurses REVIEW OF SYSTEMS:  Review of Systems  Unable to perform ROS: Dementia  Constitutional: Negative for chills, fever and weight loss.  HENT: Negative for nosebleeds and sore throat.   Eyes: Negative for blurred vision.  Respiratory: Negative for cough, shortness of breath and wheezing.   Cardiovascular: Negative for chest pain, orthopnea, leg swelling and PND.  Gastrointestinal: Negative for abdominal pain, constipation, diarrhea, heartburn, nausea and vomiting.  Genitourinary: Negative for dysuria and urgency.  Musculoskeletal: Negative for back pain.  Skin: Positive for rash.  Neurological: Negative for dizziness, speech change, focal weakness and headaches.  Endo/Heme/Allergies: Does not bruise/bleed easily.  Psychiatric/Behavioral: Negative for depression.    DRUG ALLERGIES:   Allergies  Allergen Reactions  . Lamisil [Terbinafine] Nausea And Vomiting  . Sulfur Nausea And Vomiting   VITALS:  Blood pressure (!) 142/57, pulse 75, temperature 99.5 F (37.5 C), temperature source Axillary, resp. rate 16, height 5\' 4"  (1.626 m), weight 45.1 kg (99 lb 8 oz), SpO2 94 %. PHYSICAL EXAMINATION:   GENERAL:  lying in the bed with no acute distress. emasciated EYES: Pupils equal, round, reactive to light and accommodation. No scleral icterus.  HEENT: Head atraumatic, normocephalic. Oropharynx and nasopharynx clear.  NECK:  Supple, no jugular venous distention. No thyroid enlargement, no tenderness.  LUNGS: Nml breath sounds bilaterally, no wheezing, rales,rhonchi ;  No use of accessory muscles of respiration.  CARDIOVASCULAR: S1, S2 normal. No murmurs, rubs, or  gallops.  ABDOMEN: Soft, nontender, nondistended. Bowel sounds present. No organomegaly or mass.  EXTREMITIES: No pedal edema, cyanosis, or clubbing.  NEUROLOGIC:demented, alert PSYCHIATRIC: The patient is alert and oriented x 1.  SKIN: LUE : skin with erythematous wound with some discharge No obvious rash, lesion     LABORATORY PANEL:  Female CBC Recent Labs  Lab 01/20/17 0356  WBC 6.4  HGB 12.1  HCT 36.6  PLT 413   ------------------------------------------------------------------------------------------------------------------ Chemistries  Recent Labs  Lab 01/18/17 1703  01/20/17 0356 01/21/17 0410  NA 142   < > 140  --   K 4.2   < > 3.6  --   CL 105   < > 108  --   CO2 28   < > 26  --   GLUCOSE 98   < > 76  --   BUN 22*   < > 17  --   CREATININE 1.03*   < > 0.80 0.84  CALCIUM 8.8*   < > 8.3*  --   AST 15  --   --   --   ALT 11*  --   --   --   ALKPHOS 76  --   --   --   BILITOT 0.5  --   --   --    < > = values in this interval not displayed.   RADIOLOGY:  No results found. ASSESSMENT AND PLAN:   1.  Cellulitis of the left forearm.  Started off as a skin tear.  Now with surrounding cellulitis. Continue Vancomycin, wound care nurse for dressing change M/W/F.  Probiotics Wound culture with gram-positive cocci in pairs and  Rods Consult infectious disease 2.  Dementia with behavioral disturbance: continue Depakote sprinkle twice daily since the patient having trouble swallowing this pill. 3.  Hypothyroidism unspecified continue levothyroxine. 4.  Essential hypertension on Norvasc 5.  Hyperlipidemia unspecified on atorvastatin 6.  Dehydration: continue IV fluid hydration and hold Lasix. 7.  External hemorrhoids Anusol cream 8.  Abnormal urinalysis-check  urinalysis culture and sensitivity  PT is recommending skilled nursing facility, follow-up with family and social worker Dysphagia 1 diet Disposition - snf  All the records are reviewed and case discussed  with Care Management/Social Worker. Management plans discussed with the patient's  care giver,daughter [name] [phone] at bed sideand they are in agreement.  CODE STATUS: DNR  TOTAL TIME TAKING CARE OF THIS PATIENT: 35 minutes.   More than 50% of the time was spent in counseling/coordination of care: YES  POSSIBLE D/C IN 1-2 DAYS, DEPENDING ON CLINICAL CONDITION.   Nicholes Mango M.D on 01/22/2017 at 9:02 AM  Between 7am to 6pm - Pager - [phone]  After 6pm go to [email or web address] - Proofreader  [facility]  Office  [phone]  CC: Primary care physician; Perrin Maltese, MD  Note: This dictation was prepared with Dragon dictation along with smaller phrase technology. Any transcriptional errors that result from this process are unintentional.

## 2017-01-23 LAB — CREATININE, SERUM
CREATININE: 0.85 mg/dL (ref 0.44–1.00)
GFR calc Af Amer: >60 mL/min (ref >60)

## 2017-01-23 LAB — URINE CULTURE: SPECIAL REQUESTS: NORMAL

## 2017-01-23 LAB — C DIFFICILE QUICK SCREEN W PCR REFLEX
C DIFFICLE (CDIFF) ANTIGEN: NEGATIVE
C Diff interpretation: NOT DETECTED
C Diff toxin: NEGATIVE

## 2017-01-23 MED ORDER — AMLODIPINE BESYLATE 5 MG PO TABS
2.5000 mg | ORAL_TABLET | Freq: Once | ORAL | Status: AC
  Administered 2017-01-23: 2.5 mg via ORAL
  Filled 2017-01-23: qty 1

## 2017-01-23 MED ORDER — ENSURE ENLIVE PO LIQD: 237.0000 mL | Freq: Three times a day (TID) | ORAL | Status: DC

## 2017-01-23 MED ORDER — OCUVITE-LUTEIN PO CAPS
1.0000 | ORAL_CAPSULE | Freq: Every day | ORAL | Status: DC
  Administered 2017-01-23: 1 via ORAL
  Filled 2017-01-23 (×2): qty 1

## 2017-01-23 NOTE — Evaluation (Signed)
Physical Therapy Evaluation Patient Details Name: Tammy Henry MRN: [id number] DOB: Jun 06, 1929 Today's Date: 01/23/2017   History of Present Illness  Pt is an 83 y.o. female presenting to hospital with redness/swelling and pain to L UE from skin tear.  Pt admitted with purulent cellulitis L forearm.  PMH includes acute kidney failure, cachexia, iron deficiency anemia, psoriasis, UTI, and frontotemporal dementia.  Clinical Impression  Prior to hospital admission, pt fluctuated with assist levels (with 1 person assist) with bed mobility, transfers, and limited short household ambulation with RW; required 2-3 person assist to navigate stairs in/out of home.  Pt lives with her daughter and has 24/7 caregiver assist.  Currently pt requires 2 assist to transfer bed to recliner and appears below baseline (pt typically only requires 1 assist with mobility).  Pt also with extended hospitalization (admit 11/15) and appears to have had limited mobility since admission (pt appearing with generalized weakness).  Currently d/t pt appearing below baseline in terms of functional mobility, will trial pt with PT 2-3 sessions during hospitalization, monitor pt's progress with PT, and will update PT discharge/follow-up recommendations as appropriate depending on pt's progress.   Pt would benefit from skilled PT to trial addressing noted impairments and functional limitations (see below for any additional details).    Follow Up Recommendations SNF(Trial PT 2-3 sessions)    Equipment Recommendations  Rolling walker with 5" wheels    Recommendations for Other Services       Precautions / Restrictions Precautions Precautions: Fall Restrictions Weight Bearing Restrictions: No      Mobility  Bed Mobility Overal bed mobility: Needs Assistance Bed Mobility: Supine to Sit     Supine to sit: HOB elevated;Max assist     General bed mobility comments: assist for LE's and trunk; vc's for technique; assist to  scoot to edge of bed  Transfers Overall transfer level: Needs assistance Equipment used: Rolling walker (2 wheeled) Transfers: Sit to/from Stand Sit to Stand: Mod assist;+2 safety/equipment         General transfer comment: x2 trials; vc's and tactile cues for UE and LE positioning; pt with B knees flexed in standing requiring cueing to straighten  Ambulation/Gait Ambulation/Gait assistance: Mod assist;+2 physical assistance Ambulation Distance (Feet): 3 Feet Assistive device: Rolling walker (2 wheeled)   Gait velocity: decreased   General Gait Details: B knees flexed; vc's and assist to shift weight and take steps bed to recliner with 2 assist  Stairs            Wheelchair Mobility    Modified Rankin (Stroke Patients Only)       Balance Overall balance assessment: Needs assistance Sitting-balance support: Bilateral upper extremity supported;Feet supported Sitting balance-Leahy Scale: Poor Sitting balance - Comments: L lateral posterior lean in sitting requiring mod assist for sitting balance Postural control: Posterior lean;Left lateral lean Standing balance support: Bilateral upper extremity supported Standing balance-Leahy Scale: Poor Standing balance comment: requires UE support on RW; B knees flexed requiring assist for upright                             Pertinent Vitals/Pain Pain Assessment: Faces Faces Pain Scale: Hurts a little bit Pain Location: L LE (no specific place) Pain Descriptors / Indicators: Sore Pain Intervention(s): Limited activity within patient's tolerance;Monitored during session;Repositioned  Vitals (HR and O2 on room air) stable and WFL throughout treatment session.    Home Living Family/patient expects to be discharged to::  Private residence Living Arrangements: Children(Pt's daughter) Available Help at Discharge: Family;Personal care attendant;Available 24 hours/day Type of Home: House Home Access: Stairs to  enter Entrance Stairs-Rails: Right;Left(can only reach one rail at a time) Technical brewer of Steps: 5-6 Home Layout: Multi-level;Able to live on main level with bedroom/bathroom Home Equipment: Gilford Rile - 2 wheels;Cane - single point;Bedside commode;Wheelchair - manual      Prior Function Level of Independence: Needs assistance   Gait / Transfers Assistance Needed: Pt ambulates 40-50 feet maximum with RW (close SBA to pt requiring physical assist with ambulation) and then pt requires to be pushed in w/c back from bathroom d/t fatigue from ambulating;  usually leans to R side (gets propped up with pillow sitting edge of bed) and requires assist with bed mobility; most caregivers only transfer pt to commode (don't walk with pt d/t safety concerns/falling) and pt requires minimal to significant assist with transfers last few months; requires 2-3 assist to navigate stairs in/out of home and pt then sits in w/c  ADL's / Homemaking Assistance Needed: Pt eats pureed diet and occasionally feeds self; gets sponge baths; assist with dressing  Comments: Uses w/c more than ambulates with walker.     Hand Dominance        Extremity/Trunk Assessment   Upper Extremity Assessment Upper Extremity Assessment: Difficult to assess due to impaired cognition;Generalized weakness    Lower Extremity Assessment Lower Extremity Assessment: Difficult to assess due to impaired cognition;Generalized weakness       Communication   Communication: No difficulties  Cognition Arousal/Alertness: Awake/alert Behavior During Therapy: Flat affect Overall Cognitive Status: History of cognitive impairments - at baseline(Oriented to self (except 1 year off of year of birth))                                        General Comments General comments (skin integrity, edema, etc.): Pt's 2 daughter's and caregiver present beginning of session.  Pt's daughter's left during mobility portion of session  but pt's caregiver stayed in room observing.    Exercises  Transfer training   Assessment/Plan    PT Assessment Patient needs continued PT services  PT Problem List Decreased strength;Decreased activity tolerance;Decreased balance;Decreased mobility;Decreased knowledge of use of DME       PT Treatment Interventions DME instruction;Gait training;Stair training;Functional mobility training;Therapeutic activities;Therapeutic exercise;Balance training;Patient/family education    PT Goals (Current goals can be found in the Care Plan section)  Acute Rehab PT Goals Patient Stated Goal: to improve mobility PT Goal Formulation: With patient/family Time For Goal Achievement: 02/06/17 Potential to Achieve Goals: Fair    Frequency Min 2X/week   Barriers to discharge   Question level of physical assist available.    Co-evaluation               AM-PAC PT "6 Clicks" Daily Activity  Outcome Measure Difficulty turning over in bed (including adjusting bedclothes, sheets and blankets)?: Unable Difficulty moving from lying on back to sitting on the side of the bed? : Unable Difficulty sitting down on and standing up from a chair with arms (e.g., wheelchair, bedside commode, etc,.)?: Unable Help needed moving to and from a bed to chair (including a wheelchair)?: Total Help needed walking in hospital room?: Total Help needed climbing 3-5 steps with a railing? : Total 6 Click Score: 6    End of Session Equipment Utilized During Treatment: Gait belt  Activity Tolerance: Patient limited by fatigue Patient left: in chair;with call bell/phone within reach;with chair alarm set;with nursing/sitter in room;with family/visitor present Nurse Communication: Mobility status;Precautions PT Visit Diagnosis: Other abnormalities of gait and mobility (R26.89);Muscle weakness (generalized) (M62.81)    Time: 1000-1035 PT Time Calculation (min) (ACUTE ONLY): 35 min   Charges:   PT Evaluation $PT Eval  Low Complexity: 1 Low PT Treatments $Therapeutic Activity: 8-22 mins   PT G Codes:   PT G-Codes **NOT FOR INPATIENT CLASS** Functional Assessment Tool Used: AM-PAC 6 Clicks Basic Mobility Functional Limitation: Mobility: Walking and moving around Mobility: Walking and Moving Around Current Status (B4037): 100 percent impaired, limited or restricted Mobility: Walking and Moving Around Goal Status (Q9643): At least 60 percent but less than 80 percent impaired, limited or restricted    Leitha Bleak, PT 01/23/17, 2:01 PM [phone]

## 2017-01-23 NOTE — Care Management Important Message (Signed)
Important Message  Patient Details  Name: Tammy Henry MRN: [id number] Date of Birth: June 03, 1929   Medicare Important Message Given:  Yes    Marshell Garfinkel, RN 01/23/2017, 8:57 AM

## 2017-01-23 NOTE — Progress Notes (Signed)
[facility] [place] NAME: Tammy Henry    MR#:  [id number]  DATE OF BIRTH:  [birth year]  SUBJECTIVE:  CHIEF COMPLAINT:   Chief Complaint  Patient presents with  . Cellulitis  febrile 101 last night,demented, 2 daughters and care giver at bedside.  Soft stool according to the nurses REVIEW OF SYSTEMS:  Review of Systems  Unable to perform ROS: Dementia  Constitutional: Negative for chills, fever and weight loss.  HENT: Negative for nosebleeds and sore throat.   Eyes: Negative for blurred vision.  Respiratory: Negative for cough, shortness of breath and wheezing.   Cardiovascular: Negative for chest pain, orthopnea, leg swelling and PND.  Gastrointestinal: Negative for abdominal pain, constipation, diarrhea, heartburn, nausea and vomiting.  Genitourinary: Negative for dysuria and urgency.  Musculoskeletal: Negative for back pain.  Skin: Positive for rash.  Neurological: Negative for dizziness, speech change, focal weakness and headaches.  Endo/Heme/Allergies: Does not bruise/bleed easily.  Psychiatric/Behavioral: Negative for depression.    DRUG ALLERGIES:   Allergies  Allergen Reactions  . Lamisil [Terbinafine] Nausea And Vomiting  . Sulfur Nausea And Vomiting   VITALS:  Blood pressure (!) 133/52, pulse 64, temperature (!) 97.2 F (36.2 C), resp. rate 16, height 5\' 4"  (1.626 m), weight 45.1 kg (99 lb 8 oz), SpO2 93 %. PHYSICAL EXAMINATION:   GENERAL:  lying in the bed with no acute distress. emasciated EYES: Pupils equal, round, reactive to light and accommodation. No scleral icterus.  HEENT: Head atraumatic, normocephalic. Oropharynx and nasopharynx clear.  NECK:  Supple, no jugular venous distention. No thyroid enlargement, no tenderness.  LUNGS: Nml breath sounds bilaterally, no wheezing, rales,rhonchi ;  No use of accessory muscles of respiration.  CARDIOVASCULAR: S1, S2 normal. No murmurs, rubs, or gallops.  ABDOMEN:  Soft, nontender, nondistended. Bowel sounds present. No organomegaly or mass.  EXTREMITIES: No pedal edema, cyanosis, or clubbing.  NEUROLOGIC:demented, alert PSYCHIATRIC: The patient is alert and oriented x 1.  SKIN: LUE : skin with erythematous wound with some discharge No obvious rash, lesion     LABORATORY PANEL:  Female CBC Recent Labs  Lab 01/20/17 0356  WBC 6.4  HGB 12.1  HCT 36.6  PLT 413   ------------------------------------------------------------------------------------------------------------------ Chemistries  Recent Labs  Lab 01/18/17 1703  01/20/17 0356  01/23/17 0459  NA 142   < > 140  --   --   K 4.2   < > 3.6  --   --   CL 105   < > 108  --   --   CO2 28   < > 26  --   --   GLUCOSE 98   < > 76  --   --   BUN 22*   < > 17  --   --   CREATININE 1.03*   < > 0.80   < > 0.85  CALCIUM 8.8*   < > 8.3*  --   --   AST 15  --   --   --   --   ALT 11*  --   --   --   --   ALKPHOS 76  --   --   --   --   BILITOT 0.5  --   --   --   --    < > = values in this interval not displayed.   RADIOLOGY:  No results found. ASSESSMENT AND PLAN:   1.  Cellulitis of the left  forearm.  Started off as a skin tear.  Now with surrounding cellulitis. Continue Vancomycin, wound care nurse for dressing change M/W/F.  Probiotics Febrile last night Wound culture with MRSA sensitive to vancomycin , Clinda and Bactrim Appreciate infectious disease recommendations and follow-up with them 2.  Acute cystitis: Urine culture with E. coli sensitive to Rocephin  3.  Hypothyroidism unspecified continue levothyroxine. 4.  Essential hypertension on Norvasc 5.  Hyperlipidemia unspecified on atorvastatin 6.  Dehydration: continue IV fluid hydration and hold Lasix. 7.  External hemorrhoids Anusol cream 8.  Dementia with behavioral disturbance: continue Depakote sprinkle twice daily since the patient having trouble swallowing this pill. PT is recommending skilled nursing facility, follow-up  with 2 daughters and social worker Dysphagia 1 diet Disposition - snf-anticipate to [facility] in a.m. if patient is afebrile for 24 hours  All the records are reviewed and case discussed with Care Management/Social Worker. Management plans discussed with the patient's  care giver,daughter [name] [phone] at bed sideand they are in agreement.  CODE STATUS: DNR  TOTAL TIME TAKING CARE OF THIS PATIENT: 35 minutes.   More than 50% of the time was spent in counseling/coordination of care: YES  POSSIBLE D/C IN 1-2 DAYS, DEPENDING ON CLINICAL CONDITION.   Nicholes Mango M.D on 01/23/2017 at 2:23 PM  Between 7am to 6pm - Pager - [phone]  After 6pm go to [email or web address] - Proofreader  [facility]  Office  [phone]  CC: Primary care physician; Perrin Maltese, MD  Note: This dictation was prepared with Dragon dictation along with smaller phrase technology. Any transcriptional errors that result from this process are unintentional.

## 2017-01-23 NOTE — Progress Notes (Signed)
Nutrition Follow Up Note   DOCUMENTATION CODES:   Severe malnutrition in context of chronic illness  INTERVENTION:   [name] benefit from daily probiotics- will order daily yogurt  Ensure Enlive po TID, each supplement provides 350 kcal and 20 grams of protein  Magic cup TID with meals, each supplement provides 290 kcal and 9 grams of protein  Dysphagia 1 diet  Snacks  Ocuvite daily for wound healing (provides zinc, vitamin A, vitamin C, Vitamin E, copper, and selenium)  NUTRITION DIAGNOSIS:   Severe Malnutrition related to poor appetite(dementia, wounds) as evidenced by severe fat depletion, severe muscle depletion.  GOAL:   Patient will meet greater than or equal to 90% of their needs  MONITOR:   PO intake, Supplement acceptance, Labs, Weight trends, Skin, I & O's  REASON FOR ASSESSMENT:   Malnutrition Screening Tool    ASSESSMENT:   83 y.o. female presents from home for evaluation of redness swelling and pain to the left upper extremity.   Met with pt, pt's 2 daughters, and pt's care giver in room today. Pt with dementia so history obtained from pt's daughter and caregiver. Pt with a slow decrease in appetite as she has aged. Pt does eat but prefers multiple small meals as opposed to 3 large meals. Pt eats a pureed diet at home. Pt has caregivers around the clock who prepare her meals. Per chart, pt has gained some weight since last year; pt's daughter confirms weight gain. In hospital, pt has been eating 25-85% of meals. Per daughter, pt ate most of her breakfast this morning. Pt has been drinking some Ensure as well. Pt with diarrhea since admit; diarrhea started before pt received any Ensure. Ensure is lactose free and should not cause any stomach upset from "milk intolerance". Recommend daily probiotics to help with diarrhea; will order daily yogurt. Educated family and caregivers on ways to increase protein intake. RD will add Magic Cups and snacks. RD will also order  daily Ocuvite to aid in wound healing.      Diet Order:  DIET - DYS 1 Room service appropriate? Yes; Fluid consistency: Thin  EDUCATION NEEDS:   Education needs have been addressed(with caregivers )  Skin:  Reviewed RN Assessment  Last BM:  11/20- TYPE 6  Height:   Ht Readings from Last 1 Encounters:  01/18/17 _0  (1.626 m)    Weight:   Wt Readings from Last 1 Encounters:  01/18/17 99 lb 8 oz (45.1 kg)    Ideal Body Weight:  54.5 kg  BMI:  Body mass index is 17.08 kg/m.  Estimated Nutritional Needs:   Kcal:  1300-1600kcal/day   Protein:  68-76g/day   Fluid:  >1.6L/day   Tammy Distance MS, RD, LDN Pager #[phone] After Hours Pager: [phone]

## 2017-01-23 NOTE — Clinical Social Work Note (Signed)
CSW met with patient's daughter, Otila Kluver, for an hour this afternoon and Broadus John with [facility] was in some of that meeting. [name]'s questions were answered. Supportive counseling offered as well. Patient will discharge to [facility] at discharge. Shela Leff MSW,LCSW [phone]

## 2017-01-23 NOTE — Progress Notes (Addendum)
[facility] INFECTIOUS DISEASE PROGRESS NOTE Date of Admission:  01/18/2017     ID: Tammy Henry is a 83 y.o. female with  Arm celluliitis Active Problems:   Cellulitis   Subjective: No change, family at bedside  ROS  Eleven systems are reviewed and negative except per hpi  Medications:  Antibiotics Given (last 72 hours)    Date/Time Action Medication Dose Rate   01/20/17 1844 New Bag/Given   vancomycin (VANCOCIN) 500 mg in sodium chloride 0.9 % 100 mL IVPB 500 mg 100 mL/hr   01/21/17 1846 New Bag/Given   vancomycin (VANCOCIN) IVPB 750 mg/150 ml premix 750 mg 150 mL/hr   01/22/17 1835 New Bag/Given   vancomycin (VANCOCIN) IVPB 750 mg/150 ml premix 750 mg 150 mL/hr   01/22/17 2300 New Bag/Given   cefTRIAXone (ROCEPHIN) 1 g in dextrose 5 % 50 mL IVPB 1 g 100 mL/hr     . acidophilus  2 capsule Oral TID  . amLODipine  5 mg Oral Daily  . atorvastatin  20 mg Oral QHS  . calcium-vitamin D  1 tablet Oral Daily  . cholecalciferol  1,000 Units Oral Daily  . citalopram  10 mg Oral BH-q7a  . divalproex  250 mg Oral BID  . enoxaparin (LOVENOX) injection  30 mg Subcutaneous Q24H  . feeding supplement (ENSURE ENLIVE)  237 mL Oral TID BM  . hydrocortisone   Rectal QID  . hydrOXYzine  25 mg Oral BID  . levothyroxine  50 mcg Oral QAC breakfast  . multivitamin-lutein  1 capsule Oral Daily  . mupirocin ointment   Topical Daily    Objective: Vital signs in last 24 hours: Temp:  [97.2 F (36.2 C)-101.6 F (38.7 C)] 97.2 F (36.2 C) (11/20 1407) Pulse Rate:  [64-89] 64 (11/20 1407) Resp:  [16-20] 16 (11/20 1407) BP: (124-144)/(51-91) 133/52 (11/20 1407) SpO2:  [92 %-95 %] 93 % (11/20 1407) Constitutional:  Frail, thin, lying in bed, unable to report history. Caregiver at bedside HENT: [place]/AT, PERRLA, no scleral icterus Mouth/Throat: Oropharynx is clear and dry. No oropharyngeal exudate.  Cardiovascular: Normal rate, regular rhythm and normal heart sounds. Exam reveals no gallop  and no friction rub.  No murmur heard.  Pulmonary/Chest: Effort normal and breath sounds normal. No respiratory distress.  has no wheezes.  Neck = supple, no nuchal rigidity Abdominal: Soft. Bowel sounds are normal.  exhibits no distension. There is no tenderness.  Lymphadenopathy: no cervical adenopathy. No axillary adenopathy Neurological: alert and oriented to person, place, and time.  Skin: L arm with a raw appearing wound with some necrotic tissue and mild redness Psychiatric: a normal mood and affect.  behavior is normal.    Lab Results Recent Labs    01/21/17 0410 01/23/17 0459  CREATININE 0.84 0.85    Microbiology: Results for orders placed or performed during the hospital encounter of 01/18/17  Aerobic/Anaerobic Culture (surgical/deep wound)     Status: None (Preliminary result)   Collection Time: 01/20/17 11:38 AM  Result Value Ref Range Status   Specimen Description WOUND  Final   Special Requests FOREARM  Final   Gram Stain   Final    FEW WBC PRESENT, PREDOMINANTLY PMN RARE GRAM POSITIVE COCCI IN CLUSTERS RARE GRAM NEGATIVE RODS Performed at [facility], [place] [address] [place], [place] [address]    Culture   Final    MODERATE METHICILLIN RESISTANT STAPHYLOCOCCUS AUREUS NO ANAEROBES ISOLATED; CULTURE IN PROGRESS FOR 5 DAYS    Report Status  PENDING  Incomplete   Organism ID, Bacteria METHICILLIN RESISTANT STAPHYLOCOCCUS AUREUS  Final      Susceptibility   Methicillin resistant staphylococcus aureus - MIC*    CIPROFLOXACIN >=8 RESISTANT Resistant     ERYTHROMYCIN >=8 RESISTANT Resistant     GENTAMICIN <=0.5 SENSITIVE Sensitive     OXACILLIN >=4 RESISTANT Resistant     TETRACYCLINE <=1 SENSITIVE Sensitive     VANCOMYCIN 1 SENSITIVE Sensitive     TRIMETH/SULFA <=10 SENSITIVE Sensitive     CLINDAMYCIN <=0.25 SENSITIVE Sensitive     RIFAMPIN <=0.5 SENSITIVE Sensitive     Inducible Clindamycin NEGATIVE Sensitive     * MODERATE METHICILLIN RESISTANT  STAPHYLOCOCCUS AUREUS  Urine Culture     Status: Abnormal   Collection Time: 01/21/17 11:49 AM  Result Value Ref Range Status   Specimen Description URINE, RANDOM  Final   Special Requests Normal  Final   Culture >=100,000 COLONIES/mL ESCHERICHIA COLI (A)  Final   Report Status 01/23/2017 FINAL  Final   Organism ID, Bacteria ESCHERICHIA COLI (A)  Final      Susceptibility   Escherichia coli - MIC*    AMPICILLIN >=32 RESISTANT Resistant     CEFAZOLIN <=4 SENSITIVE Sensitive     CEFTRIAXONE <=1 SENSITIVE Sensitive     CIPROFLOXACIN >=4 RESISTANT Resistant     GENTAMICIN >=16 RESISTANT Resistant     IMIPENEM <=0.25 SENSITIVE Sensitive     NITROFURANTOIN <=16 SENSITIVE Sensitive     TRIMETH/SULFA >=320 RESISTANT Resistant     AMPICILLIN/SULBACTAM >=32 RESISTANT Resistant     PIP/TAZO <=4 SENSITIVE Sensitive     Extended ESBL NEGATIVE Sensitive     * >=100,000 COLONIES/mL ESCHERICHIA COLI    Studies/Results: No results found.  Assessment/Plan: Tammy Henry is a 83 y.o. female with L forearm wound and cellulitis with cx growing Methicillin resistant staph aureus and E coli UTI. SHe has hx dementia.  Recommendations Change vanco to doxycycline for 14 days total Change ceftriaxone to keflex for 7 days total Continue local wound care per wound RN Discussed with family will take 3-4 weeks to heal  I will sign off but please call with questions.  Thank you very much for the consult.  Leonel Ramsay   01/23/2017, 2:19 PM

## 2017-01-24 ENCOUNTER — Ambulatory Visit: Payer: Medicare Other | Admitting: Physician Assistant

## 2017-01-24 DIAGNOSIS — R633 Feeding difficulties: Secondary | ICD-10-CM | POA: Diagnosis not present

## 2017-01-24 DIAGNOSIS — L409 Psoriasis, unspecified: Secondary | ICD-10-CM | POA: Diagnosis not present

## 2017-01-24 DIAGNOSIS — Z7401 Bed confinement status: Secondary | ICD-10-CM | POA: Diagnosis not present

## 2017-01-24 DIAGNOSIS — E569 Vitamin deficiency, unspecified: Secondary | ICD-10-CM | POA: Diagnosis not present

## 2017-01-24 DIAGNOSIS — F0390 Unspecified dementia without behavioral disturbance: Secondary | ICD-10-CM | POA: Diagnosis not present

## 2017-01-24 DIAGNOSIS — Z5189 Encounter for other specified aftercare: Secondary | ICD-10-CM | POA: Diagnosis not present

## 2017-01-24 DIAGNOSIS — L0390 Cellulitis, unspecified: Secondary | ICD-10-CM | POA: Diagnosis not present

## 2017-01-24 DIAGNOSIS — N390 Urinary tract infection, site not specified: Secondary | ICD-10-CM | POA: Diagnosis not present

## 2017-01-24 DIAGNOSIS — E785 Hyperlipidemia, unspecified: Secondary | ICD-10-CM | POA: Diagnosis not present

## 2017-01-24 DIAGNOSIS — G309 Alzheimer's disease, unspecified: Secondary | ICD-10-CM | POA: Diagnosis not present

## 2017-01-24 DIAGNOSIS — G4709 Other insomnia: Secondary | ICD-10-CM | POA: Diagnosis not present

## 2017-01-24 DIAGNOSIS — F419 Anxiety disorder, unspecified: Secondary | ICD-10-CM | POA: Diagnosis not present

## 2017-01-24 DIAGNOSIS — K649 Unspecified hemorrhoids: Secondary | ICD-10-CM | POA: Diagnosis not present

## 2017-01-24 DIAGNOSIS — Z23 Encounter for immunization: Secondary | ICD-10-CM | POA: Diagnosis not present

## 2017-01-24 DIAGNOSIS — R1312 Dysphagia, oropharyngeal phase: Secondary | ICD-10-CM | POA: Diagnosis not present

## 2017-01-24 DIAGNOSIS — L03114 Cellulitis of left upper limb: Secondary | ICD-10-CM | POA: Diagnosis not present

## 2017-01-24 DIAGNOSIS — F3289 Other specified depressive episodes: Secondary | ICD-10-CM | POA: Diagnosis not present

## 2017-01-24 DIAGNOSIS — M6281 Muscle weakness (generalized): Secondary | ICD-10-CM | POA: Diagnosis not present

## 2017-01-24 DIAGNOSIS — R4182 Altered mental status, unspecified: Secondary | ICD-10-CM | POA: Diagnosis not present

## 2017-01-24 DIAGNOSIS — R278 Other lack of coordination: Secondary | ICD-10-CM | POA: Diagnosis not present

## 2017-01-24 DIAGNOSIS — I10 Essential (primary) hypertension: Secondary | ICD-10-CM | POA: Diagnosis not present

## 2017-01-24 DIAGNOSIS — F329 Major depressive disorder, single episode, unspecified: Secondary | ICD-10-CM | POA: Diagnosis not present

## 2017-01-24 DIAGNOSIS — F0151 Vascular dementia with behavioral disturbance: Secondary | ICD-10-CM | POA: Diagnosis not present

## 2017-01-24 DIAGNOSIS — E039 Hypothyroidism, unspecified: Secondary | ICD-10-CM | POA: Diagnosis not present

## 2017-01-24 DIAGNOSIS — R4181 Age-related cognitive decline: Secondary | ICD-10-CM | POA: Diagnosis not present

## 2017-01-24 LAB — BASIC METABOLIC PANEL
ANION GAP: 5 (ref 5–15)
BUN: 16 mg/dL (ref 6–20)
CALCIUM: 7.7 mg/dL — AB (ref 8.9–10.3)
CO2: 29 mmol/L (ref 22–32)
Chloride: 108 mmol/L (ref 101–111)
Creatinine, Ser: 0.75 mg/dL (ref 0.44–1.00)
Glucose, Bld: 86 mg/dL (ref 65–99)
POTASSIUM: 3.2 mmol/L — AB (ref 3.5–5.1)
Sodium: 142 mmol/L (ref 135–145)

## 2017-01-24 LAB — CBC WITH DIFFERENTIAL/PLATELET
Basophils Absolute: 0.1 10*3/uL (ref 0–0.1)
Basophils Relative: 1 %
Eosinophils Absolute: 0.3 10*3/uL (ref 0–0.7)
Eosinophils Relative: 5 %
HCT: 32.8 % — ABNORMAL LOW (ref 35.0–47.0)
Hemoglobin: 11.1 g/dL — ABNORMAL LOW (ref 12.0–16.0)
Lymphocytes Relative: 12 %
Lymphs Abs: 0.8 10*3/uL — ABNORMAL LOW (ref 1.0–3.6)
MCH: 29.6 pg (ref 26.0–34.0)
MCHC: 33.9 g/dL (ref 32.0–36.0)
MCV: 87.4 fL (ref 80.0–100.0)
Monocytes Absolute: 0.6 10*3/uL (ref 0.2–0.9)
Monocytes Relative: 10 %
Neutro Abs: 4.6 10*3/uL (ref 1.4–6.5)
Neutrophils Relative %: 72 %
Platelets: 388 10*3/uL (ref 150–440)
RBC: 3.75 MIL/uL — ABNORMAL LOW (ref 3.80–5.20)
RDW: 19.3 % — ABNORMAL HIGH (ref 11.5–14.5)
WBC: 6.4 10*3/uL (ref 3.6–11.0)

## 2017-01-24 MED ORDER — HYDROCORTISONE 2.5 % RE CREA: TOPICAL_CREAM | Freq: Four times a day (QID) | RECTAL | 0 refills | Status: AC

## 2017-01-24 MED ORDER — ENSURE ENLIVE PO LIQD
237.0000 mL | Freq: Three times a day (TID) | ORAL | 12 refills | Status: AC
Start: 2017-01-24 — End: ?

## 2017-01-24 MED ORDER — RISAQUAD PO CAPS: 2.0000 | ORAL_CAPSULE | Freq: Three times a day (TID) | ORAL | 0 refills | Status: AC

## 2017-01-24 MED ORDER — OCUVITE-LUTEIN PO CAPS
1.0000 | ORAL_CAPSULE | Freq: Every day | ORAL | 0 refills | Status: AC
Start: 2017-01-24 — End: ?

## 2017-01-24 MED ORDER — MUPIROCIN 2 % EX OINT: TOPICAL_OINTMENT | Freq: Every day | CUTANEOUS | 0 refills | Status: AC

## 2017-01-24 MED ORDER — DOXYCYCLINE HYCLATE 100 MG PO TABS: 100.0000 mg | ORAL_TABLET | Freq: Two times a day (BID) | ORAL | 0 refills | Status: AC

## 2017-01-24 MED ORDER — CEPHALEXIN 500 MG PO CAPS: 500.0000 mg | ORAL_CAPSULE | Freq: Two times a day (BID) | ORAL | 0 refills | Status: AC

## 2017-01-24 MED ORDER — CEPHALEXIN 500 MG PO CAPS: 500.0000 mg | ORAL_CAPSULE | Freq: Two times a day (BID) | ORAL | Status: DC

## 2017-01-24 MED ORDER — FUROSEMIDE 40 MG PO TABS: 20.0000 mg | ORAL_TABLET | Freq: Every day | ORAL | Status: AC

## 2017-01-24 MED ORDER — EYE WASH OPHTH SOLN: 2.0000 [drp] | OPHTHALMIC | 0 refills | Status: AC | PRN

## 2017-01-24 MED ORDER — DOXYCYCLINE HYCLATE 100 MG PO TABS: 100.0000 mg | ORAL_TABLET | Freq: Two times a day (BID) | ORAL | Status: DC

## 2017-01-24 MED ORDER — POTASSIUM CHLORIDE CRYS ER 20 MEQ PO TBCR
40.0000 meq | EXTENDED_RELEASE_TABLET | Freq: Once | ORAL | Status: AC
  Administered 2017-01-24: 40 meq via ORAL
  Filled 2017-01-24: qty 2

## 2017-01-24 NOTE — Progress Notes (Signed)
Pt positive for MRSA in her wound. Started ISOprecautions.

## 2017-01-24 NOTE — Progress Notes (Signed)
Tammy Henry  Alert to person was discharge to [facility] via EMS. Explain discharge to daughter she was at bedside. VS within defined limits Pt tolerating diet well. No complaints of pain or nausea. IV removed intact, prescriptions given. Report given to Johny Blamer RN from [employer].   Allergies as of 01/24/2017      Reactions   Lamisil [terbinafine] Nausea And Vomiting   Sulfur Nausea And Vomiting      Medication List    TAKE these medications   acetaminophen 325 MG tablet Commonly known as:  TYLENOL Take 2 tablets (650 mg total) by mouth every 6 (six) hours as needed for mild pain (or Fever >/= 101).   acidophilus Caps capsule Take 2 capsules by mouth 3 (three) times daily.   amLODipine 5 MG tablet Commonly known as:  NORVASC Take 5 mg daily by mouth.   atorvastatin 20 MG tablet Commonly known as:  LIPITOR Take 20 mg at bedtime by mouth.   CALCIUM 600+D3 600-800 MG-UNIT Tabs Generic drug:  Calcium Carb-Cholecalciferol Take 1 tablet daily by mouth.   cephALEXin 500 MG capsule Commonly known as:  KEFLEX Take 1 capsule (500 mg total) by mouth every 12 (twelve) hours.   cholecalciferol 1000 units tablet Commonly known as:  VITAMIN D Take 1,000 Units daily by mouth.   citalopram 10 MG tablet Commonly known as:  CELEXA Take 10 mg every morning by mouth.   divalproex 250 MG 24 hr tablet Commonly known as:  DEPAKOTE ER Take 500 mg daily by mouth.   doxycycline 100 MG tablet Commonly known as:  VIBRA-TABS Take 1 tablet (100 mg total) by mouth every 12 (twelve) hours.   eye wash Soln Place 2 drops into both eyes as needed for dry eyes.   feeding supplement (ENSURE ENLIVE) Liqd Take 237 mLs by mouth 3 (three) times daily between meals.   furosemide 40 MG tablet Commonly known as:  LASIX Take 0.5 tablets (20 mg total) by mouth daily. If needed for leg swelling What changed:  how much to take   hydrocortisone 2.5 % rectal cream Commonly known as:   ANUSOL-HC Place rectally 4 (four) times daily.   hydrOXYzine 25 MG tablet Commonly known as:  ATARAX/VISTARIL Take 25 mg 2 (two) times daily by mouth.   levothyroxine 50 MCG tablet Commonly known as:  SYNTHROID, LEVOTHROID Take 50 mcg daily before breakfast by mouth.   multivitamin-lutein Caps capsule Take 1 capsule by mouth daily.   mupirocin ointment 2 % Commonly known as:  BACTROBAN Apply topically daily.   temazepam 15 MG capsule Commonly known as:  RESTORIL Take 15 mg at bedtime as needed by mouth for sleep.   triamcinolone cream 0.1 % Commonly known as:  KENALOG MIX WITH EUCERIN AT HOME AND APPLY TO RASH twice a day as directed BY PRESCRIBER            Durable Medical Equipment  (From admission, onward)        Start     Ordered   01/23/17 1423  For home use only DME Walker rolling  Once    Comments:  5 inch wheels  Question:  Patient needs a walker to treat with the following condition  Answer:  Weakness   01/23/17 1422      Vitals:   01/24/17 0805 01/24/17 0918  BP:  (!) 116/49  Pulse: 75 71  Resp: 14 16  Temp: 98.5 F (36.9 C) 99 F (37.2 C)  SpO2: 93%  92%    [name]

## 2017-01-24 NOTE — Discharge Summary (Signed)
[facility] NAME: Tammy Henry    MR#:  [id number]  DATE OF BIRTH:  1929/06/27  DATE OF ADMISSION:  01/18/2017 ADMITTING PHYSICIAN: Tammy Grayer, MD  DATE OF DISCHARGE:  01/24/17  PRIMARY CARE PHYSICIAN: Tammy Maltese, MD    ADMISSION DIAGNOSIS:  Cellulitis of left upper extremity [L03.114]  DISCHARGE DIAGNOSIS:  Active Problems:   Cellulitis  Ecoli UTI SECONDARY DIAGNOSIS:   Past Medical History:  Diagnosis Date  . Acute kidney failure, unspecified ([place])   . Anxiety state, unspecified   . Cachexia ([place])   . Dehydration   . Depressive disorder, not elsewhere classified   . Early satiety   . Edema   . Essential hypertension, benign   . Functional diarrhea   . Iron deficiency anemia, unspecified   . Nonspecific abnormal electrocardiogram (ECG) (EKG)   . Osteoporosis, unspecified   . Other and unspecified disc disorder of cervical region   . Other frontotemporal dementia   . Other psoriasis   . Other specified disorders of shoulder joint   . Unspecified hypothyroidism   . Unspecified pruritic disorder   . Unspecified venous (peripheral) insufficiency   . Urinary tract infection, site not specified     HOSPITAL COURSE:  HPI Tammy Henry  is a 83 y.o. female who recently had a skin tear on Sunday.  Family states that a little glue was used to keep it together.  Area started getting more inflamed and red.  Patient had a low-grade temperature.  Family noticed she has been drinking less recently.  They also noticed a foul smell in her urine.  Patient is a poor historian secondary to dementia.  Hospitalist services contacted for cellulitis of the left arm.     1. Cellulitis of the left forearm. Started off as a skin tear. Now with surrounding cellulitis. Continued Vancomycin, wound care nurse for dressing change M/W/F.  Probiotics; change antibiotics to doxycycline for a total of 15 days Febrile last  night Wound culture with MRSA sensitive to vancomycin ,  discharged with doxycycline Appreciate infectious disease recommendations 2.  Acute cystitis: Urine culture with E. coli sensitive to Rocephin , discharged with a 7 days of p.o. Keflex 3. Hypothyroidism unspecified continue levothyroxine. 4. Essential hypertension on Norvasc 5. Hyperlipidemia unspecified on atorvastatin 6. Dehydration: continued IV fluid hydration and held Lasix.  Will resume now 7. External hemorrhoids Anusol cream 8.  Dementia with behavioral disturbance: continue Depakote sprinkle twice daily since the patient having trouble swallowing this pill.  PT is recommending skilled nursing facility, 2 daughters  are agreeable to discharge patient to [facility] today  Dysphagia 1 diet Disposition - snf- to [facility]     DISCHARGE CONDITIONS:   FAIR CONSULTS OBTAINED:  Treatment Team:  Tammy Ramsay, MD   PROCEDURES  None   DRUG ALLERGIES:   Allergies  Allergen Reactions  . Lamisil [Terbinafine] Nausea And Vomiting  . Sulfur Nausea And Vomiting    DISCHARGE MEDICATIONS:   Current Discharge Medication List    START taking these medications   Details  acidophilus (RISAQUAD) CAPS capsule Take 2 capsules by mouth 3 (three) times daily. Qty: 90 capsule, Refills: 0    cephALEXin (KEFLEX) 500 MG capsule Take 1 capsule (500 mg total) by mouth every 12 (twelve) hours. Qty: 14 capsule, Refills: 0    doxycycline (VIBRA-TABS) 100 MG tablet Take 1 tablet (100 mg total) by mouth every 12 (twelve) hours. Qty: 28  tablet, Refills: 0    eye wash (,SODIUM/POTASSIUM/SOD CHLORIDE,) SOLN Place 2 drops into both eyes as needed for dry eyes. Qty: 118 mL, Refills: 0    feeding supplement, ENSURE ENLIVE, (ENSURE ENLIVE) LIQD Take 237 mLs by mouth 3 (three) times daily between meals. Qty: 237 mL, Refills: 12    hydrocortisone (ANUSOL-HC) 2.5 % rectal cream Place rectally 4 (four) times daily. Qty: 30  g, Refills: 0    multivitamin-lutein (OCUVITE-LUTEIN) CAPS capsule Take 1 capsule by mouth daily. Refills: 0    mupirocin ointment (BACTROBAN) 2 % Apply topically daily. Qty: 22 g, Refills: 0      CONTINUE these medications which have CHANGED   Details  furosemide (LASIX) 40 MG tablet Take 0.5 tablets (20 mg total) by mouth daily. If needed for leg swelling Qty: 30 tablet      CONTINUE these medications which have NOT CHANGED   Details  amLODipine (NORVASC) 5 MG tablet Take 5 mg daily by mouth.    atorvastatin (LIPITOR) 20 MG tablet Take 20 mg at bedtime by mouth.    Calcium Carb-Cholecalciferol (CALCIUM 600+D3) 600-800 MG-UNIT TABS Take 1 tablet daily by mouth.    cholecalciferol (VITAMIN D) 1000 units tablet Take 1,000 Units daily by mouth.    citalopram (CELEXA) 10 MG tablet Take 10 mg every morning by mouth.    divalproex (DEPAKOTE ER) 250 MG 24 hr tablet Take 500 mg daily by mouth.    hydrOXYzine (ATARAX/VISTARIL) 25 MG tablet Take 25 mg 2 (two) times daily by mouth.    levothyroxine (SYNTHROID, LEVOTHROID) 50 MCG tablet Take 50 mcg daily before breakfast by mouth.    temazepam (RESTORIL) 15 MG capsule Take 15 mg at bedtime as needed by mouth for sleep.    acetaminophen (TYLENOL) 325 MG tablet Take 2 tablets (650 mg total) by mouth every 6 (six) hours as needed for mild pain (or Fever >/= 101).    triamcinolone cream (KENALOG) 0.1 % MIX WITH EUCERIN AT HOME AND APPLY TO RASH twice a day as directed BY PRESCRIBER Qty: 80 g, Refills: 1   Associated Diagnoses: Chronic eczema         DISCHARGE INSTRUCTIONS:  Follow-up with primary care physician at the facility in 5-7 days Continue wound care on Monday, Wednesday and Friday Continue antibiotics as recommended Dysphagia type I diet Continue palliative care service at [facility]    DIET:  Dysphagia 1 diet with Ensure and live supplements and Magic cup2  DISCHARGE CONDITION:  Fair  ACTIVITY:  Activity as  tolerated  OXYGEN:  Home Oxygen: No.   Oxygen Delivery: room air  DISCHARGE LOCATION:  nursing home   If you experience worsening of your admission symptoms, develop shortness of breath, life threatening emergency, suicidal or homicidal thoughts you must seek medical attention immediately by calling 911 or calling your MD immediately  if symptoms less severe.  You Must read complete instructions/literature along with all the possible adverse reactions/side effects for all the Medicines you take and that have been prescribed to you. Take any new Medicines after you have completely understood and accpet all the possible adverse reactions/side effects.   Please note  You were cared for by a hospitalist during your hospital stay. If you have any questions about your discharge medications or the care you received while you were in the hospital after you are discharged, you can call the unit and asked to speak with the hospitalist on call if the hospitalist that took care of  you is not available. Once you are discharged, your primary care physician will handle any further medical issues. Please note that NO REFILLS for any discharge medications will be authorized once you are discharged, as it is imperative that you return to your primary care physician (or establish a relationship with a primary care physician if you do not have one) for your aftercare needs so that they can reassess your need for medications and monitor your lab values.     Today  Chief Complaint  Patient presents with  . Cellulitis     ROS:  Patient is demented and poor historian  VITAL SIGNS:  Blood pressure (!) 124/54, pulse 75, temperature 98.5 F (36.9 C), temperature source Axillary, resp. rate 14, height 5\' 4"  (1.626 m), weight 45.1 kg (99 lb 8 oz), SpO2 93 %.  I/O:    Intake/Output Summary (Last 24 hours) at 01/24/2017 0816 Last data filed at 01/24/2017 0734 Gross per 24 hour  Intake 1707 ml  Output -   Net 1707 ml    PHYSICAL EXAMINATION:   GENERAL: lying in the bed with no acute distress. emasciated EYES: Pupils equal, round, reactive to light and accommodation. No scleral icterus.  HEENT: Head atraumatic, normocephalic. Oropharynx and nasopharynx clear.  NECK: Supple, no jugular venous distention. No thyroid enlargement, no tenderness.  LUNGS:Nml breath sounds bilaterally, no wheezing, rales,rhonchi ;  No use of accessory muscles of respiration.  CARDIOVASCULAR: S1, S2 normal. No murmurs, rubs, or gallops.  ABDOMEN: Soft, nontender, nondistended. Bowel sounds present. No organomegaly or mass.  EXTREMITIES: No pedal edema, cyanosis, or clubbing.  NEUROLOGIC:demented, alert PSYCHIATRIC: The patient is alert and oriented x 1.  SKIN: LUE : skin with erythematous wound with some discharge No obvious rash, lesion     DATA REVIEW:   CBC Recent Labs  Lab 01/24/17 0449  WBC 6.4  HGB 11.1*  HCT 32.8*  PLT 388    Chemistries  Recent Labs  Lab 01/18/17 1703  01/24/17 0449  NA 142   < > 142  K 4.2   < > 3.2*  CL 105   < > 108  CO2 28   < > 29  GLUCOSE 98   < > 86  BUN 22*   < > 16  CREATININE 1.03*   < > 0.75  CALCIUM 8.8*   < > 7.7*  AST 15  --   --   ALT 11*  --   --   ALKPHOS 76  --   --   BILITOT 0.5  --   --    < > = values in this interval not displayed.    Cardiac Enzymes No results for input(s): TROPONINI in the last 168 hours.  Microbiology Results  Results for orders placed or performed during the hospital encounter of 01/18/17  Aerobic/Anaerobic Culture (surgical/deep wound)     Status: None (Preliminary result)   Collection Time: 01/20/17 11:38 AM  Result Value Ref Range Status   Specimen Description WOUND  Final   Special Requests FOREARM  Final   Gram Stain   Final    FEW WBC PRESENT, PREDOMINANTLY PMN RARE GRAM POSITIVE COCCI IN CLUSTERS RARE GRAM NEGATIVE RODS Performed at [facility], [place] [address], [place],  [address]     Culture   Final    MODERATE METHICILLIN RESISTANT STAPHYLOCOCCUS AUREUS NO ANAEROBES ISOLATED; CULTURE IN PROGRESS FOR 5 DAYS    Report Status PENDING  Incomplete   Organism ID, Bacteria  METHICILLIN RESISTANT STAPHYLOCOCCUS AUREUS  Final      Susceptibility   Methicillin resistant staphylococcus aureus - MIC*    CIPROFLOXACIN >=8 RESISTANT Resistant     ERYTHROMYCIN >=8 RESISTANT Resistant     GENTAMICIN <=0.5 SENSITIVE Sensitive     OXACILLIN >=4 RESISTANT Resistant     TETRACYCLINE <=1 SENSITIVE Sensitive     VANCOMYCIN 1 SENSITIVE Sensitive     TRIMETH/SULFA <=10 SENSITIVE Sensitive     CLINDAMYCIN <=0.25 SENSITIVE Sensitive     RIFAMPIN <=0.5 SENSITIVE Sensitive     Inducible Clindamycin NEGATIVE Sensitive     * MODERATE METHICILLIN RESISTANT STAPHYLOCOCCUS AUREUS  Urine Culture     Status: Abnormal   Collection Time: 01/21/17 11:49 AM  Result Value Ref Range Status   Specimen Description URINE, RANDOM  Final   Special Requests Normal  Final   Culture >=100,000 COLONIES/mL ESCHERICHIA COLI (A)  Final   Report Status 01/23/2017 FINAL  Final   Organism ID, Bacteria ESCHERICHIA COLI (A)  Final      Susceptibility   Escherichia coli - MIC*    AMPICILLIN >=32 RESISTANT Resistant     CEFAZOLIN <=4 SENSITIVE Sensitive     CEFTRIAXONE <=1 SENSITIVE Sensitive     CIPROFLOXACIN >=4 RESISTANT Resistant     GENTAMICIN >=16 RESISTANT Resistant     IMIPENEM <=0.25 SENSITIVE Sensitive     NITROFURANTOIN <=16 SENSITIVE Sensitive     TRIMETH/SULFA >=320 RESISTANT Resistant     AMPICILLIN/SULBACTAM >=32 RESISTANT Resistant     PIP/TAZO <=4 SENSITIVE Sensitive     Extended ESBL NEGATIVE Sensitive     * >=100,000 COLONIES/mL ESCHERICHIA COLI  C difficile quick scan w PCR reflex     Status: None   Collection Time: 01/23/17  3:56 PM  Result Value Ref Range Status   C Diff antigen NEGATIVE NEGATIVE Final   C Diff toxin NEGATIVE NEGATIVE Final   C Diff interpretation No C. difficile  detected.  Final    RADIOLOGY:  No results found.  EKG:   Orders placed or performed during the hospital encounter of 10/21/15  . EKG 12-Lead  . EKG 12-Lead  . ED EKG  . ED EKG  . EKG      Management plans discussed with the patient, has 2 daughters and caregivers at bedside and they are in agreement.  CODE STATUS:     Code Status Orders  (From admission, onward)        Start     Ordered   01/19/17 1123  Do not attempt resuscitation (DNR)  Continuous    Question Answer Comment  In the event of cardiac or respiratory ARREST Do not call a "code blue"   In the event of cardiac or respiratory ARREST Do not perform Intubation, CPR, defibrillation or ACLS   In the event of cardiac or respiratory ARREST Use medication by any route, position, wound care, and other measures to relive pain and suffering. Howser use oxygen, suction and manual treatment of airway obstruction as needed for comfort.   Comments Per daughter Otila Kluver who is POA      01/19/17 1123    Code Status History    Date Active Date Inactive Code Status Order ID Comments User Context   01/18/2017 18:35 01/19/2017 11:23 Full Code [id number]  Tammy Grayer, MD ED   03/11/2015 01:16 03/16/2015 20:25 Full Code [id number]  Sherri Rad, MD Inpatient   03/06/2015 22:37 03/09/2015 20:49 Full Code [id number]  Florene Glen, MD ED  Advance Directive Documentation     Most Recent Value  Type of Advance Directive  Healthcare Power of Attorney, Living will  Pre-existing out of facility DNR order (yellow form or pink MOST form)  No data  "MOST" Form in Place?  No data      TOTAL TIME TAKING CARE OF THIS PATIENT: 45  minutes.   Note: This dictation was prepared with Dragon dictation along with smaller phrase technology. Any transcriptional errors that result from this process are unintentional.   @[facility] @  on 01/24/2017 at 8:16 AM  Between 7am to 6pm - Pager - [phone]  After 6pm go to [email or web address] - password [email or web address]  [facility]  Office  [phone]  CC: Primary care physician; Tammy Maltese, MD

## 2017-01-24 NOTE — Progress Notes (Signed)
Call report to [facility] spoke with Johny Blamer RN.

## 2017-01-24 NOTE — Progress Notes (Addendum)
Spoke with social worker [place], she states she spoke with Otila Kluver, and the plan will be for Tammy Henry to proceed to rehab following discharge and transition to hospice at a later date. Recommend palliative to follow outpatient.   No charge.

## 2017-01-24 NOTE — Discharge Instructions (Signed)
Follow-up with primary care physician at the facility in 5-7 days Continue wound care on Monday, Wednesday and Friday Continue antibiotics as recommended Dysphagia type I diet Continue palliative care service at [facility]

## 2017-01-27 LAB — AEROBIC/ANAEROBIC CULTURE W GRAM STAIN (SURGICAL/DEEP WOUND)

## 2017-01-27 LAB — AEROBIC/ANAEROBIC CULTURE (SURGICAL/DEEP WOUND)

## 2017-01-28 DIAGNOSIS — E785 Hyperlipidemia, unspecified: Secondary | ICD-10-CM | POA: Diagnosis not present

## 2017-01-28 DIAGNOSIS — L03114 Cellulitis of left upper limb: Secondary | ICD-10-CM | POA: Diagnosis not present

## 2017-01-28 DIAGNOSIS — N390 Urinary tract infection, site not specified: Secondary | ICD-10-CM | POA: Diagnosis not present

## 2017-01-28 DIAGNOSIS — E039 Hypothyroidism, unspecified: Secondary | ICD-10-CM | POA: Diagnosis not present

## 2017-01-28 DIAGNOSIS — M6281 Muscle weakness (generalized): Secondary | ICD-10-CM | POA: Diagnosis not present

## 2017-01-28 DIAGNOSIS — F329 Major depressive disorder, single episode, unspecified: Secondary | ICD-10-CM | POA: Diagnosis not present

## 2017-01-28 DIAGNOSIS — F419 Anxiety disorder, unspecified: Secondary | ICD-10-CM | POA: Diagnosis not present

## 2017-01-28 DIAGNOSIS — F0390 Unspecified dementia without behavioral disturbance: Secondary | ICD-10-CM | POA: Diagnosis not present

## 2017-01-28 DIAGNOSIS — I10 Essential (primary) hypertension: Secondary | ICD-10-CM | POA: Diagnosis not present

## 2017-02-02 ENCOUNTER — Other Ambulatory Visit: Payer: Self-pay | Admitting: *Deleted

## 2017-02-02 DIAGNOSIS — L03114 Cellulitis of left upper limb: Secondary | ICD-10-CM | POA: Diagnosis not present

## 2017-02-02 DIAGNOSIS — E039 Hypothyroidism, unspecified: Secondary | ICD-10-CM | POA: Diagnosis not present

## 2017-02-02 DIAGNOSIS — I10 Essential (primary) hypertension: Secondary | ICD-10-CM | POA: Diagnosis not present

## 2017-02-02 DIAGNOSIS — F0390 Unspecified dementia without behavioral disturbance: Secondary | ICD-10-CM | POA: Diagnosis not present

## 2017-02-02 DIAGNOSIS — F329 Major depressive disorder, single episode, unspecified: Secondary | ICD-10-CM | POA: Diagnosis not present

## 2017-02-02 DIAGNOSIS — R4181 Age-related cognitive decline: Secondary | ICD-10-CM | POA: Diagnosis not present

## 2017-02-02 DIAGNOSIS — E785 Hyperlipidemia, unspecified: Secondary | ICD-10-CM | POA: Diagnosis not present

## 2017-02-02 DIAGNOSIS — F419 Anxiety disorder, unspecified: Secondary | ICD-10-CM | POA: Diagnosis not present

## 2017-02-02 DIAGNOSIS — N390 Urinary tract infection, site not specified: Secondary | ICD-10-CM | POA: Diagnosis not present

## 2017-02-02 NOTE — Patient Outreach (Signed)
[facility]) Care Management  02/02/2017  Sabrie Moritz [place] 02-28-1930 [id number]   Per Donnalee Curry, SW at facility, patient will discharge next week to memory care unit at [facility].   Plan to sign off as no [facility] care management needs.   Royetta Crochet. Laymond Purser, RN, BSN, [place] [phone]) Business Cell  [phone]) Toll Free Office

## 2017-02-21 DIAGNOSIS — E782 Mixed hyperlipidemia: Secondary | ICD-10-CM | POA: Diagnosis not present

## 2017-02-21 DIAGNOSIS — N3090 Cystitis, unspecified without hematuria: Secondary | ICD-10-CM | POA: Diagnosis not present

## 2017-02-21 DIAGNOSIS — I10 Essential (primary) hypertension: Secondary | ICD-10-CM | POA: Diagnosis not present

## 2017-02-21 DIAGNOSIS — N390 Urinary tract infection, site not specified: Secondary | ICD-10-CM | POA: Diagnosis not present

## 2017-02-21 DIAGNOSIS — F0281 Dementia in other diseases classified elsewhere with behavioral disturbance: Secondary | ICD-10-CM | POA: Diagnosis not present

## 2017-02-21 DIAGNOSIS — E039 Hypothyroidism, unspecified: Secondary | ICD-10-CM | POA: Diagnosis not present

## 2017-04-13 ENCOUNTER — Emergency Department: Payer: Medicare Other

## 2017-04-13 ENCOUNTER — Emergency Department
Admission: EM | Admit: 2017-04-13 | Discharge: 2017-04-14 | Disposition: A | Discharge status: Home / Self Care | Payer: Medicare Other | Attending: Emergency Medicine | Admitting: Emergency Medicine

## 2017-04-13 ENCOUNTER — Other Ambulatory Visit: Payer: Self-pay

## 2017-04-13 DIAGNOSIS — Y9389 Activity, other specified: Secondary | ICD-10-CM | POA: Insufficient documentation

## 2017-04-13 DIAGNOSIS — E039 Hypothyroidism, unspecified: Secondary | ICD-10-CM | POA: Insufficient documentation

## 2017-04-13 DIAGNOSIS — S0083XA Contusion of other part of head, initial encounter: Secondary | ICD-10-CM | POA: Diagnosis not present

## 2017-04-13 DIAGNOSIS — S0081XA Abrasion of other part of head, initial encounter: Secondary | ICD-10-CM | POA: Insufficient documentation

## 2017-04-13 DIAGNOSIS — I129 Hypertensive chronic kidney disease with stage 1 through stage 4 chronic kidney disease, or unspecified chronic kidney disease: Secondary | ICD-10-CM | POA: Diagnosis not present

## 2017-04-13 DIAGNOSIS — Y92129 Unspecified place in nursing home as the place of occurrence of the external cause: Secondary | ICD-10-CM | POA: Insufficient documentation

## 2017-04-13 DIAGNOSIS — F329 Major depressive disorder, single episode, unspecified: Secondary | ICD-10-CM | POA: Insufficient documentation

## 2017-04-13 DIAGNOSIS — N183 Chronic kidney disease, stage 3 (moderate): Secondary | ICD-10-CM | POA: Diagnosis not present

## 2017-04-13 DIAGNOSIS — F419 Anxiety disorder, unspecified: Secondary | ICD-10-CM | POA: Insufficient documentation

## 2017-04-13 DIAGNOSIS — Z79899 Other long term (current) drug therapy: Secondary | ICD-10-CM | POA: Diagnosis not present

## 2017-04-13 DIAGNOSIS — F0390 Unspecified dementia without behavioral disturbance: Secondary | ICD-10-CM | POA: Insufficient documentation

## 2017-04-13 DIAGNOSIS — T148XXA Other injury of unspecified body region, initial encounter: Secondary | ICD-10-CM

## 2017-04-13 DIAGNOSIS — S0990XA Unspecified injury of head, initial encounter: Secondary | ICD-10-CM | POA: Diagnosis present

## 2017-04-13 DIAGNOSIS — W0110XA Fall on same level from slipping, tripping and stumbling with subsequent striking against unspecified object, initial encounter: Secondary | ICD-10-CM | POA: Insufficient documentation

## 2017-04-13 DIAGNOSIS — Y998 Other external cause status: Secondary | ICD-10-CM | POA: Diagnosis not present

## 2017-04-13 DIAGNOSIS — W19XXXA Unspecified fall, initial encounter: Secondary | ICD-10-CM

## 2017-04-13 NOTE — ED Triage Notes (Signed)
Pt with dementia, unwitnessed fall at [place], found on floor. Pt with laceration with controlled bleeding to left forehead. Pt complains of forehead pain. No other obvious injury noted.

## 2017-04-13 NOTE — ED Provider Notes (Signed)
[facility] Emergency Department Provider Note   ____________________________________________   First MD Initiated Contact with Patient 04/13/17 2345     (approximate)  I have reviewed the triage vital signs and the nursing notes.   HISTORY  Chief Complaint Fall  Level V caveat: Patient with dementia   HPI Tammy Henry is a 84 y.o. female brought to the ED [facility] the [facility] [place] [facility] with a chief complaint of unwitnessed fall.  Patient was found on the floor approximately 10 PM with laceration to her left forehead.Patient is not on anticoagulants.  Rest of history unobtainable secondary to patient's dementia.  Patient complains only of forehead pain.    Past Medical History:  Diagnosis Date  . Acute kidney failure, unspecified ([place])   . Anxiety state, unspecified   . Cachexia ([place])   . Dehydration   . Depressive disorder, not elsewhere classified   . Early satiety   . Edema   . Essential hypertension, benign   . Functional diarrhea   . Iron deficiency anemia, unspecified   . Nonspecific abnormal electrocardiogram (ECG) (EKG)   . Osteoporosis, unspecified   . Other and unspecified disc disorder of cervical region   . Other frontotemporal dementia   . Other psoriasis   . Other specified disorders of shoulder joint   . Unspecified hypothyroidism   . Unspecified pruritic disorder   . Unspecified venous (peripheral) insufficiency   . Urinary tract infection, site not specified     Patient Active Problem List   Diagnosis Date Noted  . Cellulitis 01/18/2017  . Intussusception intestine ([place])   . Hemorrhoid   . Intussusception of cecum ([place]) 03/11/2015  . Intussusception ([place]) 03/06/2015  . Protein-calorie malnutrition ([place]) 02/24/2015  . Cachexia ([place]) 12/28/2014  . Chronic eczema 12/28/2014  . Pain in shoulder 12/28/2014  . Chronic kidney disease (CKD), stage III (moderate) ([place]) 12/28/2014  . Major  depressive disorder 12/28/2014  . Dementia of frontal lobe type 12/28/2014  . History of blood transfusion 12/28/2014  . Anemia, iron deficiency 12/28/2014  . Insomnia 12/28/2014  . Itch of skin 12/28/2014  . Iron deficiency anemia 12/28/2014  . 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 Adult hypothyroidism 05/10/2009  . Varicose vein 05/10/2009  . Chronic venous insufficiency 02/06/2007  . Hypertension goal BP (blood pressure) < 140/90 07/11/2006  . OP (osteoporosis) 07/11/2006    Past Surgical History:  Procedure Laterality Date  . CATARACT EXTRACTION, BILATERAL      Prior to Admission medications   Medication Sig Start Date End Date Taking? Authorizing Provider  acetaminophen (TYLENOL) 325 MG tablet Take 2 tablets (650 mg total) by mouth every 6 (six) hours as needed for mild pain (or Fever >/= 101). 03/16/15   Colleen Can, MD  acidophilus (RISAQUAD) CAPS capsule Take 2 capsules by mouth 3 (three) times daily. 01/24/17   Gouru, Illene Silver, MD  amLODipine (NORVASC) 5 MG tablet Take 5 mg daily by mouth.    [provider]  atorvastatin (LIPITOR) 20 MG tablet Take 20 mg at bedtime by mouth.    [provider]  Calcium Carb-Cholecalciferol (CALCIUM 600+D3) 600-800 MG-UNIT TABS Take 1 tablet daily by mouth.    [provider]  cephALEXin (KEFLEX) 500 MG capsule Take 1 capsule (500 mg total) by mouth every 12 (twelve) hours. 01/24/17   Nicholes Mango, MD  cholecalciferol (VITAMIN D) 1000 units tablet Take 1,000 Units daily by mouth.    [provider]  citalopram (  CELEXA) 10 MG tablet Take 10 mg every morning by mouth.    [provider]  divalproex (DEPAKOTE ER) 250 MG 24 hr tablet Take 500 mg daily by mouth.    [provider]  doxycycline (VIBRA-TABS) 100 MG tablet Take 1 tablet (100 mg total) by mouth every 12 (twelve) hours. 01/24/17   Gouru, Illene Silver, MD  eye wash (,SODIUM/POTASSIUM/SOD CHLORIDE,) SOLN Place 2 drops into both eyes as  needed for dry eyes. 01/24/17   Gouru, Illene Silver, MD  feeding supplement, ENSURE ENLIVE, (ENSURE ENLIVE) LIQD Take 237 mLs by mouth 3 (three) times daily between meals. 01/24/17   Nicholes Mango, MD  furosemide (LASIX) 40 MG tablet Take 0.5 tablets (20 mg total) by mouth daily. If needed for leg swelling 01/24/17   Gouru, Illene Silver, MD  hydrocortisone (ANUSOL-HC) 2.5 % rectal cream Place rectally 4 (four) times daily. 01/24/17   Nicholes Mango, MD  hydrOXYzine (ATARAX/VISTARIL) 25 MG tablet Take 25 mg 2 (two) times daily by mouth.    [provider]  levothyroxine (SYNTHROID, LEVOTHROID) 50 MCG tablet Take 50 mcg daily before breakfast by mouth.    [provider]  multivitamin-lutein (OCUVITE-LUTEIN) CAPS capsule Take 1 capsule by mouth daily. 01/24/17   Nicholes Mango, MD  mupirocin ointment (BACTROBAN) 2 % Apply topically daily. 01/24/17   Gouru, Illene Silver, MD  temazepam (RESTORIL) 15 MG capsule Take 15 mg at bedtime as needed by mouth for sleep.    [provider]  triamcinolone cream (KENALOG) 0.1 % MIX WITH EUCERIN AT HOME AND APPLY TO RASH twice a day as directed BY PRESCRIBER 06/30/15   Steele Sizer, MD    Allergies Lamisil [terbinafine] and Sulfur  Family History  Problem Relation Age of Onset  . Lymphoma Mother   . Alzheimer's disease Mother   . Alzheimer's disease Father   . Breast cancer Sister   . Depression Daughter   . Asthma Daughter   . Schizophrenia Daughter   . Thyroid disease Daughter     Social History Social History   Tobacco Use  . Smoking status: Never Smoker  . Smokeless tobacco: Never Used  Substance Use Topics  . Alcohol use: No  . Drug use: No    Review of Systems  Constitutional: No fever/chills. Eyes: No visual changes. ENT: Positive for left forehead laceration.  No sore throat. Cardiovascular: Denies chest pain. Respiratory: Denies shortness of breath. Gastrointestinal: No abdominal pain.  No nausea, no vomiting.  No diarrhea.   No constipation. Genitourinary: Negative for dysuria. Musculoskeletal: Negative for back pain. Skin: Negative for rash. Neurological: Negative for headaches, focal weakness or numbness.   ____________________________________________   PHYSICAL EXAM:  VITAL SIGNS: ED Triage Vitals  Enc Vitals Group     BP --      Pulse Rate 04/13/17 2343 69     Resp 04/13/17 2343 18     Temp 04/13/17 2343 97.8 F (36.6 C)     Temp Source 04/13/17 2343 Oral     SpO2 04/13/17 2343 100 %     Weight 04/13/17 2344 125 lb 10.6 oz (57 kg)     Height --      Head Circumference --      Peak Flow --      Pain Score --      Pain Loc --      Pain Edu? --      Excl. in [place]? --     Constitutional: Alert and oriented. Well appearing and in no  acute distress. Eyes: Conjunctivae are normal. PERRL. EOMI. Head: 2 abrasion type skin tears to left forehead. Nose: No congestion/rhinnorhea. Mouth/Throat: Mucous membranes are moist.  Oropharynx non-erythematous. Neck: No stridor.  No cervical spine tenderness to palpation.  No step-offs or deformities. Cardiovascular: Normal rate, regular rhythm. Grossly normal heart sounds.  Good peripheral circulation. Respiratory: Normal respiratory effort.  No retractions. Lungs CTAB. Gastrointestinal: Soft and nontender. No distention. No abdominal bruits. No CVA tenderness. Musculoskeletal: No lower extremity tenderness nor edema.  No joint effusions. Neurologic: Alert and oriented to person only.  Normal speech and language. No gross focal neurologic deficits are appreciated. MAEx4. Skin:  Skin is warm, dry and intact. No rash noted. Psychiatric: Mood and affect are normal. Speech and behavior are normal.  ____________________________________________   LABS (all labs ordered are listed, but only abnormal results are displayed)  Labs Reviewed - No data to  display ____________________________________________  EKG  None ____________________________________________  RADIOLOGY  ED MD interpretation: No acute traumatic injuries  Official radiology report(s): Dg Chest 1 View  Result Date: 04/14/2017 CLINICAL DATA:  Fall with skin tear EXAM: CHEST 1 VIEW COMPARISON:  03/13/2015, 12/19/2013, CT cervical spine 09/27/2016 FINDINGS: Apical calcifications are again visualized. Slight elevation of right diaphragm. No consolidation or pleural effusion. Stable cardiomediastinal silhouette with mild cardiomegaly. Aortic atherosclerosis. No pneumothorax. IMPRESSION: No active disease. Similar appearance of biapical scarring and calcification Electronically Signed   By: Donavan Foil M.D.   On: 04/14/2017 00:29   Dg Pelvis 1-2 Views  Result Date: 04/14/2017 CLINICAL DATA:  Fall with laceration EXAM: PELVIS - 1-2 VIEW COMPARISON:  CT 03/10/2015 FINDINGS: Bilateral SI joint degenerative change, left greater than right. Bones appear osteopenic. Pubic symphysis and rami are intact. No fracture or dislocation. Mild arthritis of both hips. IMPRESSION: No acute osseous abnormality. Electronically Signed   By: Donavan Foil M.D.   On: 04/14/2017 00:27   Ct Head Wo Contrast  Result Date: 04/14/2017 CLINICAL DATA:  Dementia, unwitnessed fall with laceration to the left forehead EXAM: CT HEAD WITHOUT CONTRAST TECHNIQUE: Contiguous axial images were obtained from the base of the skull through the vertex without intravenous contrast. COMPARISON:  CT brain 09/27/2016 FINDINGS: Brain: No acute territorial infarction, hemorrhage or intracranial mass is visualized. Stable ventricle size. Moderate atrophy. Moderate to marked small vessel ischemic changes of the white matter. Vascular: No hyperdense vessels.  Carotid vascular calcification. Skull: No fracture. Stable scalloping of the inner table of the occipital bone, likely due to prominent arachnoid granulation. Sinuses/Orbits:  Fluid and mucosal thickening in the sphenoid sinuses. Mucosal thickening in the ethmoid sinuses. No acute orbital abnormality Other: Small left forehead soft tissue laceration IMPRESSION: 1. No CT evidence for acute intracranial abnormality 2. Atrophy and small vessel ischemic changes of the white matter. Small left forehead soft tissue laceration Electronically Signed   By: Donavan Foil M.D.   On: 04/14/2017 00:15    ____________________________________________   PROCEDURES  Procedure(s) performed: None  Procedures  Critical Care performed: No  ____________________________________________   INITIAL IMPRESSION / ASSESSMENT AND PLAN / ED COURSE  As part of my medical decision making, I reviewed the following data within the [place] notes reviewed and incorporated, Radiograph reviewed and Notes from prior ED visits.   84 year old female with dementia who presents status post unwitnessed fall with skin tears to her left forehead.  Will obtain CT head, plain film x-rays of chest and pelvis.   Clinical Course as of Apr 14 548  Sat Apr 14, 2017  0135 Updated patient of test results.  Patient ambulated with walker with steady gait.  Skin tear was dressed by nursing staff.  Strict return precautions given.  Patient verbalizes understanding and agrees with plan of care.  [JS]    Clinical Course User Index [JS] Paulette Blanch, MD     ____________________________________________   FINAL CLINICAL IMPRESSION(S) / ED DIAGNOSES  Final diagnoses:  Fall, initial encounter  Contusion of face, initial encounter  Abrasion     ED Discharge Orders    None       Note:  This document was prepared using Dragon voice recognition software and [place] include unintentional dictation errors.    Paulette Blanch, MD 04/14/17 [phone]

## 2017-04-14 ENCOUNTER — Emergency Department: Payer: Medicare Other

## 2017-04-14 ENCOUNTER — Other Ambulatory Visit: Payer: Medicare Other

## 2017-04-14 NOTE — ED Notes (Signed)
Pt updated on care plan. Pt does not verbalize understanding. Pt covered with warm blankets for warmth. Wander guard, call alarm, posey in place, yellow nonskid socks on both feet and yellow fall band on right wrist.

## 2017-04-14 NOTE — ED Notes (Signed)
Pt cleansed of incontinent urine by ann cales, rn.

## 2017-04-14 NOTE — Discharge Instructions (Signed)
Return to the ER for worsening symptoms, persistent vomiting, lethargy or other concerns. °

## 2017-04-14 NOTE — ED Notes (Signed)
Pt resting.

## 2017-04-14 NOTE — ED Notes (Signed)
Attempt to resecure bandage to forehead and touch pt's arm for blood pressure. Pt yelling at staff and attempting to strike staff.

## 2017-04-14 NOTE — ED Notes (Signed)
Pt ambulatory with assist around room and back to bed. Left forehead wound cleansed and dressed.

## 2018-01-01 DIAGNOSIS — E039 Hypothyroidism, unspecified: Secondary | ICD-10-CM

## 2018-01-01 DIAGNOSIS — L309 Dermatitis, unspecified: Secondary | ICD-10-CM

## 2018-01-01 DIAGNOSIS — I10 Essential (primary) hypertension: Secondary | ICD-10-CM

## 2018-01-01 DIAGNOSIS — F39 Unspecified mood [affective] disorder: Secondary | ICD-10-CM

## 2018-01-01 DIAGNOSIS — G3101 Pick's disease: Secondary | ICD-10-CM | POA: Diagnosis not present

## 2018-01-22 DIAGNOSIS — G3109 Other frontotemporal dementia: Secondary | ICD-10-CM

## 2018-01-22 DIAGNOSIS — I10 Essential (primary) hypertension: Secondary | ICD-10-CM

## 2018-01-22 DIAGNOSIS — N183 Chronic kidney disease, stage 3 (moderate): Secondary | ICD-10-CM

## 2018-01-22 DIAGNOSIS — F39 Unspecified mood [affective] disorder: Secondary | ICD-10-CM | POA: Diagnosis not present

## 2018-02-22 DIAGNOSIS — E039 Hypothyroidism, unspecified: Secondary | ICD-10-CM | POA: Diagnosis not present

## 2018-02-22 DIAGNOSIS — G3109 Other frontotemporal dementia: Secondary | ICD-10-CM | POA: Diagnosis not present

## 2018-02-22 DIAGNOSIS — I10 Essential (primary) hypertension: Secondary | ICD-10-CM | POA: Diagnosis not present

## 2018-02-22 DIAGNOSIS — F39 Unspecified mood [affective] disorder: Secondary | ICD-10-CM | POA: Diagnosis not present

## 2018-03-29 DIAGNOSIS — E039 Hypothyroidism, unspecified: Secondary | ICD-10-CM

## 2018-03-29 DIAGNOSIS — G3183 Dementia with Lewy bodies: Secondary | ICD-10-CM

## 2018-03-29 DIAGNOSIS — F39 Unspecified mood [affective] disorder: Secondary | ICD-10-CM

## 2018-03-29 DIAGNOSIS — I10 Essential (primary) hypertension: Secondary | ICD-10-CM | POA: Diagnosis not present

## 2018-06-03 DIAGNOSIS — I10 Essential (primary) hypertension: Secondary | ICD-10-CM | POA: Diagnosis not present

## 2018-06-03 DIAGNOSIS — G3109 Other frontotemporal dementia: Secondary | ICD-10-CM

## 2018-06-03 DIAGNOSIS — N183 Chronic kidney disease, stage 3 (moderate): Secondary | ICD-10-CM

## 2018-06-03 DIAGNOSIS — F39 Unspecified mood [affective] disorder: Secondary | ICD-10-CM

## 2018-07-31 DIAGNOSIS — N183 Chronic kidney disease, stage 3 (moderate): Secondary | ICD-10-CM

## 2018-07-31 DIAGNOSIS — I10 Essential (primary) hypertension: Secondary | ICD-10-CM

## 2018-07-31 DIAGNOSIS — F39 Unspecified mood [affective] disorder: Secondary | ICD-10-CM

## 2018-07-31 DIAGNOSIS — G3101 Pick's disease: Secondary | ICD-10-CM | POA: Diagnosis not present

## 2018-07-31 DIAGNOSIS — E039 Hypothyroidism, unspecified: Secondary | ICD-10-CM | POA: Diagnosis not present

## 2018-09-19 DIAGNOSIS — I10 Essential (primary) hypertension: Secondary | ICD-10-CM

## 2018-09-19 DIAGNOSIS — G3101 Pick's disease: Secondary | ICD-10-CM

## 2018-09-19 DIAGNOSIS — F39 Unspecified mood [affective] disorder: Secondary | ICD-10-CM

## 2018-09-19 DIAGNOSIS — N183 Chronic kidney disease, stage 3 (moderate): Secondary | ICD-10-CM

## 2018-11-12 IMAGING — CT CT CERVICAL SPINE W/O CM
4 of 6 series · 14 of 33 positions shown, 15 images · non-contrast
Comparison: 10/21/2015

CLINICAL DATA: Fell backwards, striking the back of her head while
ambulating without her walker tonight.

EXAM:
CT HEAD WITHOUT CONTRAST
CT CERVICAL SPINE WITHOUT CONTRAST
TECHNIQUE: Multidetector CT imaging of the head and cervical spine was
performed following the standard protocol without intravenous
contrast. Multiplanar CT image reconstructions of the cervical spine
were also generated.

[Series 4: coronal soft tissue · coronal · 0.28mm/px · 3 of 64 slices shown]
[im 13/64  bone]
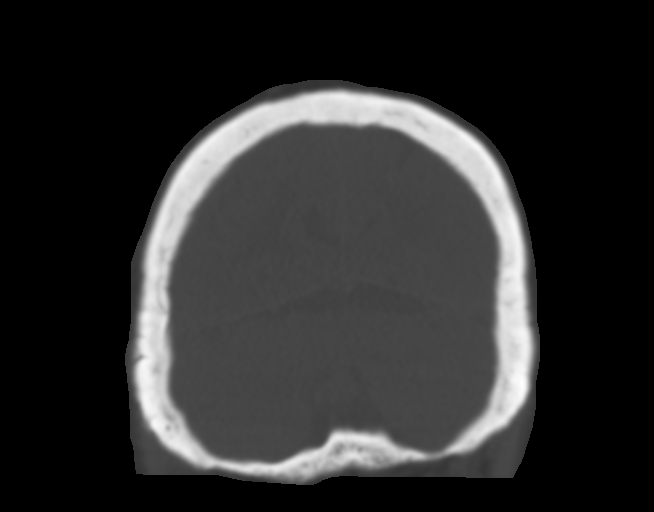
[im 26/64  bone]
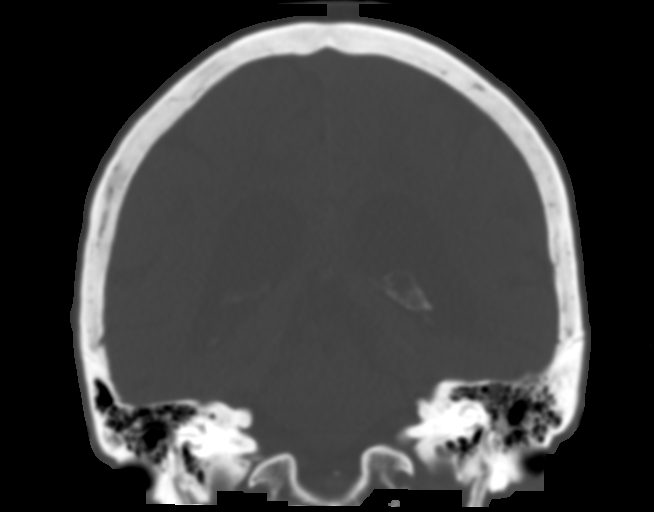
[im 38/64  bone]
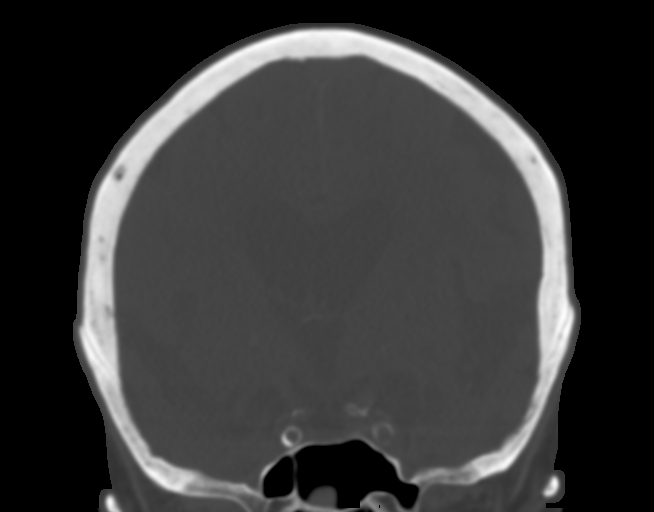

[Series 6: c spine bone · axial · 0.33mm/px · z∈[+216,+306]mm · 4 of 77 slices shown, 5 images]
[im 16/77  soft-tissue]
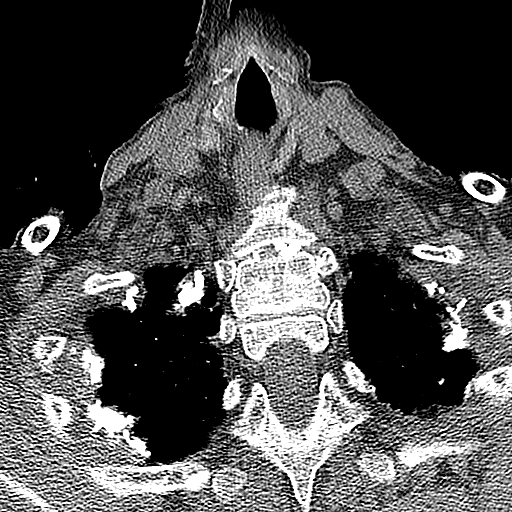
[im 16/77  bone]
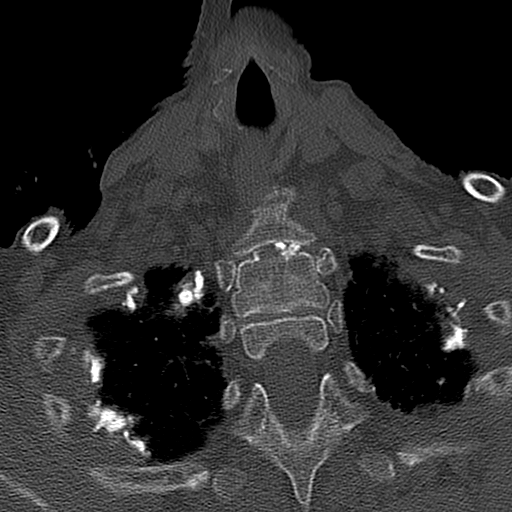
[im 31/77  bone]
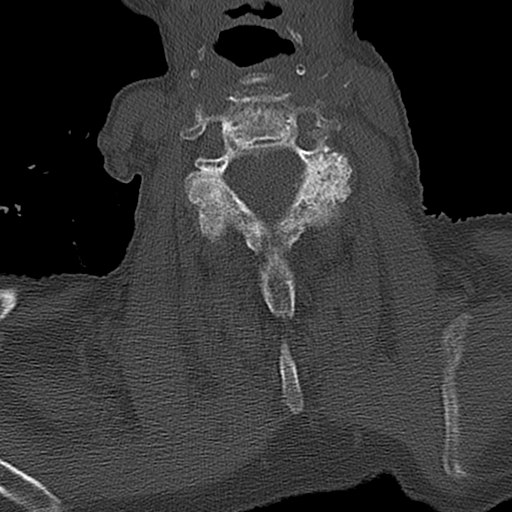
[im 46/77  bone]
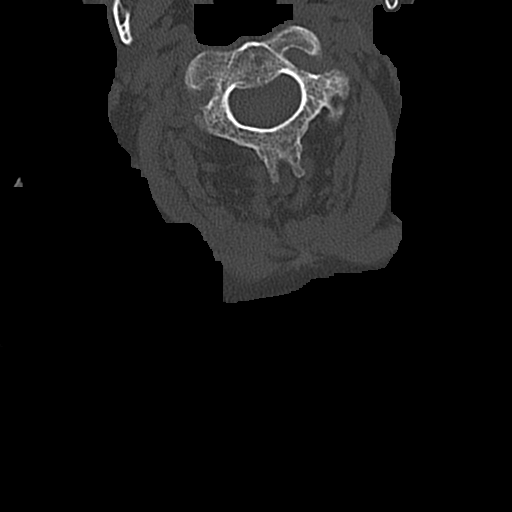
[im 61/77  bone]
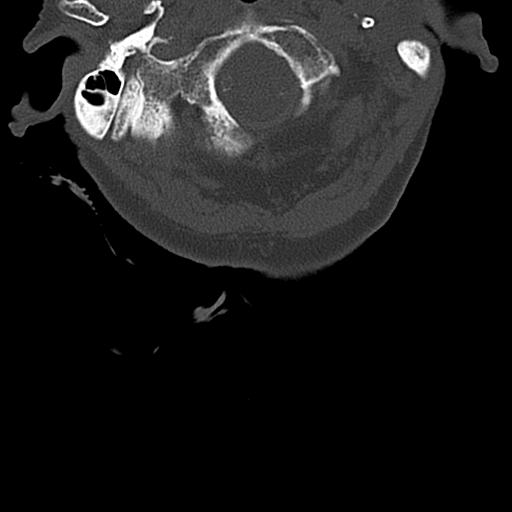

[Series 7: c spine soft · axial · 0.33mm/px · z∈[+218,+250]mm · 2 of 81 slices shown]
[im 17/81  soft-tissue]
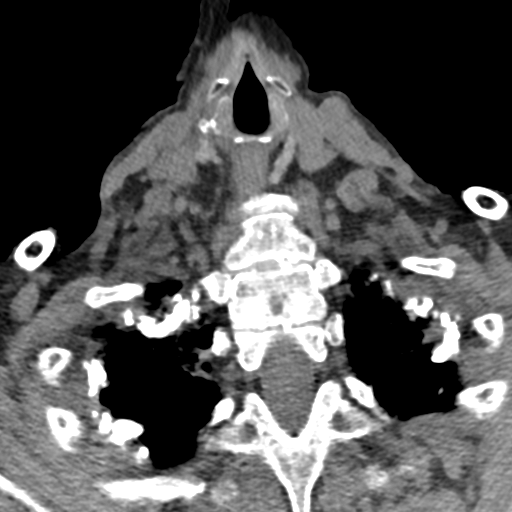
[im 33/81  soft-tissue]
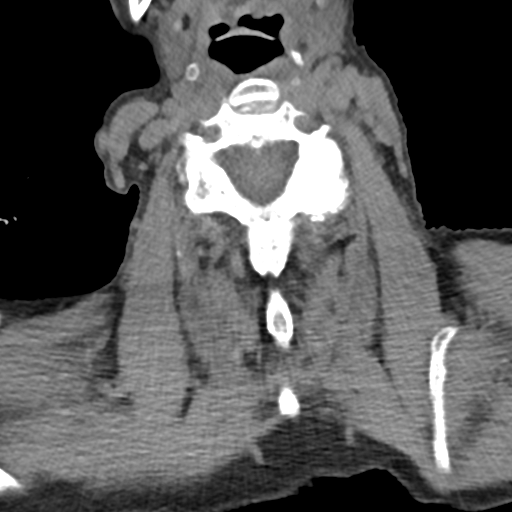

[Series 8: sagittal bone · sagittal · 0.23mm/px · 5 of 54 slices shown]
[im 9/54  bone]
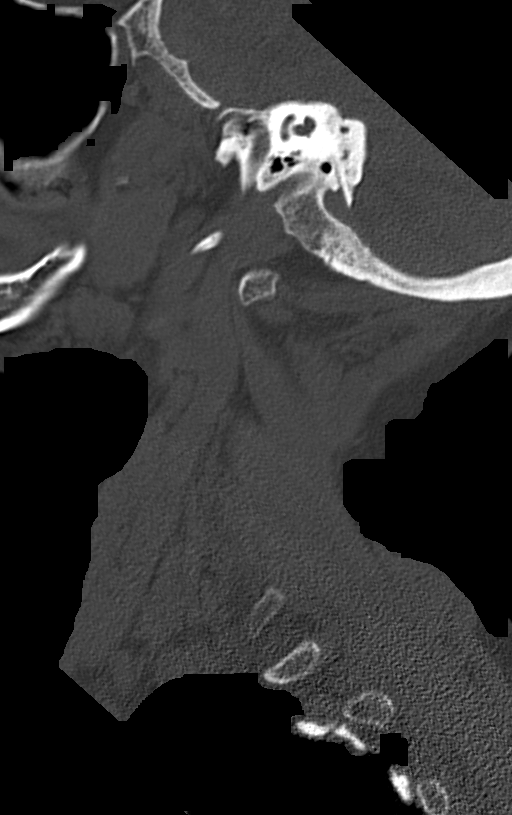
[im 18/54  bone]
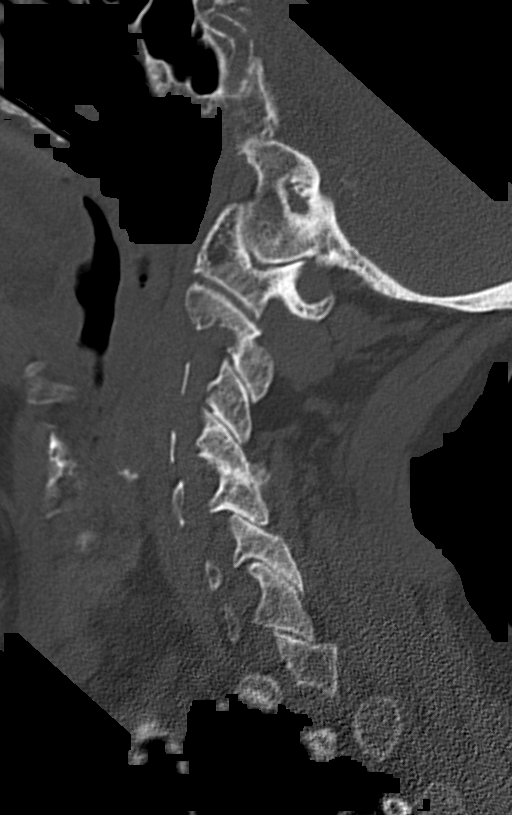
[im 27/54  bone]
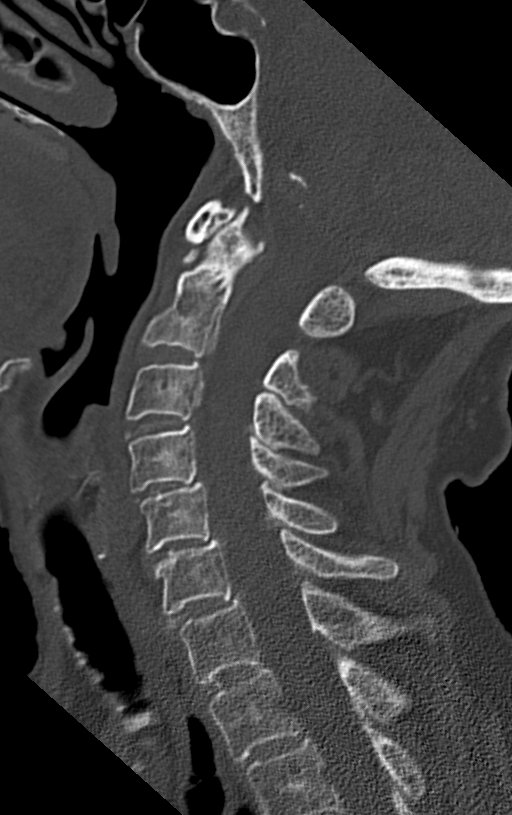
[im 36/54  bone]
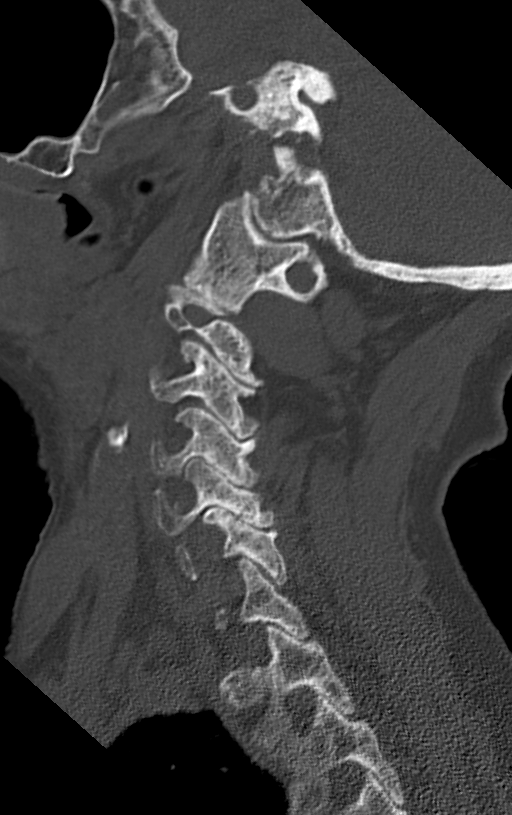
[im 45/54  bone]
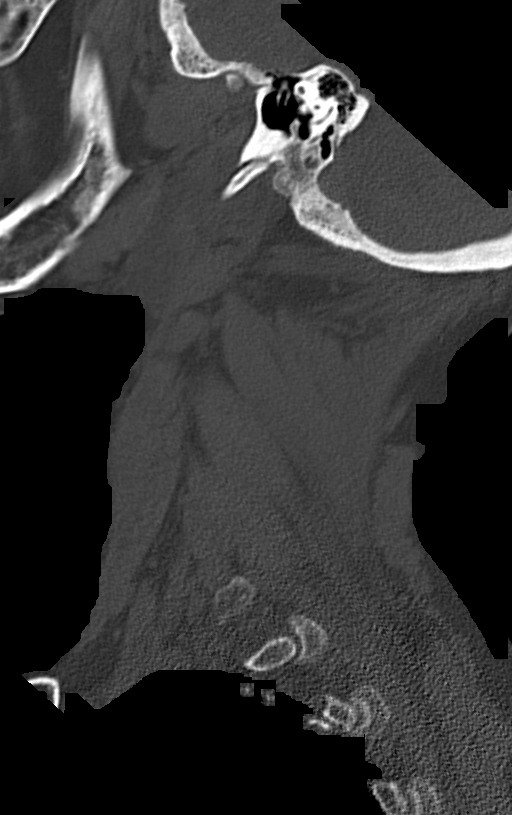

[14 of 33 positions shown; findings below may reference images not displayed]

FINDINGS: CT HEAD FINDINGS

Brain:

There is no intracranial hemorrhage, mass or evidence of acute
infarction. There is moderate generalized atrophy. There is moderate
chronic microvascular ischemic change. There is no significant
extra-axial fluid collection.

No acute intracranial findings are evident.

Vascular: No hyperdense vessel or unexpected calcification.

Skull: Normal. Negative for fracture or focal lesion.

Sinuses/Orbits: No acute finding.

Other: Right occipital scalp hematoma.

CT CERVICAL SPINE FINDINGS

Alignment: No traumatic malalignment. Slight degenerative
spondylolisthesis at C4-5, C5-6 and C6-7.

Skull base and vertebrae: No acute fracture. No primary bone lesion
or focal pathologic process.

Soft tissues and spinal canal: No prevertebral fluid or swelling. No
visible canal hematoma.

Disc levels: Mild degenerative disc changes from C4 through C7.
Moderate facet arthritis. Facet articulations are intact.

Upper chest: No acute findings.

Other: None
IMPRESSION: 1. No acute intracranial findings. There is moderate generalized
atrophy and chronic appearing white matter hypodensities which
likely represent small vessel ischemic disease.
2. Negative for acute cervical spine fracture.
3. Right occipital scalp hematoma.

## 2018-11-29 DIAGNOSIS — E039 Hypothyroidism, unspecified: Secondary | ICD-10-CM | POA: Diagnosis not present

## 2018-11-29 DIAGNOSIS — N183 Chronic kidney disease, stage 3 (moderate): Secondary | ICD-10-CM | POA: Diagnosis not present

## 2018-11-29 DIAGNOSIS — I10 Essential (primary) hypertension: Secondary | ICD-10-CM

## 2018-11-29 DIAGNOSIS — G3189 Other specified degenerative diseases of nervous system: Secondary | ICD-10-CM

## 2018-11-29 DIAGNOSIS — F39 Unspecified mood [affective] disorder: Secondary | ICD-10-CM | POA: Diagnosis not present

## 2019-01-14 DIAGNOSIS — I10 Essential (primary) hypertension: Secondary | ICD-10-CM | POA: Diagnosis not present

## 2019-01-14 DIAGNOSIS — N183 Chronic kidney disease, stage 3 unspecified: Secondary | ICD-10-CM | POA: Diagnosis not present

## 2019-01-14 DIAGNOSIS — F39 Unspecified mood [affective] disorder: Secondary | ICD-10-CM | POA: Diagnosis not present

## 2019-01-14 DIAGNOSIS — G3109 Other frontotemporal dementia: Secondary | ICD-10-CM | POA: Diagnosis not present

## 2019-03-21 DIAGNOSIS — I10 Essential (primary) hypertension: Secondary | ICD-10-CM

## 2019-03-21 DIAGNOSIS — G3109 Other frontotemporal dementia: Secondary | ICD-10-CM

## 2019-03-21 DIAGNOSIS — N183 Chronic kidney disease, stage 3 unspecified: Secondary | ICD-10-CM

## 2019-03-21 DIAGNOSIS — F0390 Unspecified dementia without behavioral disturbance: Secondary | ICD-10-CM

## 2019-03-21 DIAGNOSIS — F39 Unspecified mood [affective] disorder: Secondary | ICD-10-CM

## 2019-05-26 DIAGNOSIS — N183 Chronic kidney disease, stage 3 unspecified: Secondary | ICD-10-CM

## 2019-05-26 DIAGNOSIS — I10 Essential (primary) hypertension: Secondary | ICD-10-CM

## 2019-05-26 DIAGNOSIS — F39 Unspecified mood [affective] disorder: Secondary | ICD-10-CM

## 2019-05-26 DIAGNOSIS — G3109 Other frontotemporal dementia: Secondary | ICD-10-CM

## 2019-07-14 DIAGNOSIS — R451 Restlessness and agitation: Secondary | ICD-10-CM

## 2019-07-25 DIAGNOSIS — N183 Chronic kidney disease, stage 3 unspecified: Secondary | ICD-10-CM

## 2019-07-25 DIAGNOSIS — F39 Unspecified mood [affective] disorder: Secondary | ICD-10-CM

## 2019-07-25 DIAGNOSIS — G3109 Other frontotemporal dementia: Secondary | ICD-10-CM

## 2019-07-25 DIAGNOSIS — I10 Essential (primary) hypertension: Secondary | ICD-10-CM

## 2019-07-25 DIAGNOSIS — E039 Hypothyroidism, unspecified: Secondary | ICD-10-CM | POA: Diagnosis not present

## 2019-07-28 DIAGNOSIS — F39 Unspecified mood [affective] disorder: Secondary | ICD-10-CM | POA: Diagnosis not present

## 2019-08-18 DIAGNOSIS — G3109 Other frontotemporal dementia: Secondary | ICD-10-CM

## 2019-08-25 DIAGNOSIS — R4181 Age-related cognitive decline: Secondary | ICD-10-CM | POA: Diagnosis not present

## 2019-08-25 DIAGNOSIS — G3109 Other frontotemporal dementia: Secondary | ICD-10-CM | POA: Diagnosis not present

## 2019-09-04 DEATH — deceased
